# Patient Record
Sex: Female | Born: 1965 | Race: Black or African American | Hispanic: No | Marital: Single | Smoking: Former smoker
Health system: Southern US, Community
[De-identification: ages and names within clinical notes are randomized; demographics above are authoritative.]

## PROBLEM LIST (undated history)

## (undated) DIAGNOSIS — D649 Anemia, unspecified: Secondary | ICD-10-CM

## (undated) DIAGNOSIS — J449 Chronic obstructive pulmonary disease, unspecified: Secondary | ICD-10-CM

## (undated) DIAGNOSIS — F419 Anxiety disorder, unspecified: Secondary | ICD-10-CM

## (undated) DIAGNOSIS — F32A Depression, unspecified: Secondary | ICD-10-CM

## (undated) DIAGNOSIS — E785 Hyperlipidemia, unspecified: Secondary | ICD-10-CM

## (undated) DIAGNOSIS — R51 Headache: Secondary | ICD-10-CM

## (undated) DIAGNOSIS — R519 Headache, unspecified: Secondary | ICD-10-CM

## (undated) DIAGNOSIS — G4730 Sleep apnea, unspecified: Secondary | ICD-10-CM

## (undated) DIAGNOSIS — I10 Essential (primary) hypertension: Secondary | ICD-10-CM

## (undated) HISTORY — PX: BACK SURGERY: SHX140

## (undated) HISTORY — DX: Anxiety disorder, unspecified: F41.9

## (undated) HISTORY — DX: Depression, unspecified: F32.A

## (undated) HISTORY — DX: Hyperlipidemia, unspecified: E78.5

## (undated) HISTORY — PX: DILATION AND CURETTAGE OF UTERUS: SHX78

---

## 2015-06-08 ENCOUNTER — Encounter: Payer: Self-pay | Admitting: *Deleted

## 2015-06-08 ENCOUNTER — Other Ambulatory Visit: Payer: Self-pay

## 2015-06-08 DIAGNOSIS — I10 Essential (primary) hypertension: Secondary | ICD-10-CM

## 2015-06-08 NOTE — Patient Instructions (Addendum)
  Your procedure is scheduled on: 06-19-15 (TUESDAY) Report to [facility] SAME DAY SURGERY 2ND FLOOR To find out your arrival time please call [phone] between 1PM - 3PM on 06-18-15 [facility](MONDAY)  Remember: Instructions that are not followed completely may result in serious medical risk, up to and including death, or upon the discretion of your surgeon and anesthesiologist your surgery may need to be rescheduled.    _X___ 1. Do not eat food or drink liquids after midnight. No gum chewing or hard candies.     _X___ 2. No Alcohol for 24 hours before or after surgery.   ____ 3. Bring all medications with you on the day of surgery if instructed.    _X___ 4. Notify your doctor if there is any change in your medical condition     (cold, fever, infections).     Do not wear jewelry, make-up, hairpins, clips or nail polish.  Do not wear lotions, powders, or perfumes. You may wear deodorant.  Do not shave 48 hours prior to surgery. Men may shave face and neck.  Do not bring valuables to the hospital.    [facility] is not responsible for any belongings or valuables.               Contacts, dentures or bridgework may not be worn into surgery.  Leave your suitcase in the car. After surgery it may be brought to your room.  For patients admitted to the hospital, discharge time is determined by your treatment team.   Patients discharged the day of surgery will not be allowed to drive home.   Please read over the following fact sheets that you were given:      __X__ Take these medicines the morning of surgery with A SIP OF WATER:    1. AMLODIPINE  2. ATORVASTATIN  3. ZOLOFT  4. MAY TAKE OXYCODONE IF NEEDED  5.  6.  ____ Fleet Enema (as directed)   _X___ Use CHG Soap as directed  ____ Use inhalers on the day of surgery  ____ Stop metformin 2 days prior to surgery    ____ Take 1/2 of usual insulin dose the night before surgery and none on the morning of surgery.   ____ Stop  Coumadin/Plavix/aspirin-N/A  ____ Stop Anti-inflammatories-STOP MOTRIN NOW-NO NSAIDS OR ASA PRODUCTS-OXYCODONE OK TO CONTINUE   ____ Stop supplements until after surgery.    _X___ Bring C-Pap to the hospital.

## 2015-06-12 ENCOUNTER — Encounter
Admission: RE | Admit: 2015-06-12 | Discharge: 2015-06-12 | Disposition: A | Discharge status: Home / Self Care | Payer: Medicaid Other | Source: Ambulatory Visit | Attending: Anesthesiology | Admitting: Anesthesiology

## 2015-06-12 DIAGNOSIS — G5601 Carpal tunnel syndrome, right upper limb: Secondary | ICD-10-CM | POA: Insufficient documentation

## 2015-06-12 DIAGNOSIS — Z01818 Encounter for other preprocedural examination: Secondary | ICD-10-CM | POA: Diagnosis present

## 2015-06-12 LAB — POTASSIUM: Potassium: 3.2 mmol/L — ABNORMAL LOW (ref 3.5–5.1)

## 2015-06-15 ENCOUNTER — Other Ambulatory Visit: Payer: Medicaid Other

## 2015-06-15 DIAGNOSIS — Z888 Allergy status to other drugs, medicaments and biological substances status: Secondary | ICD-10-CM | POA: Diagnosis not present

## 2015-06-15 DIAGNOSIS — Z808 Family history of malignant neoplasm of other organs or systems: Secondary | ICD-10-CM | POA: Diagnosis not present

## 2015-06-15 DIAGNOSIS — Z818 Family history of other mental and behavioral disorders: Secondary | ICD-10-CM | POA: Diagnosis not present

## 2015-06-15 DIAGNOSIS — G4730 Sleep apnea, unspecified: Secondary | ICD-10-CM | POA: Diagnosis not present

## 2015-06-15 DIAGNOSIS — M545 Low back pain: Secondary | ICD-10-CM | POA: Diagnosis not present

## 2015-06-15 DIAGNOSIS — Z79899 Other long term (current) drug therapy: Secondary | ICD-10-CM | POA: Diagnosis not present

## 2015-06-15 DIAGNOSIS — Z8249 Family history of ischemic heart disease and other diseases of the circulatory system: Secondary | ICD-10-CM | POA: Diagnosis not present

## 2015-06-15 DIAGNOSIS — Z841 Family history of disorders of kidney and ureter: Secondary | ICD-10-CM | POA: Diagnosis not present

## 2015-06-15 DIAGNOSIS — Z833 Family history of diabetes mellitus: Secondary | ICD-10-CM | POA: Diagnosis not present

## 2015-06-15 DIAGNOSIS — I10 Essential (primary) hypertension: Secondary | ICD-10-CM | POA: Diagnosis not present

## 2015-06-15 DIAGNOSIS — G8929 Other chronic pain: Secondary | ICD-10-CM | POA: Diagnosis not present

## 2015-06-15 DIAGNOSIS — F419 Anxiety disorder, unspecified: Secondary | ICD-10-CM | POA: Diagnosis not present

## 2015-06-15 DIAGNOSIS — Z90710 Acquired absence of both cervix and uterus: Secondary | ICD-10-CM | POA: Diagnosis not present

## 2015-06-15 DIAGNOSIS — D649 Anemia, unspecified: Secondary | ICD-10-CM | POA: Diagnosis not present

## 2015-06-15 DIAGNOSIS — E785 Hyperlipidemia, unspecified: Secondary | ICD-10-CM | POA: Diagnosis not present

## 2015-06-15 DIAGNOSIS — Z981 Arthrodesis status: Secondary | ICD-10-CM | POA: Diagnosis not present

## 2015-06-15 DIAGNOSIS — E669 Obesity, unspecified: Secondary | ICD-10-CM | POA: Diagnosis not present

## 2015-06-15 DIAGNOSIS — Z820 Family history of epilepsy and other diseases of the nervous system: Secondary | ICD-10-CM | POA: Diagnosis not present

## 2015-06-15 DIAGNOSIS — Z6838 Body mass index (BMI) 38.0-38.9, adult: Secondary | ICD-10-CM | POA: Diagnosis not present

## 2015-06-15 DIAGNOSIS — Z800 Family history of malignant neoplasm of digestive organs: Secondary | ICD-10-CM | POA: Diagnosis not present

## 2015-06-15 DIAGNOSIS — Z823 Family history of stroke: Secondary | ICD-10-CM | POA: Diagnosis not present

## 2015-06-15 DIAGNOSIS — E781 Pure hyperglyceridemia: Secondary | ICD-10-CM | POA: Diagnosis not present

## 2015-06-15 DIAGNOSIS — G5601 Carpal tunnel syndrome, right upper limb: Secondary | ICD-10-CM | POA: Diagnosis present

## 2015-06-15 DIAGNOSIS — G43909 Migraine, unspecified, not intractable, without status migrainosus: Secondary | ICD-10-CM | POA: Diagnosis not present

## 2015-06-15 DIAGNOSIS — F329 Major depressive disorder, single episode, unspecified: Secondary | ICD-10-CM | POA: Diagnosis not present

## 2015-06-15 DIAGNOSIS — M1990 Unspecified osteoarthritis, unspecified site: Secondary | ICD-10-CM | POA: Diagnosis not present

## 2015-06-15 NOTE — Pre-Procedure Instructions (Signed)
Faxed over to dr menz's office on 06-13-15  the 3.2 potassium along with calling the office and leaving Hope a message on her voicemail.  The pt said on 06-14-15 that no one has called her regarding her low potassium so I called the office back and spoke with Tiffany who took the message that pts potassium was low and that a supplement needed to be started.  She states she will pass this message on.

## 2015-06-18 ENCOUNTER — Other Ambulatory Visit: Payer: Medicaid Other

## 2015-06-18 NOTE — Pre-Procedure Instructions (Signed)
CALLED DR Maisie FusHOMAS ABOUT ABNORMAL EKG-HE WANTS CARDIAC CLEARANCE-[facility] AND NOTIFIED HER OF THIS-FAXED CLEARANCE NOTE ALONG WITH EKG OVER TO CINDY AND SHE IS GOING TO TRY AND GET [facility]

## 2015-06-19 ENCOUNTER — Encounter: Payer: Self-pay | Admitting: *Deleted

## 2015-06-19 ENCOUNTER — Ambulatory Visit: Payer: Medicaid Other | Admitting: Anesthesiology

## 2015-06-19 ENCOUNTER — Encounter
Admission: RE | Disposition: A | Discharge status: Home / Self Care | Payer: Self-pay | Source: Ambulatory Visit | Attending: Orthopedic Surgery

## 2015-06-19 ENCOUNTER — Ambulatory Visit
Admission: RE | Admit: 2015-06-19 | Discharge: 2015-06-19 | Disposition: A | Discharge status: Home / Self Care | Payer: Medicaid Other | Source: Ambulatory Visit | Attending: Orthopedic Surgery | Admitting: Orthopedic Surgery

## 2015-06-19 DIAGNOSIS — Z79899 Other long term (current) drug therapy: Secondary | ICD-10-CM | POA: Insufficient documentation

## 2015-06-19 DIAGNOSIS — Z888 Allergy status to other drugs, medicaments and biological substances status: Secondary | ICD-10-CM | POA: Insufficient documentation

## 2015-06-19 DIAGNOSIS — M1990 Unspecified osteoarthritis, unspecified site: Secondary | ICD-10-CM | POA: Insufficient documentation

## 2015-06-19 DIAGNOSIS — Z90710 Acquired absence of both cervix and uterus: Secondary | ICD-10-CM | POA: Insufficient documentation

## 2015-06-19 DIAGNOSIS — E669 Obesity, unspecified: Secondary | ICD-10-CM | POA: Insufficient documentation

## 2015-06-19 DIAGNOSIS — G8929 Other chronic pain: Secondary | ICD-10-CM | POA: Insufficient documentation

## 2015-06-19 DIAGNOSIS — I10 Essential (primary) hypertension: Secondary | ICD-10-CM | POA: Insufficient documentation

## 2015-06-19 DIAGNOSIS — E781 Pure hyperglyceridemia: Secondary | ICD-10-CM | POA: Insufficient documentation

## 2015-06-19 DIAGNOSIS — G43909 Migraine, unspecified, not intractable, without status migrainosus: Secondary | ICD-10-CM | POA: Insufficient documentation

## 2015-06-19 DIAGNOSIS — F329 Major depressive disorder, single episode, unspecified: Secondary | ICD-10-CM | POA: Insufficient documentation

## 2015-06-19 DIAGNOSIS — M545 Low back pain: Secondary | ICD-10-CM | POA: Insufficient documentation

## 2015-06-19 DIAGNOSIS — Z8249 Family history of ischemic heart disease and other diseases of the circulatory system: Secondary | ICD-10-CM | POA: Insufficient documentation

## 2015-06-19 DIAGNOSIS — Z833 Family history of diabetes mellitus: Secondary | ICD-10-CM | POA: Insufficient documentation

## 2015-06-19 DIAGNOSIS — E785 Hyperlipidemia, unspecified: Secondary | ICD-10-CM | POA: Insufficient documentation

## 2015-06-19 DIAGNOSIS — F419 Anxiety disorder, unspecified: Secondary | ICD-10-CM | POA: Insufficient documentation

## 2015-06-19 DIAGNOSIS — Z808 Family history of malignant neoplasm of other organs or systems: Secondary | ICD-10-CM | POA: Insufficient documentation

## 2015-06-19 DIAGNOSIS — Z981 Arthrodesis status: Secondary | ICD-10-CM | POA: Insufficient documentation

## 2015-06-19 DIAGNOSIS — Z841 Family history of disorders of kidney and ureter: Secondary | ICD-10-CM | POA: Insufficient documentation

## 2015-06-19 DIAGNOSIS — D649 Anemia, unspecified: Secondary | ICD-10-CM | POA: Insufficient documentation

## 2015-06-19 DIAGNOSIS — Z800 Family history of malignant neoplasm of digestive organs: Secondary | ICD-10-CM | POA: Insufficient documentation

## 2015-06-19 DIAGNOSIS — Z820 Family history of epilepsy and other diseases of the nervous system: Secondary | ICD-10-CM | POA: Insufficient documentation

## 2015-06-19 DIAGNOSIS — G5601 Carpal tunnel syndrome, right upper limb: Secondary | ICD-10-CM | POA: Diagnosis not present

## 2015-06-19 DIAGNOSIS — Z818 Family history of other mental and behavioral disorders: Secondary | ICD-10-CM | POA: Insufficient documentation

## 2015-06-19 DIAGNOSIS — Z823 Family history of stroke: Secondary | ICD-10-CM | POA: Insufficient documentation

## 2015-06-19 DIAGNOSIS — Z6838 Body mass index (BMI) 38.0-38.9, adult: Secondary | ICD-10-CM | POA: Insufficient documentation

## 2015-06-19 DIAGNOSIS — G4730 Sleep apnea, unspecified: Secondary | ICD-10-CM | POA: Insufficient documentation

## 2015-06-19 HISTORY — DX: Essential (primary) hypertension: I10

## 2015-06-19 HISTORY — DX: Anemia, unspecified: D64.9

## 2015-06-19 HISTORY — DX: Headache: R51

## 2015-06-19 HISTORY — DX: Sleep apnea, unspecified: G47.30

## 2015-06-19 HISTORY — DX: Headache, unspecified: R51.9

## 2015-06-19 HISTORY — PX: CARPAL TUNNEL RELEASE: SHX101

## 2015-06-19 LAB — POCT I-STAT 4, (NA,K, GLUC, HGB,HCT)
Glucose, Bld: 88 mg/dL (ref 65–99)
HEMATOCRIT: 39 % (ref 36.0–46.0)
HEMOGLOBIN: 13.3 g/dL (ref 12.0–15.0)
POTASSIUM: 3.5 mmol/L (ref 3.5–5.1)
SODIUM: 140 mmol/L (ref 135–145)

## 2015-06-19 LAB — POCT PREGNANCY, URINE: Preg Test, Ur: NEGATIVE

## 2015-06-19 SURGERY — CARPAL TUNNEL RELEASE
Anesthesia: General | Laterality: Right

## 2015-06-19 MED ORDER — ONDANSETRON HCL 4 MG/2ML IJ SOLN
INTRAMUSCULAR | Status: DC | PRN
  Administered 2015-06-19: 4 mg via INTRAVENOUS

## 2015-06-19 MED ORDER — BUPIVACAINE HCL (PF) 0.5 % IJ SOLN
INTRAMUSCULAR | Status: AC
  Filled 2015-06-19: qty 30

## 2015-06-19 MED ORDER — HYDROCODONE-ACETAMINOPHEN 5-325 MG PO TABS: 1.0000 | ORAL_TABLET | Freq: Four times a day (QID) | ORAL | Status: DC | PRN

## 2015-06-19 MED ORDER — ONDANSETRON HCL 4 MG/2ML IJ SOLN: 4.0000 mg | Freq: Once | INTRAMUSCULAR | Status: DC | PRN

## 2015-06-19 MED ORDER — FENTANYL CITRATE (PF) 100 MCG/2ML IJ SOLN: 25.0000 ug | INTRAMUSCULAR | Status: DC | PRN

## 2015-06-19 MED ORDER — FAMOTIDINE 20 MG PO TABS
ORAL_TABLET | ORAL | Status: AC
  Filled 2015-06-19: qty 1

## 2015-06-19 MED ORDER — LACTATED RINGERS IV SOLN
INTRAVENOUS | Status: DC
Start: 2015-06-19 — End: 2015-06-19
  Administered 2015-06-19 (×2): via INTRAVENOUS

## 2015-06-19 MED ORDER — FENTANYL CITRATE (PF) 100 MCG/2ML IJ SOLN
INTRAMUSCULAR | Status: DC | PRN
  Administered 2015-06-19: 100 ug via INTRAVENOUS

## 2015-06-19 MED ORDER — FAMOTIDINE 20 MG PO TABS
20.0000 mg | ORAL_TABLET | Freq: Once | ORAL | Status: AC
  Administered 2015-06-19: 20 mg via ORAL

## 2015-06-19 MED ORDER — BUPIVACAINE HCL 0.5 % IJ SOLN
INTRAMUSCULAR | Status: DC | PRN
  Administered 2015-06-19: 10 mL

## 2015-06-19 MED ORDER — PROPOFOL 10 MG/ML IV BOLUS
INTRAVENOUS | Status: DC | PRN
  Administered 2015-06-19: 200 mg via INTRAVENOUS

## 2015-06-19 MED ORDER — DEXAMETHASONE SODIUM PHOSPHATE 4 MG/ML IJ SOLN
INTRAMUSCULAR | Status: DC | PRN
  Administered 2015-06-19: 10 mg via INTRAVENOUS

## 2015-06-19 MED ORDER — MIDAZOLAM HCL 2 MG/2ML IJ SOLN
INTRAMUSCULAR | Status: DC | PRN
  Administered 2015-06-19: 2 mg via INTRAVENOUS

## 2015-06-19 SURGICAL SUPPLY — 25 items
BANDAGE ACE 3X5.8 VEL STRL LF (GAUZE/BANDAGES/DRESSINGS) ×3 IMPLANT
BNDG ESMARK 4X12 TAN STRL LF (GAUZE/BANDAGES/DRESSINGS) ×3 IMPLANT
CANISTER SUCT 1200ML W/VALVE (MISCELLANEOUS) ×3 IMPLANT
CHLORAPREP W/TINT 26ML (MISCELLANEOUS) ×3 IMPLANT
ELECT CAUTERY NEEDLE 2.0 MIC (NEEDLE) ×3 IMPLANT
ELECT CAUTERY NEEDLE TIP 1.0 (MISCELLANEOUS) ×3
ELECTRODE CAUTERY NEDL TIP 1.0 (MISCELLANEOUS) ×1 IMPLANT
GAUZE PETRO XEROFOAM 1X8 (MISCELLANEOUS) ×3 IMPLANT
GAUZE SPONGE 4X4 12PLY STRL (GAUZE/BANDAGES/DRESSINGS) ×3 IMPLANT
GLOVE BIOGEL PI IND STRL 9 (GLOVE) ×1 IMPLANT
GLOVE BIOGEL PI INDICATOR 9 (GLOVE) ×2
GLOVE SURG ORTHO 9.0 STRL STRW (GLOVE) ×3 IMPLANT
GOWN SPECIALTY ULTRA XL (MISCELLANEOUS) ×3 IMPLANT
GOWN STRL REUS W/ TWL LRG LVL3 (GOWN DISPOSABLE) ×1 IMPLANT
GOWN STRL REUS W/TWL 2XL LVL3 (GOWN DISPOSABLE) ×3 IMPLANT
GOWN STRL REUS W/TWL LRG LVL3 (GOWN DISPOSABLE) ×2
KIT RM TURNOVER STRD PROC AR (KITS) ×3 IMPLANT
NS IRRIG 500ML POUR BTL (IV SOLUTION) ×3 IMPLANT
PACK EXTREMITY ARMC (MISCELLANEOUS) ×3 IMPLANT
PAD CAST CTTN 4X4 STRL (SOFTGOODS) ×1 IMPLANT
PADDING CAST COTTON 4X4 STRL (SOFTGOODS) ×2
STOCKINETTE STRL 4IN 9604848 (GAUZE/BANDAGES/DRESSINGS) ×3 IMPLANT
SUT ETHILON 4-0 (SUTURE) ×2
SUT ETHILON 4-0 FS2 18XMFL BLK (SUTURE) ×1
SUTURE ETHLN 4-0 FS2 18XMF BLK (SUTURE) ×1 IMPLANT

## 2015-06-19 NOTE — Anesthesia Preprocedure Evaluation (Signed)
Anesthesia Evaluation  Patient identified by MRN, date of birth, ID band Patient awake    Reviewed: Allergy & Precautions, NPO status , Patient's Chart, lab work & pertinent test results  History of Anesthesia Complications Negative for: history of anesthetic complications  Airway Mallampati: III       Dental  (+) Upper Dentures, Lower Dentures   Pulmonary neg pulmonary ROS, sleep apnea and Continuous Positive Airway Pressure Ventilation , Current Smoker,           Cardiovascular hypertension, Pt. on medications      Neuro/Psych  Headaches,    GI/Hepatic negative GI ROS, Neg liver ROS,   Endo/Other  negative endocrine ROS  Renal/GU negative Renal ROS     Musculoskeletal   Abdominal   Peds  Hematology  (+) anemia ,   Anesthesia Other Findings   Reproductive/Obstetrics                             Anesthesia Physical Anesthesia Plan  ASA: III  Anesthesia Plan: General   Post-op Pain Management:    Induction: Intravenous  Airway Management Planned: LMA  Additional Equipment:   Intra-op Plan:   Post-operative Plan:   Informed Consent: I have reviewed the patients History and Physical, chart, labs and discussed the procedure including the risks, benefits and alternatives for the proposed anesthesia with the patient or authorized representative who has indicated his/her understanding and acceptance.     Plan Discussed with:   Anesthesia Plan Comments:         Anesthesia Quick Evaluation

## 2015-06-19 NOTE — Discharge Instructions (Signed)
AMBULATORY SURGERY  DISCHARGE INSTRUCTIONS   1) The drugs that you were given will stay in your system until tomorrow so for the next 24 hours you should not:  A) Drive an automobile B) Make any legal decisions C) Drink any alcoholic beverage   2) You may resume regular meals tomorrow.  Today it is better to start with liquids and gradually work up to solid foods.  You may eat anything you prefer, but it is better to start with liquids, then soup and crackers, and gradually work up to solid foods.   3) Please notify your doctor immediately if you have any unusual bleeding, trouble breathing, redness and pain at the surgery site, drainage, fever, or pain not relieved by medication.    4) Additional Instructions:   Loosen Ace wrap prior to discharge and if fingers swell. Keep remaining bandage in place. Work fingers is much as possible     Please contact your physician with any problems or Same Day Surgery at [phone], Monday through Friday 6 am to 4 pm, or [place] at [facility] Main number at [phone].

## 2015-06-19 NOTE — Anesthesia Postprocedure Evaluation (Signed)
  Anesthesia Post-op Note  Patient: Courtney Carrillo  Procedure(s) Performed: Procedure(s): CARPAL TUNNEL RELEASE (Right)  Anesthesia type:General  Patient location: PACU  Post pain: Pain level controlled  Post assessment: Post-op Vital signs reviewed, Patient's Cardiovascular Status Stable, Respiratory Function Stable, Patent Airway and No signs of Nausea or vomiting  Post vital signs: Reviewed and stable  Last Vitals:  Filed Vitals:   06/19/15 1230  BP: 158/98  Pulse: 51  Temp:   Resp: 16    Level of consciousness: awake, alert  and patient cooperative  Complications: No apparent anesthesia complications

## 2015-06-19 NOTE — Transfer of Care (Signed)
Immediate Anesthesia Transfer of Care Note  Patient: Courtney Carrillo  Procedure(s) Performed: Procedure(s): CARPAL TUNNEL RELEASE (Right)  Patient Location: PACU  Anesthesia Type:General  Level of Consciousness: sedated  Airway & Oxygen Therapy: Patient Spontanous Breathing and Patient connected to face mask oxygen  Post-op Assessment: Report given to RN and Post -op Vital signs reviewed and stable  Post vital signs: Reviewed and stable  Last Vitals:  Filed Vitals:   06/19/15 0905  BP: 153/91  Pulse: 68  Temp: 36.8 C  Resp: 16    Complications: No apparent anesthesia complications

## 2015-06-19 NOTE — Anesthesia Procedure Notes (Signed)
Procedure Name: LMA Insertion Date/Time: 06/19/2015 10:25 AM Performed by: Junious SilkNOLES, Courtney Marcantonio Pre-anesthesia Checklist: Patient identified, Patient being monitored, Timeout performed, Emergency Drugs available and Suction available Patient Re-evaluated:Patient Re-evaluated prior to inductionOxygen Delivery Method: Circle system utilized Preoxygenation: Pre-oxygenation with 100% oxygen Intubation Type: IV induction Ventilation: Mask ventilation without difficulty LMA: LMA inserted Tube type: Oral Number of attempts: 1 Placement Confirmation: positive ETCO2 and breath sounds checked- equal and bilateral Tube secured with: Tape Dental Injury: Teeth and Oropharynx as per pre-operative assessment

## 2015-06-19 NOTE — H&P (Signed)
Reviewed paper H+P, will be scanned into chart. No changes noted.  

## 2015-06-19 NOTE — Op Note (Signed)
06/19/2015  11:03 AM  PATIENT:  Courtney Carrillo  49 y.o. female  PRE-OPERATIVE DIAGNOSIS:  CARPAL TUNNEL SYNDROME right  POST-OPERATIVE DIAGNOSIS:  Same  PROCEDURE:  Procedure(s): CARPAL TUNNEL RELEASE (Right)  SURGEON: Leitha SchullerMichael J Abby Stines, MD  ASSISTANTS: None  ANESTHESIA:   general  EBL:  Total I/O In: 600 [I.V.:600] Out: 5 [Blood:5]  BLOOD ADMINISTERED:none  DRAINS: none   LOCAL MEDICATIONS USED:  MARCAINE     SPECIMEN:  No Specimen  DISPOSITION OF SPECIMEN:  N/A  COUNTS:  YES  TOURNIQUET:   12 minutes at 250 mmHg  IMPLANTS: None  DICTATION: .Dragon Dictation patient brought the operating room and after adequate general anesthesia was obtained, the right arm was prepped and draped in the sterile fashion. After patient identification and timeout procedures were completed tourniquet was raised to her 50 murmurs mercury. Approximately 2 half and a medial incision made in line with ring metacarpal was made with incision down to the transverse carpal ligament. There was an aberrant muscle covering this which was elevated. The transcarpal ligament was and opened and a vascular hemostat placed deep to protect and I structures releases carried distally to there is fat noted around the nerve proximally releases carried out proximally a centimeter proximal to the wrist flexion crease at which point there is good vascular blush compression appeared to be at the level of the wrist flexion crease. There was some ROS constriction of the nerve root good vascularity after release. There no masses within the carpal tunnel is mild flexor tenosynovitis. Wound was irrigated and then infiltrated with 10 cc half percent Sensorcaine for postop analgesia, wound closed with simple interrupted 5-0 nylon skin sutures. Xeroform 4 x 4's web roll and Ace wrap applied  PLAN OF CARE: Discharge to home after PACU  PATIENT DISPOSITION:  PACU - hemodynamically stable.

## 2020-05-29 ENCOUNTER — Other Ambulatory Visit: Payer: Self-pay

## 2020-05-29 ENCOUNTER — Ambulatory Visit: Payer: Self-pay

## 2020-05-29 ENCOUNTER — Ambulatory Visit (LOCAL_COMMUNITY_HEALTH_CENTER): Payer: Medicaid Other

## 2020-05-29 DIAGNOSIS — Z23 Encounter for immunization: Secondary | ICD-10-CM

## 2020-05-29 NOTE — Progress Notes (Signed)
Pt here with son for flu vaccine. Tolerated flu vaccine well today. Updated [facility] copy given. Jerel Shepherd, RN

## 2020-05-29 NOTE — Progress Notes (Addendum)
Pt to be seen by Francis Dowse, RN.

## 2020-08-16 ENCOUNTER — Other Ambulatory Visit: Payer: Self-pay

## 2020-08-16 ENCOUNTER — Ambulatory Visit (INDEPENDENT_AMBULATORY_CARE_PROVIDER_SITE_OTHER): Payer: Medicaid Other | Admitting: Family Medicine

## 2020-08-16 ENCOUNTER — Encounter: Payer: Self-pay | Admitting: Family Medicine

## 2020-08-16 VITALS — BP 158/79 | HR 71 | Temp 97.1°F | Ht 64.0 in | Wt 211.2 lb

## 2020-08-16 DIAGNOSIS — F419 Anxiety disorder, unspecified: Secondary | ICD-10-CM | POA: Diagnosis not present

## 2020-08-16 DIAGNOSIS — I10 Essential (primary) hypertension: Secondary | ICD-10-CM | POA: Diagnosis not present

## 2020-08-16 DIAGNOSIS — R739 Hyperglycemia, unspecified: Secondary | ICD-10-CM

## 2020-08-16 DIAGNOSIS — E785 Hyperlipidemia, unspecified: Secondary | ICD-10-CM | POA: Diagnosis not present

## 2020-08-16 DIAGNOSIS — F32A Depression, unspecified: Secondary | ICD-10-CM | POA: Insufficient documentation

## 2020-08-16 DIAGNOSIS — G8929 Other chronic pain: Secondary | ICD-10-CM | POA: Insufficient documentation

## 2020-08-16 DIAGNOSIS — Z7689 Persons encountering health services in other specified circumstances: Secondary | ICD-10-CM | POA: Insufficient documentation

## 2020-08-16 DIAGNOSIS — M549 Dorsalgia, unspecified: Secondary | ICD-10-CM

## 2020-08-16 DIAGNOSIS — F411 Generalized anxiety disorder: Secondary | ICD-10-CM | POA: Insufficient documentation

## 2020-08-16 MED ORDER — HYDROCHLOROTHIAZIDE 25 MG PO TABS: 25.0000 mg | ORAL_TABLET | Freq: Every day | ORAL | 1 refills | Status: DC

## 2020-08-16 MED ORDER — IBUPROFEN 800 MG PO TABS: 800.0000 mg | ORAL_TABLET | Freq: Three times a day (TID) | ORAL | 0 refills | Status: DC | PRN

## 2020-08-16 MED ORDER — AMLODIPINE BESYLATE 5 MG PO TABS: 5.0000 mg | ORAL_TABLET | Freq: Every day | ORAL | 1 refills | Status: DC

## 2020-08-16 MED ORDER — ATORVASTATIN CALCIUM 40 MG PO TABS: 40.0000 mg | ORAL_TABLET | ORAL | 3 refills | Status: DC

## 2020-08-16 NOTE — Assessment & Plan Note (Signed)
Uncontrolled hypertension.  BP is not at goal < 130/80.  Pt is not working on lifestyle modifications.  Taking medications tolerating well without side effects.  Complications:  HLD, Obesity  Plan: 1. RESTART on amlodipine 5mg  and hydrochlorothiazide 25mg  daily 2. Obtain labs in the next 1-2 [place]  3. Encouraged heart healthy diet and increasing exercise to 30 minutes most days of the week, going no more than 2 days in a row without exercise. 4. Check BP 1-2 x per week at home, keep log, and bring to clinic at next appointment. 5. Follow up 3 months.

## 2020-08-16 NOTE — Assessment & Plan Note (Signed)
Reports previously taking atorvastatin 40mg  daily for cholesterol management.  Will have labs drawn and restart on this prescription.  Sent to pharmacy on file.

## 2020-08-16 NOTE — Assessment & Plan Note (Signed)
New patient establishment at [facility] for primary care.  To have baseline labs drawn and RTC in 3 months for CPE

## 2020-08-16 NOTE — Patient Instructions (Signed)
I have sent in refills to your pharmacy on file.  Try to get exercise a minimum of 30 minutes per day at least 5 days per week as well as  adequate water intake all while measuring blood pressure a few times per week.  Keep a blood pressure log and bring back to clinic at your next visit.  If your readings are consistently over 130/80 to contact our office/send me a MyChart message and we will see you sooner.  Can try DASH and Mediterranean diet options, avoiding processed foods, lowering sodium intake, avoiding pork products, and eating a plant based diet for optimal health.  As we discussed, have your labs drawn in the next 1-2 [place] and we will contact you with the results.  We will plan to see you back in 3 months for your physical  You will receive a survey after today's visit either digitally by e-mail or paper by USPS mail. Your experiences and feedback matter to [place].  Please respond so we know how we are doing as we provide care for you.  Call us with any questions/concerns/needs.  It is my goal to be available to you for your health concerns.  Thanks for choosing me to be a partner in your healthcare needs!  Charlaine Dalton, FNP-C Family Nurse Practitioner [facility] Phone: [phone]

## 2020-08-16 NOTE — Progress Notes (Signed)
Subjective:    Patient ID: Courtney Carrillo, female    DOB: May 14, 1966, 55 y.o.   MRN: [id number]  Courtney Carrillo is a 55 y.o. female presenting on 08/16/2020 for Establish Care (Pt been off bp medication x 4 mths. Pt requesting refills for bpmeds, cholesterol medication and Ibuprofen 800MG  for chronic back pain.  Currently a patient at [facility] they manage her Anxiety & Depression.)   HPI  Courtney Carrillo presents to clinic as a new patient to establish for primary care.  She reports that her previous PCP was at [facility] in [place], [place].  Records will be requested.  Past medical, family, and surgical history reviewed w/ pt.  She has acute concerns today to restart on her medications for hypertension, high cholesterol and ibuprofen.  Reports she has been off of her medications for a few months.    No flowsheet data found.  Social History   Tobacco Use  . Smoking status: Current Every Day Smoker    Packs/day: 0.50    Years: 35.00    Pack years: 17.50    Types: Cigarettes  . Smokeless tobacco: Never Used  Substance Use Topics  . Alcohol use: No  . Drug use: Yes    Types: Marijuana    Review of Systems  Constitutional: Negative.   HENT: Negative.   Eyes: Negative.   Respiratory: Negative.   Cardiovascular: Negative.   Gastrointestinal: Negative.   Endocrine: Negative.   Genitourinary: Negative.   Musculoskeletal: Positive for back pain. Negative for arthralgias, gait problem, joint swelling, myalgias, neck pain and neck stiffness.  Skin: Negative.   Allergic/Immunologic: Negative.   Neurological: Negative.   Hematological: Negative.   Psychiatric/Behavioral: Negative.    Per HPI unless specifically indicated above     Objective:    BP (!) 158/79 (BP Location: Right Arm, Patient Position: Sitting, Cuff Size: Large)   Pulse 71   Temp (!) 97.1 F (36.2 C) (Temporal)   Ht 5\' 4"  (1.626 m)   Wt 211 lb 3.2 oz (95.8 kg)   BMI 36.25 kg/m   Wt  Readings from Last 3 Encounters:  08/16/20 211 lb 3.2 oz (95.8 kg)  06/19/15 230 lb (104.3 kg)    Physical Exam Vitals and nursing note reviewed.  Constitutional:      General: She is not in acute distress.    Appearance: Normal appearance. She is well-developed and well-groomed. She is obese. She is not ill-appearing or toxic-appearing.  HENT:     Head: Normocephalic and atraumatic.     Nose:     Comments: Lesia Sago is in place, covering mouth and nose. Eyes:     General: Lids are normal. Vision grossly intact.        Right eye: No discharge.        Left eye: No discharge.     Extraocular Movements: Extraocular movements intact.     Conjunctiva/sclera: Conjunctivae normal.     Pupils: Pupils are equal, round, and reactive to light.  Cardiovascular:     Rate and Rhythm: Normal rate and regular rhythm.     Pulses: Normal pulses.     Heart sounds: Normal heart sounds. No murmur heard. No friction rub. No gallop.   Pulmonary:     Effort: Pulmonary effort is normal. No respiratory distress.     Breath sounds: Normal breath sounds.  Musculoskeletal:     Lumbar back: No spasms. Negative right straight leg raise test and negative left straight leg  raise test.     Comments: Bilateral paraspinal lumbar tenderness.  Skin:    General: Skin is warm and dry.     Capillary Refill: Capillary refill takes less than 2 seconds.  Neurological:     General: No focal deficit present.     Mental Status: She is alert and oriented to person, place, and time.  Psychiatric:        Attention and Perception: Attention and perception normal.        Mood and Affect: Mood and affect normal.        Speech: Speech normal.        Behavior: Behavior normal. Behavior is cooperative.        Thought Content: Thought content normal.        Cognition and Memory: Cognition and memory normal.        Judgment: Judgment normal.    Results for orders placed or performed during the hospital encounter of 06/19/15   Pregnancy, urine POC  Result Value Ref Range   Preg Test, Ur NEGATIVE NEGATIVE  I-STAT 4, (NA,K, GLUC, HGB,HCT)  Result Value Ref Range   Sodium 140 135 - 145 mmol/L   Potassium 3.5 3.5 - 5.1 mmol/L   Glucose, Bld 88 65 - 99 mg/dL   HCT 62.9 52.8 - 41.3 %   Hemoglobin 13.3 12.0 - 15.0 g/dL      Assessment & Plan:   Problem List Items Addressed This Visit      Cardiovascular and Mediastinum   Essential hypertension    Uncontrolled hypertension.  BP is not at goal < 130/80.  Pt is not working on lifestyle modifications.  Taking medications tolerating well without side effects.  Complications:  HLD, Obesity  Plan: 1. RESTART on amlodipine 5mg  and hydrochlorothiazide 25mg  daily 2. Obtain labs in the next 1-2 [place]  3. Encouraged heart healthy diet and increasing exercise to 30 minutes most days of the week, going no more than 2 days in a row without exercise. 4. Check BP 1-2 x per week at home, keep log, and bring to clinic at next appointment. 5. Follow up 3 months.       Relevant Medications   atorvastatin (LIPITOR) 40 MG tablet   amLODipine (NORVASC) 5 MG tablet   hydrochlorothiazide (HYDRODIURIL) 25 MG tablet   Other Relevant Orders   CBC with Differential   COMPLETE METABOLIC PANEL WITH GFR     Other   Encounter to establish care with new doctor - Primary    New patient establishment at [facility] for primary care.  To have baseline labs drawn and RTC in 3 months for CPE      Chronic bilateral back pain    Chronic bilateral lower back pain that has been treated in the past with ibuprofen 800mg .  Will send rx to pharmacy on file.  To have labs drawn to evaluate kidney function.      Relevant Medications   doxepin (SINEQUAN) 25 MG capsule   ibuprofen (ADVIL) 800 MG tablet   Hyperlipidemia    Reports previously taking atorvastatin 40mg  daily for cholesterol management.  Will have labs drawn and restart on this prescription.  Sent to pharmacy on file.      Relevant  Medications   atorvastatin (LIPITOR) 40 MG tablet   amLODipine (NORVASC) 5 MG tablet   hydrochlorothiazide (HYDRODIURIL) 25 MG tablet   Other Relevant Orders   Lipid Profile   Anxiety    Currently being followed with [facility]  for both anxiety and depression.      Relevant Medications   doxepin (SINEQUAN) 25 MG capsule   busPIRone (BUSPAR) 10 MG tablet   Other Relevant Orders   TSH + free T4    Other Visit Diagnoses    Hyperglycemia       Relevant Orders   HgB A1c      Meds ordered this encounter  Medications  . atorvastatin (LIPITOR) 40 MG tablet    Sig: Take 1 tablet (40 mg total) by mouth every morning.    Dispense:  90 tablet    Refill:  3  . amLODipine (NORVASC) 5 MG tablet    Sig: Take 1 tablet (5 mg total) by mouth daily.    Dispense:  90 tablet    Refill:  1  . ibuprofen (ADVIL) 800 MG tablet    Sig: Take 1 tablet (800 mg total) by mouth every 8 (eight) hours as needed.    Dispense:  30 tablet    Refill:  0  . hydrochlorothiazide (HYDRODIURIL) 25 MG tablet    Sig: Take 1 tablet (25 mg total) by mouth daily.    Dispense:  90 tablet    Refill:  1   Follow up plan: Return in about 3 months (around 11/14/2020) for CPE.   Charlaine Dalton, FNP Family Nurse Practitioner [facility] 08/16/2020, 2:47 PM

## 2020-08-16 NOTE — Assessment & Plan Note (Signed)
Chronic bilateral lower back pain that has been treated in the past with ibuprofen 800mg .  Will send rx to pharmacy on file.  To have labs drawn to evaluate kidney function.

## 2020-08-16 NOTE — Assessment & Plan Note (Signed)
Currently being followed with [facility] for both anxiety and depression.

## 2020-08-18 LAB — COMPLETE METABOLIC PANEL WITH GFR
AG Ratio: 1.6 (calc) (ref 1.0–2.5)
ALT: 11 U/L (ref 6–29)
AST: 16 U/L (ref 10–35)
Albumin: 4.3 g/dL (ref 3.6–5.1)
Alkaline phosphatase (APISO): 64 U/L (ref 37–153)
BUN: 9 mg/dL (ref 7–25)
CO2: 27 mmol/L (ref 20–32)
Calcium: 9.8 mg/dL (ref 8.6–10.4)
Chloride: 106 mmol/L (ref 98–110)
Creat: 0.88 mg/dL (ref 0.50–1.05)
GFR, Est African American: 86 mL/min/{1.73_m2} (ref >=60)
GFR, Est Non African American: 74 mL/min/{1.73_m2} (ref >=60)
Globulin: 2.7 g/dL (calc) (ref 1.9–3.7)
Glucose, Bld: 89 mg/dL (ref 65–99)
Potassium: 4 mmol/L (ref 3.5–5.3)
Sodium: 142 mmol/L (ref 135–146)
Total Bilirubin: 0.4 mg/dL (ref 0.2–1.2)
Total Protein: 7 g/dL (ref 6.1–8.1)

## 2020-08-18 LAB — CBC WITH DIFFERENTIAL/PLATELET
Absolute Monocytes: 474 cells/uL (ref 200–950)
Basophils Absolute: 47 cells/uL (ref 0–200)
Basophils Relative: 0.5 %
Eosinophils Absolute: 251 cells/uL (ref 15–500)
Eosinophils Relative: 2.7 %
HCT: 38.5 % (ref 35.0–45.0)
Hemoglobin: 12.2 g/dL (ref 11.7–15.5)
Lymphs Abs: 3106 cells/uL (ref 850–3900)
MCH: 23.4 pg — ABNORMAL LOW (ref 27.0–33.0)
MCHC: 31.7 g/dL — ABNORMAL LOW (ref 32.0–36.0)
MCV: 73.9 fL — ABNORMAL LOW (ref 80.0–100.0)
MPV: 11.3 fL (ref 7.5–12.5)
Monocytes Relative: 5.1 %
Neutro Abs: 5422 cells/uL (ref 1500–7800)
Neutrophils Relative %: 58.3 %
Platelets: 310 10*3/uL (ref 140–400)
RBC: 5.21 10*6/uL — ABNORMAL HIGH (ref 3.80–5.10)
RDW: 15.5 % — ABNORMAL HIGH (ref 11.0–15.0)
Total Lymphocyte: 33.4 %
WBC: 9.3 10*3/uL (ref 3.8–10.8)

## 2020-08-18 LAB — HEMOGLOBIN A1C
Hgb A1c MFr Bld: 5.5 % of total Hgb (ref <5.7)
Mean Plasma Glucose: 111 mg/dL
eAG (mmol/L): 6.2 mmol/L

## 2020-08-18 LAB — LIPID PANEL
Cholesterol: 208 mg/dL — ABNORMAL HIGH (ref <200)
HDL: 50 mg/dL (ref >=50)
LDL Cholesterol (Calc): 133 mg/dL (calc) — ABNORMAL HIGH
Non-HDL Cholesterol (Calc): 158 mg/dL (calc) — ABNORMAL HIGH (ref <130)
Total CHOL/HDL Ratio: 4.2 (calc) (ref <5.0)
Triglycerides: 133 mg/dL (ref <150)

## 2020-08-18 LAB — TSH+FREE T4: TSH W/REFLEX TO FT4: 1.92 mIU/L

## 2020-12-13 ENCOUNTER — Ambulatory Visit (INDEPENDENT_AMBULATORY_CARE_PROVIDER_SITE_OTHER): Payer: Medicare Other | Admitting: Internal Medicine

## 2020-12-13 ENCOUNTER — Encounter: Payer: Self-pay | Admitting: Internal Medicine

## 2020-12-13 ENCOUNTER — Other Ambulatory Visit (HOSPITAL_COMMUNITY)
Admission: RE | Admit: 2020-12-13 | Discharge: 2020-12-13 | Disposition: A | Discharge status: Home / Self Care | Payer: Medicare Other | Source: Ambulatory Visit | Attending: Internal Medicine | Admitting: Internal Medicine

## 2020-12-13 ENCOUNTER — Other Ambulatory Visit: Payer: Self-pay

## 2020-12-13 VITALS — BP 140/85 | HR 68 | Temp 97.7°F | Resp 18 | Ht 64.0 in | Wt 228.8 lb

## 2020-12-13 DIAGNOSIS — Z114 Encounter for screening for human immunodeficiency virus [HIV]: Secondary | ICD-10-CM

## 2020-12-13 DIAGNOSIS — Z124 Encounter for screening for malignant neoplasm of cervix: Secondary | ICD-10-CM | POA: Insufficient documentation

## 2020-12-13 DIAGNOSIS — F17200 Nicotine dependence, unspecified, uncomplicated: Secondary | ICD-10-CM | POA: Insufficient documentation

## 2020-12-13 DIAGNOSIS — M549 Dorsalgia, unspecified: Secondary | ICD-10-CM

## 2020-12-13 DIAGNOSIS — G8929 Other chronic pain: Secondary | ICD-10-CM

## 2020-12-13 DIAGNOSIS — Z1211 Encounter for screening for malignant neoplasm of colon: Secondary | ICD-10-CM

## 2020-12-13 DIAGNOSIS — Z1231 Encounter for screening mammogram for malignant neoplasm of breast: Secondary | ICD-10-CM

## 2020-12-13 DIAGNOSIS — Z1159 Encounter for screening for other viral diseases: Secondary | ICD-10-CM | POA: Diagnosis not present

## 2020-12-13 DIAGNOSIS — Z0000 Encounter for general adult medical examination without abnormal findings: Secondary | ICD-10-CM

## 2020-12-13 DIAGNOSIS — I10 Essential (primary) hypertension: Secondary | ICD-10-CM

## 2020-12-13 DIAGNOSIS — F419 Anxiety disorder, unspecified: Secondary | ICD-10-CM

## 2020-12-13 DIAGNOSIS — F32A Depression, unspecified: Secondary | ICD-10-CM

## 2020-12-13 DIAGNOSIS — E785 Hyperlipidemia, unspecified: Secondary | ICD-10-CM

## 2020-12-13 DIAGNOSIS — G4733 Obstructive sleep apnea (adult) (pediatric): Secondary | ICD-10-CM

## 2020-12-13 DIAGNOSIS — Z6839 Body mass index (BMI) 39.0-39.9, adult: Secondary | ICD-10-CM

## 2020-12-13 LAB — RESULTS CONSOLE HPV: CHL HPV: NEGATIVE

## 2020-12-13 MED ORDER — IBUPROFEN 800 MG PO TABS
800.0000 mg | ORAL_TABLET | Freq: Three times a day (TID) | ORAL | 0 refills | Status: DC | PRN
Start: 2020-12-13 — End: 2021-01-28

## 2020-12-13 NOTE — Assessment & Plan Note (Signed)
CMET and lipid profile today Encouraged her to consume a low fat diet Continue Atorvastatin 

## 2020-12-13 NOTE — Assessment & Plan Note (Signed)
She will continue to wear her CPAP as instructed Discussed how weight loss can help improve sleep apnea symptoms

## 2020-12-13 NOTE — Assessment & Plan Note (Signed)
Motivated to quit Advised her to discuss with her psychiatrist about the use of Chantix, if they are okay with this, I will prescribe

## 2020-12-13 NOTE — Progress Notes (Signed)
Subjective:    Patient ID: Courtney Carrillo, female    DOB: 02-Mar-1966, 55 y.o.   MRN: [id number]  HPI  Patient presents the clinic today for her Medicare Wellness Exam.  She is establishing care with me today, transferring care from Griffin Basil, NP.  HTN: Her BP today is 140/85, but she reports she did not take her blood pressure medication this morning.  She is taking Amlodipine and HCTZ as prescribed.  ECG from 06/2015 reviewed.  HLD: Her last LDL was 133, triglycerides, 133, 08/2020.  She denies myalgias on Atorvastatin.  She tries to consume a low-fat diet.  Anxiety and Depression: Managed on Vraylar, Cogentin, Doxepin and BuSpar.  She is not currently seeing a therapist.  She follows with psychiatry.  She denies SI/HI.  Chronic Back Pain: Mainly in her low back, s/p fusion in 2002.  There is no imaging of her lumbar spine on file.  She takes Ibuprofen as needed with good relief of symptoms.  OSA: She sleeps well with the use of her CPAP. There is no sleep study on file.  She is interested in smoking cessation. She has been smoking 1/2 ppd for 40 years. She has tried quitting in the past with patches but this was not effective.    Past Medical History:  Diagnosis Date  . Anemia    2015  . Anxiety   . Depression   . Headache    H/O  . Hyperlipidemia   . Hypertension   . Sleep apnea    CPAP    Current Outpatient Medications  Medication Sig Dispense Refill  . amLODipine (NORVASC) 5 MG tablet Take 1 tablet (5 mg total) by mouth daily. 90 tablet 1  . atorvastatin (LIPITOR) 40 MG tablet Take 1 tablet (40 mg total) by mouth every morning. 90 tablet 3  . busPIRone (BUSPAR) 10 MG tablet Take 10 mg by mouth 3 (three) times daily.    Marland Kitchen doxepin (SINEQUAN) 50 MG capsule Take 50 mg by mouth at bedtime.    . hydrochlorothiazide (HYDRODIURIL) 25 MG tablet Take 1 tablet (25 mg total) by mouth daily. 90 tablet 1  . VRAYLAR 6 MG CAPS Take 1 capsule by mouth daily.    . benztropine  (COGENTIN) 1 MG tablet Take 1 mg by mouth every morning.    Marland Kitchen ibuprofen (ADVIL) 800 MG tablet Take 1 tablet (800 mg total) by mouth every 8 (eight) hours as needed. 30 tablet 0  . nicotine (NICODERM CQ - DOSED IN MG/24 HOURS) 21 mg/24hr patch Place onto the skin. (Patient not taking: Reported on 12/13/2020)     No current facility-administered medications for this visit.    Allergies  Allergen Reactions  . Lisinopril Cough  . Losartan Cough  . Other Other (See Comments)    Perfumes : Congestion    Family History  Problem Relation Age of Onset  . Diabetes Mother     Social History   Socioeconomic History  . Marital status: Single    Spouse name: Not on file  . Number of children: Not on file  . Years of education: Not on file  . Highest education level: Not on file  Occupational History  . Not on file  Tobacco Use  . Smoking status: Current Every Day Smoker    Packs/day: 0.50    Years: 35.00    Pack years: 17.50    Types: Cigarettes  . Smokeless tobacco: Never Used  Vaping Use  . Vaping Use: Never  used  Substance and Sexual Activity  . Alcohol use: No  . Drug use: Yes    Types: Marijuana  . Sexual activity: Not on file  Other Topics Concern  . Not on file  Social History Narrative  . Not on file   Social Determinants of Health   Financial Resource Strain: Not on file  Food Insecurity: Not on file  Transportation Needs: Not on file  Physical Activity: Not on file  Stress: Not on file  Social Connections: Not on file  Intimate Partner Violence: Not on file    Hospitiliaztions: None  Health Maintenance:    Flu: 05/2020  Tetanus: 05/2012  COVID: Pfizer x3  Pap smear: more than 5 years ago  Mammogram: 2020, [employer] screening: 06/2017, 3 years, [facility]  Vision screening: annually  Dentist: as needed, dentures   Providers:   PCP: Nicki Reaper, NP  Psychiatry: [facility]   I have personally  reviewed and have noted:  1. The patient's medical and social history 2. Their use of alcohol, tobacco or illicit drugs 3. Their current medications and supplements 4. The patient's functional ability including ADL's, fall risks, home safety risks and hearing or visual impairment. 5. Diet and physical activities 6. Evidence for depression or mood disorder  Subjective:   Review of Systems:   Constitutional: Denies fever, malaise, fatigue, headache or abrupt weight changes.  HEENT: Denies eye pain, eye redness, ear pain, ringing in the ears, wax buildup, runny nose, nasal congestion, bloody nose, or sore throat. Respiratory: Denies difficulty breathing, shortness of breath, cough or sputum production.   Cardiovascular: Denies chest pain, chest tightness, palpitations or swelling in the hands or feet.  Gastrointestinal: Denies abdominal pain, bloating, constipation, diarrhea or blood in the stool.  GU: Denies urgency, frequency, pain with urination, burning sensation, blood in urine, odor or discharge. Musculoskeletal: Pt reports chronic low back pain. Denies decrease in range of motion, difficulty with gait, muscle pain or joint swelling.  Skin: Denies redness, rashes, lesions or ulcercations.  Neurological: Denies dizziness, difficulty with memory, difficulty with speech or problems with balance and coordination.  Psych: Pt has a history of anxiety and depression. Denies anxiety, SI/HI.  No other specific complaints in a complete review of systems (except as listed in HPI above).  Objective:  PE:   BP 140/85 (BP Location: Left Arm, Patient Position: Sitting, Cuff Size: Normal)   Pulse 68   Temp 97.7 F (36.5 C) (Temporal)   Resp 18   Ht 5\' 4"  (1.626 m)   Wt 228 lb 12.8 oz (103.8 kg)   SpO2 100%   BMI 39.27 kg/m  Wt Readings from Last 3 Encounters:  12/13/20 228 lb 12.8 oz (103.8 kg)  08/16/20 211 lb 3.2 oz (95.8 kg)  06/19/15 230 lb (104.3 kg)    General: Appears her  stated age, obese, in NAD. Skin: Warm, dry and intact. No rashes, noted. HEENT: Head: normal shape and size; Eyes: sclera white and EOMs intact;  Neck: Neck supple, trachea midline. No masses, lumps or thyromegaly present.  Cardiovascular: Normal rate and rhythm. S1,S2 noted.  No murmur, rubs or gallops noted. No JVD or BLE edema. No carotid bruits noted. Pulmonary/Chest: Normal effort and positive vesicular breath sounds. No respiratory distress. No wheezes, rales or ronchi noted.  Abdomen: Soft and nontender. Normal bowel sounds. No distention or masses noted. Liver, spleen and kidneys non palpable. Pelvic: Normal female anatomy. Cervix not clearly visualized. Scant amount of  white discharge noted, no odor. Adnexa palpable on the left but not the right. No CMT. Musculoskeletal: Strength 5/5 BUE/BLE. No signs of joint swelling.  Neurological: Alert and oriented. Cranial nerves II-XII grossly intact. Coordination normal.  Psychiatric: Mood and affect normal. Behavior is normal. Judgment and thought content normal.     BMET    Component Value Date/Time   NA 142 08/17/2020 0814   K 4.0 08/17/2020 0814   CL 106 08/17/2020 0814   CO2 27 08/17/2020 0814   GLUCOSE 89 08/17/2020 0814   BUN 9 08/17/2020 0814   CREATININE 0.88 08/17/2020 0814   CALCIUM 9.8 08/17/2020 0814   GFRNONAA 74 08/17/2020 0814   GFRAA 86 08/17/2020 0814    Lipid Panel     Component Value Date/Time   CHOL 208 (H) 08/17/2020 0814   TRIG 133 08/17/2020 0814   HDL 50 08/17/2020 0814   CHOLHDL 4.2 08/17/2020 0814   LDLCALC 133 (H) 08/17/2020 0814    CBC    Component Value Date/Time   WBC 9.3 08/17/2020 0814   RBC 5.21 (H) 08/17/2020 0814   HGB 12.2 08/17/2020 0814   HCT 38.5 08/17/2020 0814   PLT 310 08/17/2020 0814   MCV 73.9 (L) 08/17/2020 0814   MCH 23.4 (L) 08/17/2020 0814   MCHC 31.7 (L) 08/17/2020 0814   RDW 15.5 (H) 08/17/2020 0814   LYMPHSABS 3,106 08/17/2020 0814   EOSABS 251 08/17/2020 0814    BASOSABS 47 08/17/2020 0814    Hgb A1C Lab Results  Component Value Date   HGBA1C 5.5 08/17/2020      Assessment and Plan:   Medicare Annual Wellness Visit:  Diet: She does eat meat. She consumes fruits and veggies. She eats some fried foods. She drinks mostly water, sweet tea. Physical activity: Walking Depression/mood screen: Chronic, PHQ 9 score of 15 Hearing: Intact to whispered voice Visual acuity: Grossly normal, performs annual eye exam  ADLs: Capable Fall risk: None Home safety: Good Cognitive evaluation: Intact to orientation, naming, recall and repetition EOL planning: No adv directives, full code/ I agree  Preventative Medicine: Encouraged her to get a flu shot in the fall. Tetanus due 2023. Covid vaccine UTD. Pap smear today, she declines STD screening. Mammogram ordered- she will call [facility] to schedule. Referral to GI for screening colonoscopy. Encouraged her to consume a balanced diet and exercise regimen. Advised her to see an eye doctor annually, dentist as needed. Will check CBC, CMET, Lipid, HIV and Hep C today. Due dates for screening exam given to patient as part of her AVS.   Next appointment: 1 year, Medicare Wellness Exam    Nicki Reaper, NP This visit occurred during the SARS-CoV-2 public health emergency.  Safety protocols were in place, including screening questions prior to the visit, additional usage of staff PPE, and extensive cleaning of exam room while observing appropriate contact time as indicated for disinfecting solutions.

## 2020-12-13 NOTE — Assessment & Plan Note (Signed)
Discussed chronic use of Ibuprofen had how it can affect the heart, stomach and kidneys Ibuprofen refilled today, use sparingly Encouraged daily stretching, exercise for weight loss and core strengthening CMET today

## 2020-12-13 NOTE — Patient Instructions (Signed)

## 2020-12-13 NOTE — Assessment & Plan Note (Signed)
Elevated today but did not take her medication this am Discussed the importance of her taking her medication as prescribed prior to appts Reinforced DASH diet and exercise for weight loss CMET today Continue Amlodipine and HCTZ Will monitor

## 2020-12-13 NOTE — Assessment & Plan Note (Signed)
Stable on Vraylar, Congentin, Doxepin and Buspar She will continue to follow with psychiatry, will follow Support offered

## 2020-12-14 LAB — COMPREHENSIVE METABOLIC PANEL
AG Ratio: 1.8 (calc) (ref 1.0–2.5)
ALT: 12 U/L (ref 6–29)
AST: 13 U/L (ref 10–35)
Albumin: 4.6 g/dL (ref 3.6–5.1)
Alkaline phosphatase (APISO): 76 U/L (ref 37–153)
BUN: 10 mg/dL (ref 7–25)
CO2: 30 mmol/L (ref 20–32)
Calcium: 9.7 mg/dL (ref 8.6–10.4)
Chloride: 105 mmol/L (ref 98–110)
Creat: 0.76 mg/dL (ref 0.50–1.05)
Globulin: 2.5 g/dL (calc) (ref 1.9–3.7)
Glucose, Bld: 83 mg/dL (ref 65–99)
Potassium: 3.8 mmol/L (ref 3.5–5.3)
Sodium: 143 mmol/L (ref 135–146)
Total Bilirubin: 0.4 mg/dL (ref 0.2–1.2)
Total Protein: 7.1 g/dL (ref 6.1–8.1)

## 2020-12-14 LAB — CBC
HCT: 39.8 % (ref 35.0–45.0)
Hemoglobin: 12.3 g/dL (ref 11.7–15.5)
MCH: 23.3 pg — ABNORMAL LOW (ref 27.0–33.0)
MCHC: 30.9 g/dL — ABNORMAL LOW (ref 32.0–36.0)
MCV: 75.2 fL — ABNORMAL LOW (ref 80.0–100.0)
MPV: 11.1 fL (ref 7.5–12.5)
Platelets: 329 10*3/uL (ref 140–400)
RBC: 5.29 10*6/uL — ABNORMAL HIGH (ref 3.80–5.10)
RDW: 16.2 % — ABNORMAL HIGH (ref 11.0–15.0)
WBC: 8.1 10*3/uL (ref 3.8–10.8)

## 2020-12-14 LAB — LIPID PANEL
Cholesterol: 165 mg/dL (ref <200)
HDL: 52 mg/dL (ref >=50)
LDL Cholesterol (Calc): 93 mg/dL (calc)
Non-HDL Cholesterol (Calc): 113 mg/dL (calc) (ref <130)
Total CHOL/HDL Ratio: 3.2 (calc) (ref <5.0)
Triglycerides: 105 mg/dL (ref <150)

## 2020-12-14 LAB — HIV ANTIBODY (ROUTINE TESTING W REFLEX): HIV 1&2 Ab, 4th Generation: NONREACTIVE

## 2020-12-14 LAB — HEPATITIS C ANTIBODY
Hepatitis C Ab: NONREACTIVE
SIGNAL TO CUT-OFF: 0.01 (ref <1.00)

## 2020-12-14 LAB — CYTOLOGY - PAP
Adequacy: ABSENT
Diagnosis: NEGATIVE

## 2021-01-15 ENCOUNTER — Telehealth (INDEPENDENT_AMBULATORY_CARE_PROVIDER_SITE_OTHER): Payer: Self-pay | Admitting: Gastroenterology

## 2021-01-15 DIAGNOSIS — Z1211 Encounter for screening for malignant neoplasm of colon: Secondary | ICD-10-CM

## 2021-01-15 MED ORDER — NA SULFATE-K SULFATE-MG SULF 17.5-3.13-1.6 GM/177ML PO SOLN: 1.0000 | Freq: Once | ORAL | 0 refills | Status: AC

## 2021-01-15 NOTE — Progress Notes (Signed)
Gastroenterology Pre-Procedure Review  Request Date: 01/28/21 Requesting Physician: Dr. Allegra Lai  PATIENT REVIEW QUESTIONS: The patient responded to the following health history questions as indicated:    1. Are you having any GI issues? no 2. Do you have a personal history of Polyps? 04/14/2017 no polyps removed 3. Do you have a family history of Colon Cancer or Polyps? no 4. Diabetes Mellitus? no 5. Joint replacements in the past 12 months?no 6. Major health problems in the past 3 months?no 7. Any artificial heart valves, MVP, or defibrillator?no    MEDICATIONS & ALLERGIES:    Patient reports the following regarding taking any anticoagulation/antiplatelet therapy:   Plavix, Coumadin, Eliquis, Xarelto, Lovenox, Pradaxa, Brilinta, or Effient? no Aspirin? no  Patient confirms/reports the following medications:  Current Outpatient Medications  Medication Sig Dispense Refill  . amLODipine (NORVASC) 5 MG tablet Take 1 tablet (5 mg total) by mouth daily. 90 tablet 1  . atorvastatin (LIPITOR) 40 MG tablet Take 1 tablet (40 mg total) by mouth every morning. 90 tablet 3  . benztropine (COGENTIN) 1 MG tablet Take 1 mg by mouth every morning.    . busPIRone (BUSPAR) 10 MG tablet Take 10 mg by mouth 3 (three) times daily.    Marland Kitchen doxepin (SINEQUAN) 50 MG capsule Take 50 mg by mouth at bedtime.    . hydrochlorothiazide (HYDRODIURIL) 25 MG tablet Take 1 tablet (25 mg total) by mouth daily. 90 tablet 1  . ibuprofen (ADVIL) 800 MG tablet Take 1 tablet (800 mg total) by mouth every 8 (eight) hours as needed. 30 tablet 0  . VRAYLAR 6 MG CAPS Take 1 capsule by mouth daily.     No current facility-administered medications for this visit.    Patient confirms/reports the following allergies:  Allergies  Allergen Reactions  . Lisinopril Cough  . Losartan Cough  . Other Other (See Comments)    Perfumes : Congestion    No orders of the defined types were placed in this encounter.   AUTHORIZATION  INFORMATION Primary Insurance: 1D#: Group #:  Secondary Insurance: 1D#: Group #:  SCHEDULE INFORMATION: Date: 01/28/21 Time: Location: [facility]

## 2021-01-25 ENCOUNTER — Encounter: Payer: Self-pay | Admitting: Gastroenterology

## 2021-01-28 ENCOUNTER — Ambulatory Visit: Payer: Medicare Other | Admitting: Anesthesiology

## 2021-01-28 ENCOUNTER — Encounter: Payer: Self-pay | Admitting: Gastroenterology

## 2021-01-28 ENCOUNTER — Ambulatory Visit
Admission: RE | Admit: 2021-01-28 | Discharge: 2021-01-28 | Disposition: A | Discharge status: Home / Self Care | Payer: Medicare Other | Attending: Gastroenterology | Admitting: Gastroenterology

## 2021-01-28 ENCOUNTER — Encounter
Admission: RE | Disposition: A | Discharge status: Home / Self Care | Payer: Self-pay | Source: Home / Self Care | Attending: Gastroenterology

## 2021-01-28 DIAGNOSIS — Z888 Allergy status to other drugs, medicaments and biological substances status: Secondary | ICD-10-CM | POA: Diagnosis not present

## 2021-01-28 DIAGNOSIS — Z1211 Encounter for screening for malignant neoplasm of colon: Secondary | ICD-10-CM

## 2021-01-28 DIAGNOSIS — K621 Rectal polyp: Secondary | ICD-10-CM | POA: Diagnosis not present

## 2021-01-28 DIAGNOSIS — Z79899 Other long term (current) drug therapy: Secondary | ICD-10-CM | POA: Insufficient documentation

## 2021-01-28 DIAGNOSIS — K644 Residual hemorrhoidal skin tags: Secondary | ICD-10-CM | POA: Insufficient documentation

## 2021-01-28 DIAGNOSIS — F17210 Nicotine dependence, cigarettes, uncomplicated: Secondary | ICD-10-CM | POA: Diagnosis not present

## 2021-01-28 DIAGNOSIS — Z791 Long term (current) use of non-steroidal anti-inflammatories (NSAID): Secondary | ICD-10-CM | POA: Diagnosis not present

## 2021-01-28 DIAGNOSIS — Z833 Family history of diabetes mellitus: Secondary | ICD-10-CM | POA: Diagnosis not present

## 2021-01-28 HISTORY — PX: COLONOSCOPY WITH PROPOFOL: SHX5780

## 2021-01-28 SURGERY — COLONOSCOPY WITH PROPOFOL
Anesthesia: General

## 2021-01-28 MED ORDER — SODIUM CHLORIDE 0.9 % IV SOLN
INTRAVENOUS | Status: DC
Start: 2021-01-28 — End: 2021-01-28
  Administered 2021-01-28: 1000 mL via INTRAVENOUS

## 2021-01-28 MED ORDER — DEXMEDETOMIDINE (PRECEDEX) IN NS 20 MCG/5ML (4 MCG/ML) IV SYRINGE
PREFILLED_SYRINGE | INTRAVENOUS | Status: AC
  Filled 2021-01-28: qty 5

## 2021-01-28 MED ORDER — MIDAZOLAM HCL 2 MG/2ML IJ SOLN
INTRAMUSCULAR | Status: AC
  Filled 2021-01-28: qty 2

## 2021-01-28 MED ORDER — PROPOFOL 10 MG/ML IV BOLUS
INTRAVENOUS | Status: DC | PRN
  Administered 2021-01-28: 20 mg via INTRAVENOUS

## 2021-01-28 MED ORDER — DEXMEDETOMIDINE (PRECEDEX) IN NS 20 MCG/5ML (4 MCG/ML) IV SYRINGE
PREFILLED_SYRINGE | INTRAVENOUS | Status: DC | PRN
  Administered 2021-01-28: 8 ug via INTRAVENOUS
  Administered 2021-01-28: 12 ug via INTRAVENOUS

## 2021-01-28 MED ORDER — FENTANYL CITRATE (PF) 100 MCG/2ML IJ SOLN
INTRAMUSCULAR | Status: DC | PRN
  Administered 2021-01-28 (×2): 50 ug via INTRAVENOUS

## 2021-01-28 MED ORDER — MIDAZOLAM HCL 2 MG/2ML IJ SOLN
INTRAMUSCULAR | Status: DC | PRN
  Administered 2021-01-28: 2 mg via INTRAVENOUS

## 2021-01-28 MED ORDER — LIDOCAINE HCL (PF) 2 % IJ SOLN
INTRAMUSCULAR | Status: AC
  Filled 2021-01-28: qty 2

## 2021-01-28 MED ORDER — FENTANYL CITRATE (PF) 100 MCG/2ML IJ SOLN
INTRAMUSCULAR | Status: AC
  Filled 2021-01-28: qty 2

## 2021-01-28 MED ORDER — LIDOCAINE HCL (CARDIAC) PF 100 MG/5ML IV SOSY
PREFILLED_SYRINGE | INTRAVENOUS | Status: DC | PRN
  Administered 2021-01-28: 40 mg via INTRAVENOUS

## 2021-01-28 MED ORDER — GLYCOPYRROLATE 0.2 MG/ML IJ SOLN
INTRAMUSCULAR | Status: AC
  Filled 2021-01-28: qty 1

## 2021-01-28 MED ORDER — PROPOFOL 500 MG/50ML IV EMUL
INTRAVENOUS | Status: DC | PRN
  Administered 2021-01-28: 50 ug/kg/min via INTRAVENOUS

## 2021-01-28 MED ORDER — GLYCOPYRROLATE 0.2 MG/ML IJ SOLN
INTRAMUSCULAR | Status: DC | PRN
  Administered 2021-01-28: .2 mg via INTRAVENOUS

## 2021-01-28 NOTE — Anesthesia Postprocedure Evaluation (Signed)
Anesthesia Post Note  Patient: Courtney Carrillo  Procedure(s) Performed: COLONOSCOPY WITH PROPOFOL  Patient location during evaluation: Phase II Anesthesia Type: General Level of consciousness: awake and alert, awake and oriented Pain management: pain level controlled Vital Signs Assessment: post-procedure vital signs reviewed and stable Respiratory status: spontaneous breathing, nonlabored ventilation and respiratory function stable Cardiovascular status: blood pressure returned to baseline and stable Postop Assessment: no apparent nausea or vomiting Anesthetic complications: no   No notable events documented.   Last Vitals:  Vitals:   01/28/21 1105 01/28/21 1125  BP: 119/85 (!) 155/80  Pulse:    Resp:    Temp:    SpO2:      Last Pain:  Vitals:   01/28/21 1125  TempSrc:   PainSc: 0-No pain                 Manfred Arch

## 2021-01-28 NOTE — Anesthesia Preprocedure Evaluation (Signed)
Anesthesia Evaluation  Patient identified by MRN, date of birth, ID band Patient awake    Reviewed: Allergy & Precautions, NPO status , Patient's Chart, lab work & pertinent test results  Airway Mallampati: II  TM Distance: >3 FB Neck ROM: Full    Dental  (+) Edentulous Upper, Edentulous Lower   Pulmonary sleep apnea , Current Smoker and Patient abstained from smoking.,    Pulmonary exam normal        Cardiovascular hypertension, Pt. on medications negative cardio ROS Normal cardiovascular exam     Neuro/Psych  Headaches, PSYCHIATRIC DISORDERS Anxiety Depression    GI/Hepatic negative GI ROS, Neg liver ROS,   Endo/Other  negative endocrine ROS  Renal/GU negative Renal ROS  negative genitourinary   Musculoskeletal negative musculoskeletal ROS (+)   Abdominal   Peds negative pediatric ROS (+)  Hematology negative hematology ROS (+) anemia ,   Anesthesia Other Findings Anemia  2015  Anxiety    Depression    Headache  H/O  Hyperlipidemia    Hypertension    Sleep apnea  CPAP     Reproductive/Obstetrics negative OB ROS                             Anesthesia Physical Anesthesia Plan  ASA: 3  Anesthesia Plan: General   Post-op Pain Management:    Induction: Intravenous  PONV Risk Score and Plan: 2 and Propofol infusion and TIVA  Airway Management Planned: Natural Airway and Nasal Cannula  Additional Equipment:   Intra-op Plan:   Post-operative Plan:   Informed Consent: I have reviewed the patients History and Physical, chart, labs and discussed the procedure including the risks, benefits and alternatives for the proposed anesthesia with the patient or authorized representative who has indicated his/her understanding and acceptance.       Plan Discussed with: CRNA, Anesthesiologist and Surgeon  Anesthesia Plan Comments:         Anesthesia Quick Evaluation

## 2021-01-28 NOTE — H&P (Signed)
Arlyss Repress, MD [address]  [address]  [place], [place] [address]  Main: [phone]  Fax: [phone] Pager: [phone]  Primary Care Physician:  Lorre Munroe, NP Primary Gastroenterologist:  Dr. Arlyss Repress  Pre-Procedure History & Physical: HPI:  Courtney Carrillo is a 55 y.o. female is here for an colonoscopy.   Past Medical History:  Diagnosis Date   Anemia    2015   Anxiety    Depression    Headache    H/O   Hyperlipidemia    Hypertension    Sleep apnea    CPAP    Past Surgical History:  Procedure Laterality Date   BACK SURGERY     LUMBAR   CARPAL TUNNEL RELEASE Right 06/19/2015   Procedure: CARPAL TUNNEL RELEASE;  Surgeon: Kennedy Bucker, MD;  Location: [facility] ORS;  Service: Orthopedics;  Laterality: Right;   DILATION AND CURETTAGE OF UTERUS      Prior to Admission medications   Medication Sig Start Date End Date Taking? Authorizing Provider  amLODipine (NORVASC) 5 MG tablet Take 1 tablet (5 mg total) by mouth daily. 08/16/20  Yes Malfi, Jodelle Gross, FNP  atorvastatin (LIPITOR) 40 MG tablet Take 1 tablet (40 mg total) by mouth every morning. 08/16/20  Yes Malfi, Jodelle Gross, FNP  benztropine (COGENTIN) 1 MG tablet Take 1 mg by mouth every morning. 12/04/20  Yes [provider]  busPIRone (BUSPAR) 10 MG tablet Take 10 mg by mouth 3 (three) times daily. 07/23/20  Yes [provider]  doxepin (SINEQUAN) 50 MG capsule Take 50 mg by mouth at bedtime. 12/04/20  Yes [provider]  hydrochlorothiazide (HYDRODIURIL) 25 MG tablet Take 1 tablet (25 mg total) by mouth daily. 08/16/20  Yes Malfi, Jodelle Gross, FNP  VRAYLAR 6 MG CAPS Take 1 capsule by mouth daily. 07/23/20  Yes [provider]  ibuprofen (ADVIL) 800 MG tablet Take 1 tablet (800 mg total) by mouth every 8 (eight) hours as needed. 12/13/20   Lorre Munroe, NP    Allergies as of 01/15/2021 - Review Complete 01/15/2021  Allergen Reaction Noted   Lisinopril Cough 06/08/2015    Losartan Cough 06/08/2015   Other Other (See Comments) 06/28/2011    Family History  Problem Relation Age of Onset   Diabetes Mother     Social History   Socioeconomic History   Marital status: Single    Spouse name: Not on file   Number of children: Not on file   Years of education: Not on file   Highest education level: Not on file  Occupational History   Not on file  Tobacco Use   Smoking status: Every Day    Packs/day: 0.50    Years: 35.00    Pack years: 17.50    Types: Cigarettes   Smokeless tobacco: Never  Vaping Use   Vaping Use: Never used  Substance and Sexual Activity   Alcohol use: No   Drug use: Yes    Frequency: 7.0 times per week    Types: Marijuana   Sexual activity: Not on file  Other Topics Concern   Not on file  Social History Narrative   Not on file   Social Determinants of Health   Financial Resource Strain: Not on file  Food Insecurity: Not on file  Transportation Needs: Not on file  Physical Activity: Not on file  Stress: Not on file  Social Connections: Not on file  Intimate Partner Violence: Not on file  Review of Systems: See HPI, otherwise negative ROS  Physical Exam: BP (!) 148/82   Pulse 70   Temp (!) 97 F (36.1 C)   Resp 18   Ht 5' 4.5" (1.638 m)   Wt 104.5 kg   SpO2 98%   BMI 38.95 kg/m  General:   Alert,  pleasant and cooperative in NAD Head:  Normocephalic and atraumatic. Neck:  Supple; no masses or thyromegaly. Lungs:  Clear throughout to auscultation.    Heart:  Regular rate and rhythm. Abdomen:  Soft, nontender and nondistended. Normal bowel sounds, without guarding, and without rebound.   Neurologic:  Alert and  oriented x4;  grossly normal neurologically.  Impression/Plan: Courtney Carrillo is here for an colonoscopy to be performed for colon cancer screening  Risks, benefits, limitations, and alternatives regarding  colonoscopy have been reviewed with the patient.  Questions have been answered.  All  parties agreeable.   Lannette Donath, MD  01/28/2021, 9:37 AM

## 2021-01-28 NOTE — Transfer of Care (Signed)
Immediate Anesthesia Transfer of Care Note  Patient: Courtney Carrillo  Procedure(s) Performed: COLONOSCOPY WITH PROPOFOL  Patient Location: PACU  Anesthesia Type:General  Level of Consciousness: sedated  Airway & Oxygen Therapy: Patient Spontanous Breathing and Patient connected to nasal cannula oxygen  Post-op Assessment: Report given to RN and Post -op Vital signs reviewed and stable  Post vital signs: Reviewed and stable  Last Vitals:  Vitals Value Taken Time  BP    Temp    Pulse 89 01/28/21 1055  Resp 12 01/28/21 1055  SpO2 98 % 01/28/21 1055  Vitals shown include unvalidated device data.  Last Pain:  Vitals:   01/28/21 0932  PainSc: 0-No pain         Complications: No notable events documented.

## 2021-01-28 NOTE — Op Note (Signed)
[facility] Patient Name: Courtney Carrillo Procedure Date: 01/28/2021 9:53 AM MRN: [id number] Account #: [id number] Date of Birth: 1966/05/05 Admit Type: Outpatient Age: 55 Room: [facility] ENDO ROOM 1 Gender: Female Note Status: Finalized Procedure:             Colonoscopy Indications:           Screening for colorectal malignant neoplasm Providers:             Toney Reil MD, MD Referring MD:          Lorre Munroe (Referring MD) Medicines:             General Anesthesia Complications:         No immediate complications. Estimated blood loss: None. Procedure:             Pre-Anesthesia Assessment:                        - Prior to the procedure, a History and Physical was                         performed, and patient medications and allergies were                         reviewed. The patient is competent. The risks and                         benefits of the procedure and the sedation options and                         risks were discussed with the patient. All questions                         were answered and informed consent was obtained.                         Patient identification and proposed procedure were                         verified by the physician, the nurse, the                         anesthesiologist, the anesthetist and the technician                         in the pre-procedure area in the procedure room in the                         endoscopy suite. Mental Status Examination: alert and                         oriented. Airway Examination: normal oropharyngeal                         airway and neck mobility. Respiratory Examination:                         clear to auscultation. CV Examination: normal.  Prophylactic Antibiotics: The patient does not require                         prophylactic antibiotics. Prior Anticoagulants: The                         patient has taken no previous  anticoagulant or                         antiplatelet agents. ASA Grade Assessment: III - A                         patient with severe systemic disease. After reviewing                         the risks and benefits, the patient was deemed in                         satisfactory condition to undergo the procedure. The                         anesthesia plan was to use general anesthesia.                         Immediately prior to administration of medications,                         the patient was re-assessed for adequacy to receive                         sedatives. The heart rate, respiratory rate, oxygen                         saturations, blood pressure, adequacy of pulmonary                         ventilation, and response to care were monitored                         throughout the procedure. The physical status of the                         patient was re-assessed after the procedure.                        After obtaining informed consent, the colonoscope was                         passed under direct vision. Throughout the procedure,                         the patient's blood pressure, pulse, and oxygen                         saturations were monitored continuously. The                         Colonoscope was introduced through the anus and  advanced to the the cecum, identified by appendiceal                         orifice and ileocecal valve. The colonoscopy was                         performed with moderate difficulty due to significant                         looping and the patient's body habitus. Successful                         completion of the procedure was aided by applying                         abdominal pressure. The patient tolerated the                         procedure well. The quality of the bowel preparation                         was evaluated using the BBPS [facility] Bowel Preparation                         Scale) with  scores of: Right Colon = 3, Transverse                         Colon = 3 and Left Colon = 3 (entire mucosa seen well                         with no residual staining, small fragments of stool or                         opaque liquid). The total BBPS score equals 9. Findings:      The perianal and digital rectal examinations were normal. Pertinent       negatives include normal sphincter tone and no palpable rectal lesions.      A 20 mm polyp was found in the distal rectum. The polyp was carpet-like,       flat and sessile. Preparations were made for mucosal resection. NBI       using NBI chromoendoscopy technique was done to mark the borders of the       lesion. Eleview was injected with partial lift of the lesion from the       muscularis propria. Snare mucosal resection with suction (via the       working channel) retrieval was performed. A 20 mm area was resected.       Resection and retrieval were complete. There was no bleeding at the end       of the procedure.      Non-bleeding external hemorrhoids were found during retroflexion. The       hemorrhoids were medium-sized. Impression:            - One 20 mm polyp in the distal rectum, removed with                         mucosal resection. Resected and retrieved.                        -  Non-bleeding external hemorrhoids.                        - Mucosal resection was performed. Resection and                         retrieval were complete. Recommendation:        - Discharge patient to home (with escort).                        - Resume previous diet today.                        - Continue present medications.                        - Await pathology results.                        - Perform a flexible sigmoidoscopy to review                         polypectomy site in 3 months.                        - Repeat colonoscopy in 3 years for surveillance based                         on pathology results. Procedure Code(s):     ---  Professional ---                        [phone], Colonoscopy, flexible; with endoscopic mucosal                         resection Diagnosis Code(s):     --- Professional ---                        Z12.11, Encounter for screening for malignant neoplasm                         of colon                        K62.1, Rectal polyp                        K64.4, Residual hemorrhoidal skin tags CPT copyright 2019 American Medical Association. All rights reserved. The codes documented in this report are preliminary and upon coder review may  be revised to meet current compliance requirements. Dr. Libby Maw Toney Reil MD, MD 01/28/2021 10:52:56 AM This report has been signed electronically. Number of Addenda: 0 Note Initiated On: 01/28/2021 9:53 AM Scope Withdrawal Time: 0 hours 34 minutes 34 seconds  Total Procedure Duration: 0 hours 46 minutes 17 seconds  Estimated Blood Loss:  Estimated blood loss: none.      [facility]

## 2021-01-29 ENCOUNTER — Encounter: Payer: Self-pay | Admitting: Gastroenterology

## 2021-01-29 LAB — SURGICAL PATHOLOGY

## 2021-01-30 ENCOUNTER — Other Ambulatory Visit: Payer: Self-pay

## 2021-01-30 ENCOUNTER — Telehealth: Payer: Self-pay

## 2021-01-30 ENCOUNTER — Encounter: Payer: Self-pay | Admitting: Gastroenterology

## 2021-01-30 DIAGNOSIS — K621 Rectal polyp: Secondary | ICD-10-CM

## 2021-01-30 NOTE — Telephone Encounter (Signed)
Called and schedule patient for 04/10/21. Went over instructions and mailed them

## 2021-01-30 NOTE — Telephone Encounter (Signed)
-----   Message from Toney Reil, MD sent at 01/30/2021  9:07 AM EDT ----- Morrie Sheldon  Please schedule flex sig in 3 months Dx: Rectal polyp, piecemeal polypectomy  Rohini Vanga

## 2021-02-15 ENCOUNTER — Other Ambulatory Visit: Payer: Self-pay | Admitting: Internal Medicine

## 2021-02-15 DIAGNOSIS — I10 Essential (primary) hypertension: Secondary | ICD-10-CM

## 2021-02-15 DIAGNOSIS — E785 Hyperlipidemia, unspecified: Secondary | ICD-10-CM

## 2021-02-15 DIAGNOSIS — G8929 Other chronic pain: Secondary | ICD-10-CM

## 2021-02-15 MED ORDER — AMLODIPINE BESYLATE 5 MG PO TABS
5.0000 mg | ORAL_TABLET | Freq: Every day | ORAL | 1 refills | Status: DC
Start: 2021-02-15 — End: 2021-08-10

## 2021-02-15 MED ORDER — ATORVASTATIN CALCIUM 40 MG PO TABS: 40.0000 mg | ORAL_TABLET | ORAL | 1 refills | Status: DC

## 2021-02-15 NOTE — Telephone Encounter (Signed)
Medication: amLODipine (NORVASC) 5 MG tablet [[id number]] , atorvastatin (LIPITOR) 40 MG tablet [[id number]] , ibuprofen (ADVIL) 800 MG tablet [[id number]]  DISCONTINUED  Has the patient contacted their pharmacy? YES  (Agent: If no, request that the patient contact the pharmacy for the refill.) (Agent: If yes, when and what did the pharmacy advise?)  Preferred Pharmacy (with phone number or street name): [id number] [place], [place] - [address] AT [facility] [address] [place] [address] Phone: [phone] Fax: [phone] Hours: Not open 24 hours    Agent: Please be advised that RX refills may take up to 3 business days. We ask that you follow-up with your pharmacy.

## 2021-04-04 ENCOUNTER — Telehealth: Payer: Self-pay | Admitting: Gastroenterology

## 2021-04-04 NOTE — Telephone Encounter (Signed)
Informed patient we had schedule this procedure back in June due to having a rectal polyp and Dr. Allegra Lai wanted to repeat with a Flexsigmoid in 3 months. She states she does have instructions at home and verbalized understanding

## 2021-04-04 NOTE — Telephone Encounter (Signed)
Preservice, Star spoke with patient and Star called our office as this patient has no idea what is going on with her procedure. Please call.

## 2021-04-09 ENCOUNTER — Telehealth: Payer: Self-pay | Admitting: Gastroenterology

## 2021-04-09 ENCOUNTER — Encounter: Payer: Self-pay | Admitting: Gastroenterology

## 2021-04-09 NOTE — Telephone Encounter (Signed)
Gave the patient the instructions once again for the flexsigmoid. She verbalized understanding . Asked what time her procedure is and informed her to call ENDO between 1 and 3 to find out the time of her procedure today

## 2021-04-09 NOTE — Telephone Encounter (Signed)
Pt. Calling back says she forgot what was told to her on yesterday. Requesting a call back

## 2021-04-10 ENCOUNTER — Ambulatory Visit: Payer: Medicare Other | Admitting: Anesthesiology

## 2021-04-10 ENCOUNTER — Ambulatory Visit
Admission: RE | Admit: 2021-04-10 | Discharge: 2021-04-10 | Disposition: A | Discharge status: Home / Self Care | Payer: Medicare Other | Attending: Gastroenterology | Admitting: Gastroenterology

## 2021-04-10 ENCOUNTER — Encounter
Admission: RE | Disposition: A | Discharge status: Home / Self Care | Payer: Self-pay | Source: Home / Self Care | Attending: Gastroenterology

## 2021-04-10 DIAGNOSIS — Z888 Allergy status to other drugs, medicaments and biological substances status: Secondary | ICD-10-CM | POA: Diagnosis not present

## 2021-04-10 DIAGNOSIS — Z09 Encounter for follow-up examination after completed treatment for conditions other than malignant neoplasm: Secondary | ICD-10-CM | POA: Insufficient documentation

## 2021-04-10 DIAGNOSIS — I10 Essential (primary) hypertension: Secondary | ICD-10-CM | POA: Diagnosis not present

## 2021-04-10 DIAGNOSIS — F1290 Cannabis use, unspecified, uncomplicated: Secondary | ICD-10-CM | POA: Insufficient documentation

## 2021-04-10 DIAGNOSIS — Z86010 Personal history of colonic polyps: Secondary | ICD-10-CM | POA: Insufficient documentation

## 2021-04-10 DIAGNOSIS — G4730 Sleep apnea, unspecified: Secondary | ICD-10-CM | POA: Insufficient documentation

## 2021-04-10 DIAGNOSIS — E785 Hyperlipidemia, unspecified: Secondary | ICD-10-CM | POA: Insufficient documentation

## 2021-04-10 DIAGNOSIS — Z9889 Other specified postprocedural states: Secondary | ICD-10-CM | POA: Insufficient documentation

## 2021-04-10 DIAGNOSIS — Z79899 Other long term (current) drug therapy: Secondary | ICD-10-CM | POA: Insufficient documentation

## 2021-04-10 DIAGNOSIS — K621 Rectal polyp: Secondary | ICD-10-CM | POA: Diagnosis not present

## 2021-04-10 DIAGNOSIS — F17210 Nicotine dependence, cigarettes, uncomplicated: Secondary | ICD-10-CM | POA: Insufficient documentation

## 2021-04-10 HISTORY — PX: FLEXIBLE SIGMOIDOSCOPY: SHX5431

## 2021-04-10 SURGERY — SIGMOIDOSCOPY, FLEXIBLE
Anesthesia: General

## 2021-04-10 MED ORDER — PROPOFOL 500 MG/50ML IV EMUL
INTRAVENOUS | Status: AC
  Filled 2021-04-10: qty 50

## 2021-04-10 MED ORDER — PROPOFOL 10 MG/ML IV BOLUS
INTRAVENOUS | Status: DC | PRN
  Administered 2021-04-10: 70 mg via INTRAVENOUS

## 2021-04-10 MED ORDER — PROPOFOL 500 MG/50ML IV EMUL
INTRAVENOUS | Status: DC | PRN
  Administered 2021-04-10: 175 ug/kg/min via INTRAVENOUS

## 2021-04-10 MED ORDER — SODIUM CHLORIDE 0.9 % IV SOLN
INTRAVENOUS | Status: DC
  Administered 2021-04-10: 20 mL/h via INTRAVENOUS

## 2021-04-10 MED ORDER — LIDOCAINE HCL (CARDIAC) PF 100 MG/5ML IV SOSY
PREFILLED_SYRINGE | INTRAVENOUS | Status: DC | PRN
  Administered 2021-04-10: 50 mg via INTRAVENOUS

## 2021-04-10 NOTE — Anesthesia Postprocedure Evaluation (Signed)
Anesthesia Post Note  Patient: Courtney Carrillo  Procedure(s) Performed: FLEXIBLE SIGMOIDOSCOPY  Patient location during evaluation: Endoscopy Anesthesia Type: General Level of consciousness: awake and alert Pain management: pain level controlled Vital Signs Assessment: post-procedure vital signs reviewed and stable Respiratory status: spontaneous breathing, nonlabored ventilation, respiratory function stable and patient connected to nasal cannula oxygen Cardiovascular status: blood pressure returned to baseline and stable Postop Assessment: no apparent nausea or vomiting Anesthetic complications: no   No notable events documented.   Last Vitals:  Vitals:   04/10/21 1013 04/10/21 1216  BP: (!) 135/98 (!) 90/56  Pulse: 68 61  Resp: 20 15  Temp: (!) 36.3 C (!) 35.3 C  SpO2: 98% 99%    Last Pain:  Vitals:   04/10/21 1226  TempSrc:   PainSc: 0-No pain                 Cleda Mccreedy Ernesta Trabert

## 2021-04-10 NOTE — Transfer of Care (Signed)
Immediate Anesthesia Transfer of Care Note  Patient: Courtney Carrillo  Procedure(s) Performed: FLEXIBLE SIGMOIDOSCOPY  Patient Location: PACU  Anesthesia Type:General  Level of Consciousness: awake, alert  and oriented  Airway & Oxygen Therapy: Patient Spontanous Breathing and Patient connected to nasal cannula oxygen  Post-op Assessment: Report given to RN and Post -op Vital signs reviewed and stable  Post vital signs: Reviewed and stable  Last Vitals:  Vitals Value Taken Time  BP 90/56 04/10/21 1216  Temp 35.3 C 04/10/21 1216  Pulse 61 04/10/21 1216  Resp 15 04/10/21 1216  SpO2 99 % 04/10/21 1216    Last Pain:  Vitals:   04/10/21 1216  TempSrc: Temporal  PainSc: Asleep         Complications: No notable events documented.

## 2021-04-10 NOTE — Anesthesia Procedure Notes (Signed)
Date/Time: 04/10/2021 12:03 PM Performed by: Ginger Carne, CRNA Pre-anesthesia Checklist: Patient identified, Emergency Drugs available, Suction available, Patient being monitored and Timeout performed Patient Re-evaluated:Patient Re-evaluated prior to induction Oxygen Delivery Method: Nasal cannula Preoxygenation: Pre-oxygenation with 100% oxygen Induction Type: IV induction

## 2021-04-10 NOTE — Op Note (Signed)
[facility]astroenterology Patient Name: Courtney Carrillo Procedure Date: 04/10/2021 12:03 PM MRN: [id number] Account #: [id number] Date of Birth: 1966-05-02 Admit Type: Outpatient Age: 55 Room: [facility] ENDO ROOM 4 Gender: Female Note Status: Finalized Procedure:             Flexible Sigmoidoscopy Indications:           Surveillance: Personal history of piecemeal removal of                         adenoma on last colonoscopy (less than 6 months ago) Providers:             Toney Reil MD, MD Referring MD:          Lorre Munroe (Referring MD) Medicines:             General Anesthesia Complications:         No immediate complications. Estimated blood loss: None. Procedure:             Pre-Anesthesia Assessment:                        - Prior to the procedure, a History and Physical was                         performed, and patient medications and allergies were                         reviewed. The patient is competent. The risks and                         benefits of the procedure and the sedation options and                         risks were discussed with the patient. All questions                         were answered and informed consent was obtained.                         Patient identification and proposed procedure were                         verified by the physician, the nurse, the                         anesthesiologist, the anesthetist and the technician                         in the pre-procedure area in the procedure room in the                         endoscopy suite. Mental Status Examination: alert and                         oriented. Airway Examination: normal oropharyngeal                         airway and neck mobility. Respiratory Examination:  clear to auscultation. CV Examination: normal.                         Prophylactic Antibiotics: The patient does not require                         prophylactic  antibiotics. Prior Anticoagulants: The                         patient has taken no previous anticoagulant or                         antiplatelet agents. ASA Grade Assessment: III - A                         patient with severe systemic disease. After reviewing                         the risks and benefits, the patient was deemed in                         satisfactory condition to undergo the procedure. The                         anesthesia plan was to use general anesthesia.                         Immediately prior to administration of medications,                         the patient was re-assessed for adequacy to receive                         sedatives. The heart rate, respiratory rate, oxygen                         saturations, blood pressure, adequacy of pulmonary                         ventilation, and response to care were monitored                         throughout the procedure. The physical status of the                         patient was re-assessed after the procedure.                        After obtaining informed consent, the scope was passed                         under direct vision. The Endoscope was introduced                         through the anus and advanced to the the rectum. The                         flexible sigmoidoscopy was accomplished without  difficulty. The patient tolerated the procedure well.                         The quality of the bowel preparation was excellent. Findings:      The perianal and digital rectal examinations were normal. Pertinent       negatives include normal sphincter tone and no palpable rectal lesions.      A 10 mm post polypectomy scar was found in the rectum. The scar tissue       was healthy in appearance. There was no evidence of the previous polyp.      A localized area of mildly nodular mucosa was found in the rectum.       Biopsies were taken with a cold forceps for histology.      Normal  retroflexion Impression:            - Post-polypectomy scar in the rectum.                        - Nodular mucosa in the rectum. Biopsied. Recommendation:        - Discharge patient to home (with escort).                        - Resume previous diet today.                        - Await pathology results.                        - Perform a colonoscopy in 3 years. Procedure Code(s):     --- Professional ---                        [phone], 52, Sigmoidoscopy, flexible; with biopsy,                         single or multiple Diagnosis Code(s):     --- Professional ---                        [phone], Other specified postprocedural states                        K62.89, Other specified diseases of anus and rectum                        Z09, Encounter for follow-up examination after                         completed treatment for conditions other than                         malignant neoplasm                        Z86.010, Personal history of colonic polyps CPT copyright 2019 American Medical Association. All rights reserved. The codes documented in this report are preliminary and upon coder review may  be revised to meet current compliance requirements. Dr. Libby Maw Toney Reil MD, MD 04/10/2021 12:17:21 PM This report has been signed electronically. Number of Addenda: 0 Note Initiated On: 04/10/2021 12:03 PM Total  Procedure Duration: 0 hours 5 minutes 8 seconds  Estimated Blood Loss:  Estimated blood loss: none.      [facility]

## 2021-04-10 NOTE — Anesthesia Preprocedure Evaluation (Signed)
Anesthesia Evaluation  Patient identified by MRN, date of birth, ID band Patient awake    Reviewed: Allergy & Precautions, NPO status , Patient's Chart, lab work & pertinent test results  Airway Mallampati: II  TM Distance: >3 FB Neck ROM: Full    Dental  (+) Upper Dentures, Lower Dentures   Pulmonary sleep apnea , Current Smoker and Patient abstained from smoking.,    Pulmonary exam normal        Cardiovascular hypertension, Pt. on medications negative cardio ROS Normal cardiovascular exam     Neuro/Psych  Headaches, PSYCHIATRIC DISORDERS Anxiety Depression    GI/Hepatic negative GI ROS, Neg liver ROS,   Endo/Other  negative endocrine ROS  Renal/GU negative Renal ROS  negative genitourinary   Musculoskeletal negative musculoskeletal ROS (+)   Abdominal   Peds negative pediatric ROS (+)  Hematology  (+) Blood dyscrasia, anemia ,   Anesthesia Other Findings Anemia  2015  Anxiety    Depression    Headache  H/O  Hyperlipidemia    Hypertension    Sleep apnea  CPAP     Reproductive/Obstetrics negative OB ROS                             Anesthesia Physical  Anesthesia Plan  ASA: 3  Anesthesia Plan: General   Post-op Pain Management:    Induction: Intravenous  PONV Risk Score and Plan: 2 and Propofol infusion and TIVA  Airway Management Planned: Natural Airway and Nasal Cannula  Additional Equipment:   Intra-op Plan:   Post-operative Plan:   Informed Consent: I have reviewed the patients History and Physical, chart, labs and discussed the procedure including the risks, benefits and alternatives for the proposed anesthesia with the patient or authorized representative who has indicated his/her understanding and acceptance.     Dental Advisory Given  Plan Discussed with: CRNA, Anesthesiologist and Surgeon  Anesthesia Plan Comments: (Patient consented for risks of  anesthesia including but not limited to:  - adverse reactions to medications - risk of airway placement if required - damage to eyes, teeth, lips or other oral mucosa - nerve damage due to positioning  - sore throat or hoarseness - Damage to heart, brain, nerves, lungs, other parts of body or loss of life  Patient voiced understanding.)        Anesthesia Quick Evaluation

## 2021-04-10 NOTE — H&P (Signed)
Arlyss Repress, MD [address]  [address]  [place], [place] [address]  Main: [phone]  Fax: [phone] Pager: [phone]  Primary Care Physician:  Lorre Munroe, NP Primary Gastroenterologist:  Dr. Arlyss Repress  Pre-Procedure History & Physical: HPI:  Courtney Carrillo is a 55 y.o. female is here for an flexible sigmoidoscopy.   Past Medical History:  Diagnosis Date   Anemia    2015   Anxiety    Depression    Headache    H/O   Hyperlipidemia    Hypertension    Sleep apnea    CPAP    Past Surgical History:  Procedure Laterality Date   BACK SURGERY     LUMBAR   CARPAL TUNNEL RELEASE Right 06/19/2015   Procedure: CARPAL TUNNEL RELEASE;  Surgeon: Kennedy Bucker, MD;  Location: [facility] ORS;  Service: Orthopedics;  Laterality: Right;   COLONOSCOPY WITH PROPOFOL N/A 01/28/2021   Procedure: COLONOSCOPY WITH PROPOFOL;  Surgeon: Toney Reil, MD;  Location: [facility] ENDOSCOPY;  Service: Gastroenterology;  Laterality: N/A;   DILATION AND CURETTAGE OF UTERUS      Prior to Admission medications   Medication Sig Start Date End Date Taking? Authorizing Provider  amLODipine (NORVASC) 5 MG tablet Take 1 tablet (5 mg total) by mouth daily. 02/15/21  Yes Lorre Munroe, NP  atorvastatin (LIPITOR) 40 MG tablet Take 1 tablet (40 mg total) by mouth every morning. 02/15/21  Yes Baity, Salvadore Oxford, NP  benztropine (COGENTIN) 1 MG tablet Take 1 mg by mouth every morning. 12/04/20  Yes [provider]  busPIRone (BUSPAR) 10 MG tablet Take 10 mg by mouth 3 (three) times daily. 07/23/20  Yes [provider]  doxepin (SINEQUAN) 50 MG capsule Take 50 mg by mouth at bedtime. 12/04/20  Yes [provider]  hydrochlorothiazide (HYDRODIURIL) 25 MG tablet Take 1 tablet (25 mg total) by mouth daily. 08/16/20  Yes Malfi, Jodelle Gross, FNP  VRAYLAR 6 MG CAPS Take 1 capsule by mouth daily. 07/23/20  Yes [provider]    Allergies as of 01/30/2021 - Review Complete  01/28/2021  Allergen Reaction Noted   Lisinopril Cough 06/08/2015   Losartan Cough 06/08/2015   Other Other (See Comments) 06/28/2011    Family History  Problem Relation Age of Onset   Diabetes Mother     Social History   Socioeconomic History   Marital status: Single    Spouse name: Not on file   Number of children: Not on file   Years of education: Not on file   Highest education level: Not on file  Occupational History   Not on file  Tobacco Use   Smoking status: Every Day    Packs/day: 0.50    Years: 35.00    Pack years: 17.50    Types: Cigarettes   Smokeless tobacco: Never  Vaping Use   Vaping Use: Never used  Substance and Sexual Activity   Alcohol use: No   Drug use: Yes    Frequency: 7.0 times per week    Types: Marijuana   Sexual activity: Not on file  Other Topics Concern   Not on file  Social History Narrative   Not on file   Social Determinants of Health   Financial Resource Strain: Not on file  Food Insecurity: Not on file  Transportation Needs: Not on file  Physical Activity: Not on file  Stress: Not on file  Social Connections: Not on file  Intimate Partner Violence: Not  on file    Review of Systems: See HPI, otherwise negative ROS  Physical Exam: BP (!) 135/98   Pulse 68   Temp (!) 97.3 F (36.3 C) (Temporal)   Resp 20   Ht 5\' 4"  (1.626 m)   Wt 104.3 kg   SpO2 98%   BMI 39.48 kg/m  General:   Alert,  pleasant and cooperative in NAD Head:  Normocephalic and atraumatic. Neck:  Supple; no masses or thyromegaly. Lungs:  Clear throughout to auscultation.    Heart:  Regular rate and rhythm. Abdomen:  Soft, nontender and nondistended. Normal bowel sounds, without guarding, and without rebound.   Neurologic:  Alert and  oriented x4;  grossly normal neurologically.  Impression/Plan: Courtney Carrillo is here for an flexible sigmoidoscopy to be performed for h/o rectal adenoma, piecemeal polypectomy  Risks, benefits, limitations, and  alternatives regarding  flexible sigmoidoscopy have been reviewed with the patient.  Questions have been answered.  All parties agreeable.   [name], MD  04/10/2021, 11:21 AM

## 2021-04-11 ENCOUNTER — Encounter: Payer: Self-pay | Admitting: Gastroenterology

## 2021-04-11 LAB — SURGICAL PATHOLOGY

## 2021-04-16 ENCOUNTER — Telehealth: Payer: Self-pay

## 2021-04-16 NOTE — Telephone Encounter (Signed)
Called and left a message for call back  

## 2021-04-16 NOTE — Telephone Encounter (Signed)
-----   Message from Toney Reil, MD sent at 04/13/2021 11:19 AM EDT ----- Please inform patient that the pathology results from rectal biopsies came back normal.  RV

## 2021-04-16 NOTE — Telephone Encounter (Signed)
Patient verbalized understanding of results  

## 2021-05-17 ENCOUNTER — Telehealth: Payer: Self-pay | Admitting: Internal Medicine

## 2021-05-17 NOTE — Telephone Encounter (Signed)
Medication Refill - Medication: 800 mg Ibuprofen  Has the patient contacted their pharmacy? No.  Pt did ot wan to call her pharmacy (Agent: If no, request that the patient contact the n for the refill.) (Agent: If yes, when and what did the pharmacy advise?)  Preferred Pharmacy (with phone number or street name): [facility]  [address]   Has the patient been seen for an appointment in the last year OR does the patient have an upcoming appointment? Yes.    Agent: Please be advised that RX refills may take up to 3 business days. We ask that you follow-up with your pharmacy.

## 2021-05-23 ENCOUNTER — Telehealth: Payer: Self-pay | Admitting: Internal Medicine

## 2021-05-23 NOTE — Telephone Encounter (Signed)
Copied from CRM [id number]. Topic: Quick Communication - Rx Refill/Question >> May 23, 2021  1:07 PM Gaetana Michaelis A wrote: Medication:  800 mg Ibuprofen  Has the patient contacted their pharmacy? No. (Agent: If no, request that the patient contact the pharmacy for the refill.) (Agent: If yes, when and what did the pharmacy advise?)  Preferred Pharmacy (with phone number or street name): [id number] [place], [place] - [address] AT [facility] [address]  Phone:  [phone] Fax:  [phone]  Has the patient been seen for an appointment in the last year OR does the patient have an upcoming appointment? Yes.    Agent: Please be advised that RX refills may take up to 3 business days. We ask that you follow-up with your pharmacy.

## 2021-05-23 NOTE — Telephone Encounter (Signed)
The pt called requesting Ibuprofen 800MG  for chronic back pain.

## 2021-05-26 MED ORDER — IBUPROFEN 800 MG PO TABS: 800.0000 mg | ORAL_TABLET | Freq: Three times a day (TID) | ORAL | 2 refills | Status: DC | PRN

## 2021-05-26 NOTE — Telephone Encounter (Cosign Needed)
RX sent to pharmacy  

## 2021-06-03 ENCOUNTER — Ambulatory Visit (INDEPENDENT_AMBULATORY_CARE_PROVIDER_SITE_OTHER): Payer: Medicare Other

## 2021-06-03 ENCOUNTER — Other Ambulatory Visit: Payer: Self-pay

## 2021-06-03 DIAGNOSIS — Z23 Encounter for immunization: Secondary | ICD-10-CM | POA: Diagnosis not present

## 2021-08-10 ENCOUNTER — Other Ambulatory Visit: Payer: Self-pay | Admitting: Internal Medicine

## 2021-08-10 DIAGNOSIS — I10 Essential (primary) hypertension: Secondary | ICD-10-CM

## 2021-08-10 DIAGNOSIS — E785 Hyperlipidemia, unspecified: Secondary | ICD-10-CM

## 2021-08-10 NOTE — Telephone Encounter (Signed)
Called pt make appt for F/U BP- pt stated she will back   Requested Prescriptions  Pending Prescriptions Disp Refills   amLODipine (NORVASC) 5 MG tablet [Pharmacy Med Name: AMLODIPINE BESYLATE 5MG  TABLETS] 30 tablet 0    Sig: TAKE 1 TABLET(5 MG) BY MOUTH DAILY     Cardiovascular:  Calcium Channel Blockers Failed - 08/10/2021 10:21 AM      Failed - Valid encounter within last 6 months    Recent Outpatient Visits          8 months ago Medicare annual wellness visit, subsequent   [facility], Mullins, NP   11 months ago Encounter to establish care with new doctor   [facility], [facility], Jodelle Gross             Passed - Last BP in normal range    BP Readings from Last 1 Encounters:  04/10/21 (P) 134/75          atorvastatin (LIPITOR) 40 MG tablet [Pharmacy Med Name: ATORVASTATIN 40MG  TABLETS] 90 tablet 1    Sig: TAKE 1 TABLET(40 MG) BY MOUTH EVERY MORNING     Cardiovascular:  Antilipid - Statins Passed - 08/10/2021 10:21 AM      Passed - Total Cholesterol in normal range and within 360 days    Cholesterol  Date Value Ref Range Status  12/13/2020 165 <200 mg/dL Final         Passed - LDL in normal range and within 360 days    LDL Cholesterol (Calc)  Date Value Ref Range Status  12/13/2020 93 mg/dL (calc) Final    Comment:    Reference range: <100 . Desirable range <100 mg/dL for primary prevention;   <70 mg/dL for patients with CHD or diabetic patients  with > or = 2 CHD risk factors. 02/12/2021 LDL-C is now calculated using the Martin-Hopkins  calculation, which is a validated novel method providing  better accuracy than the Friedewald equation in the  estimation of LDL-C.  02/12/2021 et al. Marland Kitchen. Horald Pollen): 2061-2068  (http://education.[employer].com/faq/FAQ164)          Passed - HDL in normal range and within 360 days    HDL  Date Value Ref Range Status  12/13/2020 52 > OR = 50 mg/dL Final         Passed -  Triglycerides in normal range and within 360 days    Triglycerides  Date Value Ref Range Status  12/13/2020 105 <150 mg/dL Final         Passed - Patient is not pregnant      Passed - Valid encounter within last 12 months    Recent Outpatient Visits          8 months ago Medicare annual wellness visit, subsequent   [facility], [facility], NP   11 months ago Encounter to establish care with new doctor   [facility], Salvadore [place], [facility]

## 2021-08-10 NOTE — Telephone Encounter (Signed)
ordered today 08/10/21 by Nicki Reaper NP  Requested Prescriptions  Refused Prescriptions Disp Refills   amLODipine (NORVASC) 5 MG tablet [Pharmacy Med Name: AMLODIPINE BESYLATE 5MG  TABLETS] 90 tablet     Sig: TAKE 1 TABLET(5 MG) BY MOUTH DAILY     Cardiovascular:  Calcium Channel Blockers Failed - 08/10/2021  2:37 PM      Failed - Valid encounter within last 6 months    Recent Outpatient Visits          8 months ago Medicare annual wellness visit, subsequent   [facility], Mullins, NP   11 months ago Encounter to establish care with new doctor   [facility], [facility], Jodelle Gross             Passed - Last BP in normal range    BP Readings from Last 1 Encounters:  04/10/21 (P) 134/75

## 2021-09-21 ENCOUNTER — Other Ambulatory Visit: Payer: Self-pay | Admitting: Internal Medicine

## 2021-09-21 DIAGNOSIS — I10 Essential (primary) hypertension: Secondary | ICD-10-CM

## 2021-09-23 NOTE — Telephone Encounter (Signed)
Requested medications are due for refill today.  yes  Requested medications are on the active medications list.  yes  Last refill. 08/10/2021 #30 0 refills  - courtesy refill  Future visit scheduled.   no  Notes to clinic.  Pt last seen 12/13/2020. Courtesy refill already given. More than 3 months overdue for OV.    Requested Prescriptions  Pending Prescriptions Disp Refills   amLODipine (NORVASC) 5 MG tablet [Pharmacy Med Name: AMLODIPINE BESYLATE 5MG  TABLETS] 90 tablet     Sig: TAKE 1 TABLET(5 MG) BY MOUTH DAILY     Cardiovascular: Calcium Channel Blockers 2 Failed - 09/21/2021 10:29 AM      Failed - Valid encounter within last 6 months    Recent Outpatient Visits           9 months ago Medicare annual wellness visit, subsequent   [facility] [place], Mullins, NP   1 year ago Encounter to establish care with new doctor   [facility], [facility], Jodelle Gross              Passed - Last BP in normal range    BP Readings from Last 1 Encounters:  04/10/21 (P) 134/75          Passed - Last Heart Rate in normal range    Pulse Readings from Last 1 Encounters:  04/10/21 61

## 2021-11-21 ENCOUNTER — Other Ambulatory Visit: Payer: Self-pay | Admitting: Internal Medicine

## 2021-11-21 DIAGNOSIS — I10 Essential (primary) hypertension: Secondary | ICD-10-CM

## 2021-11-21 NOTE — Telephone Encounter (Signed)
Requested Prescriptions  ?Pending Prescriptions Disp Refills  ?? amLODipine (NORVASC) 5 MG tablet [Pharmacy Med Name: AMLODIPINE BESYLATE 5MG  TABLETS] 90 tablet 0  ?  Sig: TAKE 1 TABLET(5 MG) BY MOUTH DAILY  ?  ? Cardiovascular: Calcium Channel Blockers 2 Failed - 11/21/2021  3:24 PM  ?  ?  Failed - Valid encounter within last 6 months  ?  Recent Outpatient Visits   ?      ? 11 months ago Medicare annual wellness visit, subsequent  ? [facility] [place], Mullins, NP  ? 1 year ago Encounter to establish care with new doctor  ? [facility], [facility], FNP  ?  ?  ?Future Appointments   ?        ? In 3 Hal[place] Baity, Jodelle Gross, NP [facility]  ?  ? ?  ?  ?  Passed - Last BP in normal range  ?  BP Readings from Last 1 Encounters:  ?04/10/21 (P) 134/75  ?   ?  ?  Passed - Last Heart Rate in normal range  ?  Pulse Readings from Last 1 Encounters:  ?04/10/21 61  ?   ?  ?  ? ? ?

## 2021-12-16 ENCOUNTER — Encounter: Payer: Self-pay | Admitting: Internal Medicine

## 2021-12-16 ENCOUNTER — Ambulatory Visit (INDEPENDENT_AMBULATORY_CARE_PROVIDER_SITE_OTHER): Payer: Medicare Other

## 2021-12-16 ENCOUNTER — Ambulatory Visit (INDEPENDENT_AMBULATORY_CARE_PROVIDER_SITE_OTHER): Payer: Medicare Other | Admitting: Internal Medicine

## 2021-12-16 VITALS — BP 128/80 | HR 78 | Temp 97.3°F | Ht 63.5 in | Wt 222.0 lb

## 2021-12-16 VITALS — BP 128/80 | HR 78 | Temp 97.3°F | Resp 17 | Ht 63.5 in | Wt 222.2 lb

## 2021-12-16 DIAGNOSIS — Z0000 Encounter for general adult medical examination without abnormal findings: Secondary | ICD-10-CM

## 2021-12-16 DIAGNOSIS — E66812 Obesity, class 2: Secondary | ICD-10-CM | POA: Insufficient documentation

## 2021-12-16 DIAGNOSIS — M545 Low back pain, unspecified: Secondary | ICD-10-CM

## 2021-12-16 DIAGNOSIS — Z6837 Body mass index (BMI) 37.0-37.9, adult: Secondary | ICD-10-CM | POA: Insufficient documentation

## 2021-12-16 DIAGNOSIS — E66813 Body mass index (BMI) 40.0-44.9, adult: Secondary | ICD-10-CM | POA: Insufficient documentation

## 2021-12-16 DIAGNOSIS — G4733 Obstructive sleep apnea (adult) (pediatric): Secondary | ICD-10-CM | POA: Diagnosis not present

## 2021-12-16 DIAGNOSIS — R7309 Other abnormal glucose: Secondary | ICD-10-CM

## 2021-12-16 DIAGNOSIS — I10 Essential (primary) hypertension: Secondary | ICD-10-CM | POA: Diagnosis not present

## 2021-12-16 DIAGNOSIS — Z6841 Body Mass Index (BMI) 40.0 and over, adult: Secondary | ICD-10-CM | POA: Insufficient documentation

## 2021-12-16 DIAGNOSIS — E782 Mixed hyperlipidemia: Secondary | ICD-10-CM

## 2021-12-16 DIAGNOSIS — F32A Depression, unspecified: Secondary | ICD-10-CM

## 2021-12-16 DIAGNOSIS — Z6838 Body mass index (BMI) 38.0-38.9, adult: Secondary | ICD-10-CM

## 2021-12-16 DIAGNOSIS — Z1231 Encounter for screening mammogram for malignant neoplasm of breast: Secondary | ICD-10-CM | POA: Diagnosis not present

## 2021-12-16 DIAGNOSIS — F419 Anxiety disorder, unspecified: Secondary | ICD-10-CM

## 2021-12-16 DIAGNOSIS — E6609 Other obesity due to excess calories: Secondary | ICD-10-CM | POA: Insufficient documentation

## 2021-12-16 DIAGNOSIS — G8929 Other chronic pain: Secondary | ICD-10-CM

## 2021-12-16 MED ORDER — IBUPROFEN 800 MG PO TABS: 800.0000 mg | ORAL_TABLET | Freq: Three times a day (TID) | ORAL | 2 refills | Status: DC | PRN

## 2021-12-16 NOTE — Assessment & Plan Note (Signed)
Encouraged regular stretching ?Encouraged weight loss as this can help reduce back pain ?

## 2021-12-16 NOTE — Patient Instructions (Signed)
Gallbladder Eating Plan High blood cholesterol, obesity, a sedentary lifestyle, an unhealthy diet, and diabetes are risk factors for developing gallstones. If you have a gallbladder condition, you may have trouble digesting fats and tolerating high fat intake. Eating a low-fat diet can help reduce your symptoms and may be helpful before and after having surgery to remove your gallbladder (cholecystectomy). Your health care provider may recommend that you work with a dietitian to help you reduce the amount of fat in your diet. What are tips for following this plan? General guidelines Limit your fat intake to less than 30% of your total daily calories. If you eat around 1,800 calories each day, this means eating less than 60 grams (g) of fat per day. Fat is an important part of a healthy diet. Eating a low-fat diet can make it hard to maintain a healthy body weight. Ask your dietitian how much fat, calories, and other nutrients you need each day. Eat small, frequent meals throughout the day instead of three large meals. Drink at least 8-10 cups (1.9-2.4 L) of fluid a day. Drink enough fluid to keep your urine pale yellow. If you drink alcohol: Limit how much you have to: 0-1 drink a day for women who are not pregnant. 0-2 drinks a day for men. Know how much alcohol is in a drink. In the [place] one drink equals one 12 oz bottle of beer (355 mL), one 5 oz glass of wine (148 mL), or one 1 oz glass of hard liquor (44 mL). Reading food labels  Check nutrition facts on food labels for the amount of fat per serving. Choose foods with less than 3 grams of fat per serving. Shopping Choose nonfat and low-fat healthy foods. Look for the words "nonfat," "low-fat," or "fat-free." Avoid buying processed or prepackaged foods. Cooking Cook using low-fat methods, such as baking, broiling, grilling, or boiling. Cook with small amounts of healthy fats, such as olive oil, grapeseed oil, canola oil, avocado oil, or  sunflower oil. What foods are recommended?  All fresh, frozen, or canned fruits and vegetables. Whole grains. Low-fat or nonfat (skim) milk and yogurt. Lean meat, skinless poultry, fish, eggs, and beans. Low-fat protein supplement powders or drinks. Spices and herbs. The items listed above may not be a complete list of foods and beverages you can eat and drink. Contact a dietitian for more information. What foods are not recommended? High-fat foods. These include baked goods, fast food, fatty cuts of meat, ice cream, french toast, sweet rolls, pizza, cheese bread, foods covered with butter, creamy sauces, or cheese. Fried foods. These include french fries, tempura, battered fish, breaded chicken, fried breads, and sweets. Foods that cause bloating and gas. The items listed above may not be a complete list of foods that you should avoid. Contact a dietitian for more information. Summary A low-fat diet can be helpful if you have a gallbladder condition, or before and after gallbladder surgery. Limit your fat intake to less than 30% of your total daily calories. This is about 60 g of fat if you eat 1,800 calories each day. Eat small, frequent meals throughout the day instead of three large meals. This information is not intended to replace advice given to you by your health care provider. Make sure you discuss any questions you have with your health care provider. Document Revised: 07/12/2021 Document Reviewed: 07/12/2021 Elsevier Patient Education  2023 Elsevier Inc.  

## 2021-12-16 NOTE — Patient Instructions (Signed)
Health Maintenance, Female ?Adopting a healthy lifestyle and getting preventive care are important in promoting health and wellness. Ask your health care provider about: ?The right schedule for you to have regular tests and exams. ?Things you can do on your own to prevent diseases and keep yourself healthy. ?What should I know about diet, weight, and exercise? ?Eat a healthy diet ? ?Eat a diet that includes plenty of vegetables, fruits, low-fat dairy products, and lean protein. ?Do not eat a lot of foods that are high in solid fats, added sugars, or sodium. ?Maintain a healthy weight ?Body mass index (BMI) is used to identify weight problems. It estimates body fat based on height and weight. Your health care provider can help determine your BMI and help you achieve or maintain a healthy weight. ?Get regular exercise ?Get regular exercise. This is one of the most important things you can do for your health. Most adults should: ?Exercise for at least 150 minutes each week. The exercise should increase your heart rate and make you sweat (moderate-intensity exercise). ?Do strengthening exercises at least twice a week. This is in addition to the moderate-intensity exercise. ?Spend less time sitting. Even light physical activity can be beneficial. ?Watch cholesterol and blood lipids ?Have your blood tested for lipids and cholesterol at 56 years of age, then have this test every 5 years. ?Have your cholesterol levels checked more often if: ?Your lipid or cholesterol levels are high. ?You are older than 56 years of age. ?You are at high risk for heart disease. ?What should I know about cancer screening? ?Depending on your health history and family history, you may need to have cancer screening at various ages. This may include screening for: ?Breast cancer. ?Cervical cancer. ?Colorectal cancer. ?Skin cancer. ?Lung cancer. ?What should I know about heart disease, diabetes, and high blood pressure? ?Blood pressure and heart  disease ?High blood pressure causes heart disease and increases the risk of stroke. This is more likely to develop in people who have high blood pressure readings or are overweight. ?Have your blood pressure checked: ?Every 3-5 years if you are 19-36 years of age. ?Every year if you are 48 years old or older. ?Diabetes ?Have regular diabetes screenings. This checks your fasting blood sugar level. Have the screening done: ?Once every three years after age 18 if you are at a normal weight and have a low risk for diabetes. ?More often and at a younger age if you are overweight or have a high risk for diabetes. ?What should I know about preventing infection? ?Hepatitis B ?If you have a higher risk for hepatitis B, you should be screened for this virus. Talk with your health care provider to find out if you are at risk for hepatitis B infection. ?Hepatitis C ?Testing is recommended for: ?Everyone born from [birth year] through 1965. ?Anyone with known risk factors for hepatitis C. ?Sexually transmitted infections (STIs) ?Get screened for STIs, including gonorrhea and chlamydia, if: ?You are sexually active and are younger than 56 years of age. ?You are older than 56 years of age and your health care provider tells you that you are at risk for this type of infection. ?Your sexual activity has changed since you were last screened, and you are at increased risk for chlamydia or gonorrhea. Ask your health care provider if you are at risk. ?Ask your health care provider about whether you are at high risk for HIV. Your health care provider may recommend a prescription medicine to help prevent HIV  infection. If you choose to take medicine to prevent HIV, you should first get tested for HIV. You should then be tested every 3 months for as long as you are taking the medicine. ?Pregnancy ?If you are about to stop having your period (premenopausal) and you may become pregnant, seek counseling before you get pregnant. ?Take 400 to 800  micrograms (mcg) of folic acid every day if you become pregnant. ?Ask for birth control (contraception) if you want to prevent pregnancy. ?Osteoporosis and menopause ?Osteoporosis is a disease in which the bones lose minerals and strength with aging. This can result in bone fractures. If you are 90 years old or older, or if you are at risk for osteoporosis and fractures, ask your health care provider if you should: ?Be screened for bone loss. ?Take a calcium or vitamin D supplement to lower your risk of fractures. ?Be given hormone replacement therapy (HRT) to treat symptoms of menopause. ?Follow these instructions at home: ?Alcohol use ?Do not drink alcohol if: ?Your health care provider tells you not to drink. ?You are pregnant, may be pregnant, or are planning to become pregnant. ?If you drink alcohol: ?Limit how much you have to: ?0-1 drink a day. ?Know how much alcohol is in your drink. In the [place] one drink equals one 12 oz bottle of beer (355 mL), one 5 oz glass of wine (148 mL), or one 1? oz glass of hard liquor (44 mL). ?Lifestyle ?Do not use any products that contain nicotine or tobacco. These products include cigarettes, chewing tobacco, and vaping devices, such as e-cigarettes. If you need help quitting, ask your health care provider. ?Do not use street drugs. ?Do not share needles. ?Ask your health care provider for help if you need support or information about quitting drugs. ?General instructions ?Schedule regular health, dental, and eye exams. ?Stay current with your vaccines. ?Tell your health care provider if: ?You often feel depressed. ?You have ever been abused or do not feel safe at home. ?Summary ?Adopting a healthy lifestyle and getting preventive care are important in promoting health and wellness. ?Follow your health care provider's instructions about healthy diet, exercising, and getting tested or screened for diseases. ?Follow your health care provider's instructions on monitoring your  cholesterol and blood pressure. ?This information is not intended to replace advice given to you by your health care provider. Make sure you discuss any questions you have with your health care provider. ?Document Revised: 12/17/2020 Document Reviewed: 12/17/2020 ?Elsevier Patient Education ? 2023 Elsevier Inc. ? ?Mammogram ?A mammogram is an X-ray of the breasts. This procedure can screen for and detect any changes that may indicate breast cancer. Mammograms are regularly done beginning at age 43 for women with average risk. A man may have a mammogram if he has a lump or swelling in his breast tissue. ?A mammogram can also identify other changes and variations in the breast, such as: ?Inflammation of the breast tissue (mastitis). ?An infected area that contains a collection of pus (abscess). ?A fluid-filled sac (cyst). ?Tumors that are not cancerous (benign). ?Fibrocystic changes. This is when breast tissue becomes denser and can make the tissue feel rope-like or uneven under the skin. ?Women at higher risk for breast cancer need earlier and more comprehensive screening for abnormal changes. Breast tomosynthesis, or three-dimensional (3D) mammography, and digital breast tomosynthesis are advanced forms of imaging that create 3D pictures of the breasts. ?Tell a health care provider: ?About any allergies you have. ?If you have breast implants. ?If  you have had previous breast disease, biopsy, or surgery. ?If you have a family history of breast cancer. ?If you are breastfeeding. ?Whether you are pregnant or may be pregnant. ?What are the risks? ?Generally, this is a safe procedure. However, problems may occur, including: ?Exposure to radiation. Radiation levels are very low with this test. ?The need for more tests. ?The mammogram fails to detect certain cancers or the results are misinterpreted. ?Difficulty with detecting breast cancer in women with dense breasts. ?What happens before the procedure? ?Schedule your  test about 1-2 Hym[place] after your menstrual period if you are menstruating. This is usually when your breasts are the least tender. ?If you have had a mammogram done at a different facility in the past, get the ma

## 2021-12-16 NOTE — Progress Notes (Signed)
? ?Subjective:  ? Courtney Carrillo is a 56 y.o. female who presents for Medicare Annual (Subsequent) preventive examination. ? ?Review of Systems    ?Per HPI unless specifically indicated below  ?  ? ?   ?Objective:  ?  ?Today's Vitals  ? 12/16/21 1334  ?BP: 128/80  ?Pulse: 78  ?Resp: 17  ?Temp: (!) 97.3 ?F (36.3 ?C)  ?TempSrc: Temporal  ?SpO2: 99%  ?Weight: 222 lb 3.2 oz (100.8 kg)  ?Height: 5' 3.5" (1.613 m)  ? ?Body mass index is 38.74 kg/m?. ? ? ?  04/10/2021  ? 10:12 AM 01/28/2021  ?  9:46 AM  ?Advanced Directives  ?Does Patient Have a Medical Advance Directive? No No  ? ? ?Current Medications (verified) ?Outpatient Encounter Medications as of 12/16/2021  ?Medication Sig  ? amLODipine (NORVASC) 5 MG tablet TAKE 1 TABLET(5 MG) BY MOUTH DAILY  ? atorvastatin (LIPITOR) 40 MG tablet TAKE 1 TABLET(40 MG) BY MOUTH EVERY MORNING  ? benztropine (COGENTIN) 1 MG tablet Take 1 mg by mouth every morning.  ? busPIRone (BUSPAR) 10 MG tablet Take 20 mg by mouth 2 (two) times daily.  ? doxepin (SINEQUAN) 50 MG capsule Take 50 mg by mouth at bedtime.  ? ibuprofen (ADVIL) 800 MG tablet Take 1 tablet (800 mg total) by mouth every 8 (eight) hours as needed.  ? VRAYLAR 6 MG CAPS Take 1 capsule by mouth daily.  ? hydrochlorothiazide (HYDRODIURIL) 25 MG tablet Take 1 tablet (25 mg total) by mouth daily. (Patient not taking: Reported on 12/16/2021)  ? [DISCONTINUED] ibuprofen (ADVIL) 800 MG tablet Take 1 tablet (800 mg total) by mouth every 8 (eight) hours as needed.  ? ?No facility-administered encounter medications on file as of 12/16/2021.  ? ? ?Allergies (verified) ?Lisinopril, Losartan, and Other  ? ?History: ?Past Medical History:  ?Diagnosis Date  ? Anemia   ? 2015  ? Anxiety   ? Depression   ? Headache   ? H/O  ? Hyperlipidemia   ? Hypertension   ? Sleep apnea   ? CPAP  ? ?Past Surgical History:  ?Procedure Laterality Date  ? BACK SURGERY    ? LUMBAR  ? CARPAL TUNNEL RELEASE Right 06/19/2015  ? Procedure: CARPAL TUNNEL RELEASE;   Surgeon: Hessie Knows, MD;  Location: [facility] ORS;  Service: Orthopedics;  Laterality: Right;  ? COLONOSCOPY WITH PROPOFOL N/A 01/28/2021  ? Procedure: COLONOSCOPY WITH PROPOFOL;  Surgeon: Lin Landsman, MD;  Location: [facility] ENDOSCOPY;  Service: Gastroenterology;  Laterality: N/A;  ? DILATION AND CURETTAGE OF UTERUS    ? FLEXIBLE SIGMOIDOSCOPY N/A 04/10/2021  ? Procedure: FLEXIBLE SIGMOIDOSCOPY;  Surgeon: Lin Landsman, MD;  Location[facility] ENDOSCOPY;  Service: Gastroenterology;  Laterality: N/A;  ? ?Family History  ?Problem Relation Age of Onset  ? Diabetes Mother   ? ?Social History  ? ?Socioeconomic History  ? Marital status: Single  ?  Spouse name: Not on file  ? Number of children: Not on file  ? Years of education: Not on file  ? Highest education level: Not on file  ?Occupational History  ? Not on file  ?Tobacco Use  ? Smoking status: Former  ?  Packs/day: 0.50  ?  Years: 35.00  ?  Pack years: 17.50  ?  Types: Cigarettes  ?  Start date: 09/12/2021  ? Smokeless tobacco: Never  ?Vaping Use  ? Vaping Use: Never used  ?Substance and Sexual Activity  ? Alcohol use: No  ? Drug use: Yes  ?  Frequency: 7.0 times per week  ?  Types: Marijuana  ? Sexual activity: Not on file  ?Other Topics Concern  ? Not on file  ?Social History Narrative  ? Not on file  ? ?Social Determinants of Health  ? ?Financial Resource Strain: Low Risk   ? Difficulty of Paying Living Expenses: Not hard at all  ?Food Insecurity: No Food Insecurity  ? Worried About Charity fundraiser in the Last Year: Never true  ? Ran Out of Food in the Last Year: Never true  ?Transportation Needs: No Transportation Needs  ? Lack of Transportation (Medical): No  ? Lack of Transportation (Non-Medical): No  ?Physical Activity: Inactive  ? Days of Exercise per Week: 0 days  ? Minutes of Exercise per Session: 0 min  ?Stress: Stress Concern Present  ? Feeling of Stress : Very much  ?Social Connections: Moderately Integrated  ? Frequency of Communication with  Friends and Family: More than three times a week  ? Frequency of Social Gatherings with Friends and Family: Once a week  ? Attends Religious Services: More than 4 times per year  ? Active Member of Clubs or Organizations: Yes  ? Attends Archivist Meetings: More than 4 times per year  ? Marital Status: Never married  ? ? ?Tobacco Counseling ?Counseling given: Not Answered ? ? ?Clinical Intake: ? ?Pre-visit preparation completed: No ? ?Pain : No/denies pain ? ?  ? ?Nutritional Status: BMI > 30  Obese ?Nutritional Risks: None ?Diabetes: No ? ?How often do you need to have someone help you when you read instructions, pamphlets, or other written materials from your doctor or pharmacy?: 1 - Never ? ?Diabetic? No   ? ?Interpreter Needed?: No ? ?Information entered by :: Donnie Mesa, CMA ? ?Pt report a typically meal is meat, vegetable and starch w/ snacks in between.  ? ?Activities of Daily Living ? ?  12/16/2021  ?  1:39 PM  ?In your present state of health, do you have any difficulty performing the following activities:  ?Hearing? 0  ?Vision? 0  ?Difficulty concentrating or making decisions? 1  ?Walking or climbing stairs? 0  ?Dressing or bathing? 0  ?Doing errands, shopping? 0  ? ? ?Patient Care Team: ?Jearld Fenton, NP as PCP - General (Internal Medicine) ?                                      Psychiatrist  ? ?Indicate any recent Medical Services you may have received from other than [facility] providers in the past year (date may be approximate). ?No hospitalization in the past 12 months.  ?   ?Assessment:  ? This is a routine wellness examination for Courtney Carrillo. ? ?Hearing/Vision screen ?Vision Screening  ? Right eye Left eye Both eyes  ?Without correction 20/25 20/25 20/20   ?With correction     ? ? ?Dietary issues and exercise activities discussed: ?Current Exercise Habits: The patient does not participate in regular exercise at present, Exercise limited by: None identified ? ? Goals Addressed   ? ?  ?  ?  ?  ?  This Visit's Progress  ?  Weight (lb) < 200 lb (90.7 kg)     ? ?  ? ?Depression Screen ? ?  12/16/2021  ?  1:39 PM 12/13/2020  ?  8:27 AM  ?PHQ 2/9 Scores  ?PHQ - 2 Score 6 6  ?PHQ-  9 Score 18 15  ?  ?Fall Risk ? ?  12/16/2021  ?  1:40 PM 12/13/2020  ?  8:28 AM  ?Fall Risk   ?Falls in the past year? 0 0  ?Number falls in past yr: 0 0  ?Injury with Fall? 0   ?Risk for fall due to : No Fall Risks No Fall Risks  ?Follow up Falls evaluation completed Falls evaluation completed  ? ? ?FALL RISK PREVENTION PERTAINING TO THE HOME: ? ?Any stairs in or around the home? Yes  ?If so, are there any without handrails? Yes  ?Home free of loose throw rugs in walkways, pet beds, electrical cords, etc? Yes  ?Adequate lighting in your home to reduce risk of falls? Yes  ? ?ASSISTIVE DEVICES UTILIZED TO PREVENT FALLS: ? ?Life alert? No  ?Use of a cane, walker or w/c? No  ?Grab bars in the bathroom? No  ?Shower chair or bench in shower? No  ?Elevated toilet seat or a handicapped toilet? No  ? ?TIMED UP AND GO: ? ?Was the test performed? Yes .  ?Length of time to ambulate 10 feet: 10  sec.  ? ?Gait steady and fast without use of assistive device ? ?Cognitive Function: ?  ?  ? ?  12/16/2021  ?  1:54 PM  ?6CIT Screen  ?What Year? 0 points  ?What month? 0 points  ?What time? 0 points  ?Count back from 20 0 points  ?Months in reverse 0 points  ?Repeat phrase 0 points  ?Total Score 0 points  ? ? ?Immunizations ?Immunization History  ?Administered Date(s) Administered  ? Hepatitis B 06/11/2012  ? Hepatitis B, adult 06/11/2012, 07/25/2013  ? Influenza, Seasonal, Injecte, Preservative Fre 06/11/2012  ? Influenza,inj,Quad PF,6+ Mos 07/28/2014, 08/16/2015, 05/26/2016, 06/18/2017, 06/03/2018, 06/06/2019, 06/03/2021  ? Influenza-Unspecified 05/08/2011, 06/11/2012, 05/09/2013, 06/18/2017, 05/29/2020  ? MMR 05/08/2011, 07/04/2011  ? PFIZER(Purple Top)SARS-COV-2 Vaccination 11/04/2019, 11/25/2019, 07/11/2020  ? PPD Test 07/25/2013  ? Tdap 06/11/2012  ? Varicella  05/08/2011  ? ? ?TDAP status: Up to date ? ?Flu Vaccine status: Up to date ? ?Pneumococcal vaccine status: Up to date ? ?Covid-19 vaccine status: Completed vaccines ? ?Qualifies for Shingles Vaccine? Yes   ?[place]

## 2021-12-16 NOTE — Assessment & Plan Note (Signed)
Encouraged diet and exercise for weight loss ?

## 2021-12-16 NOTE — Assessment & Plan Note (Signed)
Encourage weight loss as this can help reduce sleep apnea symptoms Continue CPAP use 

## 2021-12-16 NOTE — Assessment & Plan Note (Signed)
CMET and lipid profile today Encouraged her to consume a low fat diet Continue Atorvastatin 

## 2021-12-16 NOTE — Assessment & Plan Note (Signed)
Stable on her current dose of Vraylar, Cogentin, Doxepin and Buspar ?She will continue to follow with psychiatry ?

## 2021-12-16 NOTE — Progress Notes (Signed)
? ?Subjective:  ? ? Patient ID: Courtney Carrillo, female    DOB: [birth year], 56 y.o.   MRN: [id number] ? ?HPI ? ?Patient presents to clinic today for follow-up of chronic conditions. ? ?HTN: Her BP today is 128/80.  She is taking Amlodipine and HCTZ as prescribed.  ECG from 06/2015 reviewed. ? ?HLD: Her last LDL was 93, triglycerides 254, 12/2020.  She denies myalgias on Atorvastatin.  She tries to consume a low-fat diet. ? ?Anxiety and Depression: Chronic, managed on Vraylar, Cogentin, Doxepin and BuSpar.  She is not currently seeing a therapist but does follow with psychiatry.  She denies SI/HI. ? ?Chronic Back Pain: Status post fusion in 2002.  There is no imaging of her lumbar spine on file.  She takes Ibuprofen as needed with good relief of symptoms. ? ?OSA: She averages 8 hours of sleep with the use of her CPAP.  There is no sleep study on file. ? ? ?Review of Systems ? ?   ?Past Medical History:  ?Diagnosis Date  ? Anemia   ? 2015  ? Anxiety   ? Depression   ? Headache   ? H/O  ? Hyperlipidemia   ? Hypertension   ? Sleep apnea   ? CPAP  ? ? ?Current Outpatient Medications  ?Medication Sig Dispense Refill  ? amLODipine (NORVASC) 5 MG tablet TAKE 1 TABLET(5 MG) BY MOUTH DAILY 90 tablet 0  ? atorvastatin (LIPITOR) 40 MG tablet TAKE 1 TABLET(40 MG) BY MOUTH EVERY MORNING 90 tablet 1  ? benztropine (COGENTIN) 1 MG tablet Take 1 mg by mouth every morning.    ? busPIRone (BUSPAR) 10 MG tablet Take 10 mg by mouth 3 (three) times daily.    ? doxepin (SINEQUAN) 50 MG capsule Take 50 mg by mouth at bedtime.    ? hydrochlorothiazide (HYDRODIURIL) 25 MG tablet Take 1 tablet (25 mg total) by mouth daily. 90 tablet 1  ? ibuprofen (ADVIL) 800 MG tablet Take 1 tablet (800 mg total) by mouth every 8 (eight) hours as needed. 30 tablet 2  ? VRAYLAR 6 MG CAPS Take 1 capsule by mouth daily.    ? ?No current facility-administered medications for this visit.  ? ? ?Allergies  ?Allergen Reactions  ? Lisinopril Cough  ? Losartan Cough  ?  Other Other (See Comments)  ?  Perfumes : Congestion  ? ? ?Family History  ?Problem Relation Age of Onset  ? Diabetes Mother   ? ? ?Social History  ? ?Socioeconomic History  ? Marital status: Single  ?  Spouse name: Not on file  ? Number of children: Not on file  ? Years of education: Not on file  ? Highest education level: Not on file  ?Occupational History  ? Not on file  ?Tobacco Use  ? Smoking status: Every Day  ?  Packs/day: 0.50  ?  Years: 35.00  ?  Pack years: 17.50  ?  Types: Cigarettes  ? Smokeless tobacco: Never  ?Vaping Use  ? Vaping Use: Never used  ?Substance and Sexual Activity  ? Alcohol use: No  ? Drug use: Yes  ?  Frequency: 7.0 times per week  ?  Types: Marijuana  ? Sexual activity: Not on file  ?Other Topics Concern  ? Not on file  ?Social History Narrative  ? Not on file  ? ?Social Determinants of Health  ? ?Financial Resource Strain: Not on file  ?Food Insecurity: Not on file  ?Transportation Needs: Not on file  ?Physical  Activity: Not on file  ?Stress: Not on file  ?Social Connections: Not on file  ?Intimate Partner Violence: Not on file  ? ? ? ?Constitutional: Denies fever, malaise, fatigue, headache or abrupt weight changes.  ?HEENT: Denies eye pain, eye redness, ear pain, ringing in the ears, wax buildup, runny nose, nasal congestion, bloody nose, or sore throat. ?Respiratory: Denies difficulty breathing, shortness of breath, cough or sputum production.   ?Cardiovascular: Denies chest pain, chest tightness, palpitations or swelling in the hands or feet.  ?Gastrointestinal: Denies abdominal pain, bloating, constipation, diarrhea or blood in the stool.  ?GU: Denies urgency, frequency, pain with urination, burning sensation, blood in urine, odor or discharge. ?Musculoskeletal: Patient reports chronic back pain.  Denies decrease in range of motion, difficulty with gait, muscle pain or joint swelling.  ?Skin: Denies redness, rashes, lesions or ulcercations.  ?Neurological: Denies dizziness,  difficulty with memory, difficulty with speech or problems with balance and coordination.  ?Psych: Patient has a history of anxiety and depression.  Denies SI/HI. ? ?No other specific complaints in a complete review of systems (except as listed in HPI above). ? ?Objective:  ? Physical Exam ? ?BP 128/80 (BP Location: Left Arm, Patient Position: Sitting, Cuff Size: Large)   Pulse 78   Temp (!) 97.3 ?F (36.3 ?C) (Temporal)   Ht 5' 3.5" (1.613 m)   Wt 222 lb (100.7 kg)   SpO2 99%   BMI 38.71 kg/m?  ? ?Wt Readings from Last 3 Encounters:  ?04/10/21 230 lb (104.3 kg)  ?01/28/21 230 lb 7.5 oz (104.5 kg)  ?12/13/20 228 lb 12.8 oz (103.8 kg)  ? ? ?General: Appears her  stated age obese in NAD. ?Skin: Warm, dry and intact.  ?HEENT: Head: normal shape and size; Eyes: sclera white, no icterus, conjunctiva pink, PERRLA and EOMs intact;  ?Cardiovascular: Normal rate and rhythm. S1,S2 noted.  No murmur, rubs or gallops noted. No JVD or BLE edema. No carotid bruits noted. ?Pulmonary/Chest: Normal effort and positive vesicular breath sounds. No respiratory distress. No wheezes, rales or ronchi noted.  ?Musculoskeletal:  No difficulty with gait.  ?Neurological: Alert and oriented.  ?Psychiatric: Mood and affect normal. Behavior is normal. Judgment and thought content normal.  ? ? ? ?BMET ?   ?Component Value Date/Time  ? NA 143 12/13/2020 0853  ? K 3.8 12/13/2020 0853  ? CL 105 12/13/2020 0853  ? CO2 30 12/13/2020 0853  ? GLUCOSE 83 12/13/2020 0853  ? BUN 10 12/13/2020 0853  ? CREATININE 0.76 12/13/2020 0853  ? CALCIUM 9.7 12/13/2020 0853  ? GFRNONAA 74 08/17/2020 0814  ? GFRAA 86 08/17/2020 0814  ? ? ?Lipid Panel  ?   ?Component Value Date/Time  ? CHOL 165 12/13/2020 0853  ? TRIG 105 12/13/2020 0853  ? HDL 52 12/13/2020 0853  ? CHOLHDL 3.2 12/13/2020 0853  ? LDLCALC 93 12/13/2020 0853  ? ? ?CBC ?   ?Component Value Date/Time  ? WBC 8.1 12/13/2020 0853  ? RBC 5.29 (H) 12/13/2020 0853  ? HGB 12.3 12/13/2020 0853  ? HCT 39.8  12/13/2020 0853  ? PLT 329 12/13/2020 0853  ? MCV 75.2 (L) 12/13/2020 0853  ? MCH 23.3 (L) 12/13/2020 0853  ? MCHC 30.9 (L) 12/13/2020 0853  ? RDW 16.2 (H) 12/13/2020 0853  ? LYMPHSABS 3,106 08/17/2020 0814  ? EOSABS 251 08/17/2020 0814  ? BASOSABS 47 08/17/2020 0814  ? ? ?Hgb A1C ?Lab Results  ?Component Value Date  ? HGBA1C 5.5 08/17/2020  ? ? ? ? ? ? ? ?   ?  Assessment & Plan:  ? ? ? ?Nicki Reaper, NP ? ?

## 2021-12-16 NOTE — Assessment & Plan Note (Signed)
Controlled on Amlodipine and HCTZ ?C-Met today ?Reinforced DASH diet and exercise for weight loss ?

## 2021-12-17 LAB — COMPLETE METABOLIC PANEL WITH GFR
AG Ratio: 1.4 (calc) (ref 1.0–2.5)
ALT: 8 U/L (ref 6–29)
AST: 10 U/L (ref 10–35)
Albumin: 4.2 g/dL (ref 3.6–5.1)
Alkaline phosphatase (APISO): 78 U/L (ref 37–153)
BUN: 7 mg/dL (ref 7–25)
CO2: 30 mmol/L (ref 20–32)
Calcium: 9.8 mg/dL (ref 8.6–10.4)
Chloride: 106 mmol/L (ref 98–110)
Creat: 0.84 mg/dL (ref 0.50–1.03)
Globulin: 3 g/dL (calc) (ref 1.9–3.7)
Glucose, Bld: 105 mg/dL (ref 65–139)
Potassium: 4.1 mmol/L (ref 3.5–5.3)
Sodium: 144 mmol/L (ref 135–146)
Total Bilirubin: 0.4 mg/dL (ref 0.2–1.2)
Total Protein: 7.2 g/dL (ref 6.1–8.1)
eGFR: 82 mL/min/{1.73_m2} (ref >=60)

## 2021-12-17 LAB — CBC
HCT: 37.9 % (ref 35.0–45.0)
Hemoglobin: 11.8 g/dL (ref 11.7–15.5)
MCH: 23 pg — ABNORMAL LOW (ref 27.0–33.0)
MCHC: 31.1 g/dL — ABNORMAL LOW (ref 32.0–36.0)
MCV: 73.7 fL — ABNORMAL LOW (ref 80.0–100.0)
MPV: 11.3 fL (ref 7.5–12.5)
Platelets: 322 10*3/uL (ref 140–400)
RBC: 5.14 10*6/uL — ABNORMAL HIGH (ref 3.80–5.10)
RDW: 16.1 % — ABNORMAL HIGH (ref 11.0–15.0)
WBC: 8.8 10*3/uL (ref 3.8–10.8)

## 2021-12-17 LAB — LIPID PANEL
Cholesterol: 148 mg/dL (ref <200)
HDL: 44 mg/dL — ABNORMAL LOW (ref >=50)
LDL Cholesterol (Calc): 81 mg/dL (calc)
Non-HDL Cholesterol (Calc): 104 mg/dL (calc) (ref <130)
Total CHOL/HDL Ratio: 3.4 (calc) (ref <5.0)
Triglycerides: 134 mg/dL (ref <150)

## 2021-12-17 LAB — HEMOGLOBIN A1C
Hgb A1c MFr Bld: 5.8 % of total Hgb — ABNORMAL HIGH (ref <5.7)
Mean Plasma Glucose: 120 mg/dL
eAG (mmol/L): 6.6 mmol/L

## 2021-12-19 ENCOUNTER — Other Ambulatory Visit: Payer: Self-pay | Admitting: *Deleted

## 2021-12-19 ENCOUNTER — Ambulatory Visit
Admission: RE | Admit: 2021-12-19 | Discharge: 2021-12-19 | Disposition: A | Discharge status: Home / Self Care | Payer: Medicare Other | Source: Ambulatory Visit | Attending: Internal Medicine | Admitting: Internal Medicine

## 2021-12-19 ENCOUNTER — Ambulatory Visit
Admission: RE | Admit: 2021-12-19 | Discharge: 2021-12-19 | Disposition: A | Discharge status: Home / Self Care | Payer: Self-pay | Source: Ambulatory Visit | Attending: *Deleted | Admitting: *Deleted

## 2021-12-19 DIAGNOSIS — Z1231 Encounter for screening mammogram for malignant neoplasm of breast: Secondary | ICD-10-CM

## 2022-05-04 ENCOUNTER — Other Ambulatory Visit: Payer: Self-pay | Admitting: Internal Medicine

## 2022-05-04 DIAGNOSIS — E785 Hyperlipidemia, unspecified: Secondary | ICD-10-CM

## 2022-05-05 NOTE — Telephone Encounter (Signed)
Requested Prescriptions  Pending Prescriptions Disp Refills  . atorvastatin (LIPITOR) 40 MG tablet [Pharmacy Med Name: ATORVASTATIN 40MG  TABLETS] 90 tablet 2    Sig: TAKE 1 TABLET(40 MG) BY MOUTH EVERY MORNING     Cardiovascular:  Antilipid - Statins Failed - 05/04/2022 10:24 AM      Failed - Lipid Panel in normal range within the last 12 months    Cholesterol  Date Value Ref Range Status  12/16/2021 148 <200 mg/dL Final   LDL Cholesterol (Calc)  Date Value Ref Range Status  12/16/2021 81 mg/dL (calc) Final    Comment:    Reference range: <100 . Desirable range <100 mg/dL for primary prevention;   <70 mg/dL for patients with CHD or diabetic patients  with > or = 2 CHD risk factors. Marland Kitchen LDL-C is now calculated using the Martin-Hopkins  calculation, which is a validated novel method providing  better accuracy than the Friedewald equation in the  estimation of LDL-C.  Cresenciano Genre et al. Annamaria Helling. 1610;960(45): 2061-2068  (http://education.[employer].com/faq/FAQ164)    HDL  Date Value Ref Range Status  12/16/2021 44 (L) > OR = 50 mg/dL Final   Triglycerides  Date Value Ref Range Status  12/16/2021 134 <150 mg/dL Final         Passed - Patient is not pregnant      Passed - Valid encounter within last 12 months    Recent Outpatient Visits          4 months ago Mixed hyperlipidemia   [facility] [place], Coralie Keens, NP   1 year ago Medicare annual wellness visit, subsequent   [facility] [place], Coralie Keens, NP   1 year ago Encounter to establish care with new doctor   [facility], Lupita Raider, [place]

## 2022-06-06 ENCOUNTER — Ambulatory Visit (INDEPENDENT_AMBULATORY_CARE_PROVIDER_SITE_OTHER): Payer: Medicare Other

## 2022-06-06 DIAGNOSIS — Z23 Encounter for immunization: Secondary | ICD-10-CM

## 2022-07-02 ENCOUNTER — Other Ambulatory Visit: Payer: Self-pay | Admitting: Internal Medicine

## 2022-07-02 DIAGNOSIS — I10 Essential (primary) hypertension: Secondary | ICD-10-CM

## 2022-07-02 NOTE — Telephone Encounter (Signed)
Unable to refill per protocol, last refill by  provider 07/02/22 for 30 days. Will refuse duplicate request.   Requested Prescriptions  Pending Prescriptions Disp Refills   amLODipine (NORVASC) 5 MG tablet [Pharmacy Med Name: AMLODIPINE BESYLATE 5MG  TABLETS] 90 tablet     Sig: TAKE 1 TABLET(5 MG) BY MOUTH DAILY     Cardiovascular: Calcium Channel Blockers 2 Failed - 07/02/2022  1:08 PM      Failed - Valid encounter within last 6 months    Recent Outpatient Visits           6 months ago Mixed hyperlipidemia   [facility] [place], [place], NP   1 year ago Medicare annual wellness visit, subsequent   [facility] [place], Mullins, NP   1 year ago Encounter to establish care with new doctor   [facility], [facility], FNP       Future Appointments             In 1 week Jodelle Gross, Sampson Si, NP [facility], [facility]            Passed - Last BP in normal range    BP Readings from Last 1 Encounters:  12/16/21 128/80         Passed - Last Heart Rate in normal range    Pulse Readings from Last 1 Encounters:  12/16/21 78

## 2022-07-02 NOTE — Telephone Encounter (Signed)
Called pt to schedule her 6 month check up with Nicki Reaper, NP.   She is at work and needs to look at her work schedule and call us back.   I let her know that was fine.   "I'll call back after 2:30 when I get off".   I gave her a 30 day courtesy supply of the amlodipine 5 mg tablets.

## 2022-07-11 ENCOUNTER — Encounter: Payer: Self-pay | Admitting: Internal Medicine

## 2022-07-11 ENCOUNTER — Ambulatory Visit (INDEPENDENT_AMBULATORY_CARE_PROVIDER_SITE_OTHER): Payer: Medicare Other | Admitting: Internal Medicine

## 2022-07-11 VITALS — BP 138/80 | HR 78 | Temp 96.4°F | Ht 64.5 in | Wt 219.0 lb

## 2022-07-11 DIAGNOSIS — R7303 Prediabetes: Secondary | ICD-10-CM | POA: Diagnosis not present

## 2022-07-11 DIAGNOSIS — Z0001 Encounter for general adult medical examination with abnormal findings: Secondary | ICD-10-CM | POA: Diagnosis not present

## 2022-07-11 DIAGNOSIS — Z6837 Body mass index (BMI) 37.0-37.9, adult: Secondary | ICD-10-CM | POA: Diagnosis not present

## 2022-07-11 DIAGNOSIS — E785 Hyperlipidemia, unspecified: Secondary | ICD-10-CM | POA: Diagnosis not present

## 2022-07-11 NOTE — Patient Instructions (Signed)

## 2022-07-11 NOTE — Progress Notes (Signed)
Subjective:    Patient ID: Courtney Carrillo, female    DOB: 12-28-1965, 56 y.o.   MRN: [id number]  HPI  Patient presents to clinic today for her annual exam.  Flu: 05/2022 Tetanus: 06/2012 COVID: Pfizer x3 Shingrix: Never Pap smear: 12/2020 Mammogram: 12/2021 Colon screening: Flex sig, 03/2019 Vision screening: annually Dentist: as needed, dentures  Diet: She does eat meat. She consumes fruits and veggies. She does eat some fried foods. She drinks mostly soda, tea and water. Exercise: Walking  Review of Systems     Past Medical History:  Diagnosis Date   Anemia    2015   Anxiety    Depression    Headache    H/O   Hyperlipidemia    Hypertension    Sleep apnea    CPAP    Current Outpatient Medications  Medication Sig Dispense Refill   amLODipine (NORVASC) 5 MG tablet TAKE 1 TABLET(5 MG) BY MOUTH DAILY 30 tablet 0   atorvastatin (LIPITOR) 40 MG tablet TAKE 1 TABLET(40 MG) BY MOUTH EVERY MORNING 90 tablet 2   benztropine (COGENTIN) 1 MG tablet Take 1 mg by mouth every morning.     busPIRone (BUSPAR) 10 MG tablet Take 20 mg by mouth 2 (two) times daily.     doxepin (SINEQUAN) 50 MG capsule Take 50 mg by mouth at bedtime.     hydrochlorothiazide (HYDRODIURIL) 25 MG tablet Take 1 tablet (25 mg total) by mouth daily. (Patient not taking: Reported on 12/16/2021) 90 tablet 1   ibuprofen (ADVIL) 800 MG tablet Take 1 tablet (800 mg total) by mouth every 8 (eight) hours as needed. 30 tablet 2   VRAYLAR 6 MG CAPS Take 1 capsule by mouth daily.     No current facility-administered medications for this visit.    Allergies  Allergen Reactions   Lisinopril Cough   Losartan Cough   Other Other (See Comments)    Perfumes : Congestion    Family History  Problem Relation Age of Onset   Diabetes Mother     Social History   Socioeconomic History   Marital status: Single    Spouse name: Not on file   Number of children: Not on file   Years of education: Not on file   Highest  education level: Not on file  Occupational History   Not on file  Tobacco Use   Smoking status: Former    Packs/day: 0.50    Years: 35.00    Total pack years: 17.50    Types: Cigarettes    Start date: 09/12/2021   Smokeless tobacco: Never  Vaping Use   Vaping Use: Never used  Substance and Sexual Activity   Alcohol use: No   Drug use: Yes    Frequency: 7.0 times per week    Types: Marijuana   Sexual activity: Not on file  Other Topics Concern   Not on file  Social History Narrative   Not on file   Social Determinants of Health   Financial Resource Strain: Low Risk  (12/16/2021)   Overall Financial Resource Strain (CARDIA)    Difficulty of Paying Living Expenses: Not hard at all  Food Insecurity: No Food Insecurity (12/16/2021)   Hunger Vital Sign    Worried About Running Out of Food in the Last Year: Never true    Ran Out of Food in the Last Year: Never true  Transportation Needs: No Transportation Needs (12/16/2021)   PRAPARE - Transportation    Lack of Transportation (  Medical): No    Lack of Transportation (Non-Medical): No  Physical Activity: Inactive (12/16/2021)   Exercise Vital Sign    Days of Exercise per Week: 0 days    Minutes of Exercise per Session: 0 min  Stress: Stress Concern Present (12/16/2021)   [employer] of Occupational Health - Occupational Stress Questionnaire    Feeling of Stress : Very much  Social Connections: Moderately Integrated (12/16/2021)   Social Connection and Isolation Panel [NHANES]    Frequency of Communication with Friends and Family: More than three times a week    Frequency of Social Gatherings with Friends and Family: Once a week    Attends Religious Services: More than 4 times per year    Active Member of [employer] or Organizations: Yes    Attends Engineer, structural: More than 4 times per year    Marital Status: Never married  Intimate Partner Violence: Not At Risk (12/16/2021)   Humiliation, Afraid, Rape, and Kick  questionnaire    Fear of Current or Ex-Partner: No    Emotionally Abused: No    Physically Abused: No    Sexually Abused: No     Constitutional: Denies fever, malaise, fatigue, headache or abrupt weight changes.  HEENT: Denies eye pain, eye redness, ear pain, ringing in the ears, wax buildup, runny nose, nasal congestion, bloody nose, or sore throat. Respiratory: Denies difficulty breathing, shortness of breath, cough or sputum production.   Cardiovascular: Denies chest pain, chest tightness, palpitations or swelling in the hands or feet.  Gastrointestinal: Denies abdominal pain, bloating, constipation, diarrhea or blood in the stool.  GU: Denies urgency, frequency, pain with urination, burning sensation, blood in urine, odor or discharge. Musculoskeletal: Patient reports chronic back pain.  Denies decrease in range of motion, difficulty with gait, or joint swelling.  Skin: Denies redness, rashes, lesions or ulcercations.  Neurological: Denies dizziness, difficulty with memory, difficulty with speech or problems with balance and coordination.  Psych: Patient has a history of anxiety and depression.  Denies SI/HI.  No other specific complaints in a complete review of systems (except as listed in HPI above).  Objective:   Physical Exam BP 138/80 (BP Location: Left Arm, Patient Position: Sitting, Cuff Size: Normal)   Pulse 78   Temp (!) 96.4 F (35.8 C) (Temporal)   Ht 5' 4.5" (1.638 m)   Wt 219 lb (99.3 kg)   SpO2 99%   BMI 37.01 kg/m   Wt Readings from Last 3 Encounters:  12/16/21 222 lb 3.2 oz (100.8 kg)  12/16/21 222 lb (100.7 kg)  04/10/21 230 lb (104.3 kg)    General: Appears her stated age, obese, in NAD. Skin: Warm, dry and intact.  HEENT: Head: normal shape and size; Eyes: sclera white, no icterus, conjunctiva pink, PERRLA and EOMs intact;  Neck:  Neck supple, trachea midline. No masses, lumps or thyromegaly present.  Cardiovascular: Normal rate and rhythm. S1,S2  noted.  No murmur, rubs or gallops noted. No JVD or BLE edema. No carotid bruits noted. Pulmonary/Chest: Normal effort and positive vesicular breath sounds. No respiratory distress. No wheezes, rales or ronchi noted.  Abdomen:  Normal bowel sounds.  Musculoskeletal: Strength 5/5 BUE/BLE.  No difficulty with gait.  Neurological: Alert and oriented. Cranial nerves II-XII grossly intact. Coordination normal.  Psychiatric: Mood and affect normal. Behavior is normal. Judgment and thought content normal.     BMET    Component Value Date/Time   NA 144 12/16/2021 1405   K 4.1 12/16/2021 1405  CL 106 12/16/2021 1405   CO2 30 12/16/2021 1405   GLUCOSE 105 12/16/2021 1405   BUN 7 12/16/2021 1405   CREATININE 0.84 12/16/2021 1405   CALCIUM 9.8 12/16/2021 1405   GFRNONAA 74 08/17/2020 0814   GFRAA 86 08/17/2020 0814    Lipid Panel     Component Value Date/Time   CHOL 148 12/16/2021 1405   TRIG 134 12/16/2021 1405   HDL 44 (L) 12/16/2021 1405   CHOLHDL 3.4 12/16/2021 1405   LDLCALC 81 12/16/2021 1405    CBC    Component Value Date/Time   WBC 8.8 12/16/2021 1405   RBC 5.14 (H) 12/16/2021 1405   HGB 11.8 12/16/2021 1405   HCT 37.9 12/16/2021 1405   PLT 322 12/16/2021 1405   MCV 73.7 (L) 12/16/2021 1405   MCH 23.0 (L) 12/16/2021 1405   MCHC 31.1 (L) 12/16/2021 1405   RDW 16.1 (H) 12/16/2021 1405   LYMPHSABS 3,106 08/17/2020 0814   EOSABS 251 08/17/2020 0814   BASOSABS 47 08/17/2020 0814    Hgb A1C Lab Results  Component Value Date   HGBA1C 5.8 (H) 12/16/2021            Assessment & Plan:   Preventative Health Maintenance:  Flu shot UTD She declines tetanus for financial reasons, advised if she gets bit or cut to go get this done Encouraged her to get her COVID booster Discussed Shingrix vaccine, she will check coverage with her insurance company and schedule a visit at the pharmacy if she would like to have this done Pap smear UTD Mammogram UTD Colon screening  UTD Encouraged her to consume a balanced diet and exercise regimen Advised her to see an eye doctor and dentist annually We will check CBC, c-Met, lipid and A1c today  RTC in 6 months, follow-up chronic conditions Nicki Reaper, NP

## 2022-07-11 NOTE — Assessment & Plan Note (Signed)
Encourage diet and exercise for weight loss 

## 2022-07-12 LAB — HEMOGLOBIN A1C
Hgb A1c MFr Bld: 5.9 % of total Hgb — ABNORMAL HIGH (ref <5.7)
Mean Plasma Glucose: 123 mg/dL
eAG (mmol/L): 6.8 mmol/L

## 2022-07-12 LAB — CBC
HCT: 37.2 % (ref 35.0–45.0)
Hemoglobin: 11.7 g/dL (ref 11.7–15.5)
MCH: 22.6 pg — ABNORMAL LOW (ref 27.0–33.0)
MCHC: 31.5 g/dL — ABNORMAL LOW (ref 32.0–36.0)
MCV: 71.8 fL — ABNORMAL LOW (ref 80.0–100.0)
MPV: 11.6 fL (ref 7.5–12.5)
Platelets: 330 10*3/uL (ref 140–400)
RBC: 5.18 10*6/uL — ABNORMAL HIGH (ref 3.80–5.10)
RDW: 15.4 % — ABNORMAL HIGH (ref 11.0–15.0)
WBC: 8.4 10*3/uL (ref 3.8–10.8)

## 2022-07-12 LAB — COMPLETE METABOLIC PANEL WITH GFR
AG Ratio: 1.5 (calc) (ref 1.0–2.5)
ALT: 10 U/L (ref 6–29)
AST: 13 U/L (ref 10–35)
Albumin: 4.3 g/dL (ref 3.6–5.1)
Alkaline phosphatase (APISO): 72 U/L (ref 37–153)
BUN: 10 mg/dL (ref 7–25)
CO2: 29 mmol/L (ref 20–32)
Calcium: 9.8 mg/dL (ref 8.6–10.4)
Chloride: 106 mmol/L (ref 98–110)
Creat: 0.86 mg/dL (ref 0.50–1.03)
Globulin: 2.9 g/dL (calc) (ref 1.9–3.7)
Glucose, Bld: 107 mg/dL — ABNORMAL HIGH (ref 65–99)
Potassium: 3.9 mmol/L (ref 3.5–5.3)
Sodium: 143 mmol/L (ref 135–146)
Total Bilirubin: 0.3 mg/dL (ref 0.2–1.2)
Total Protein: 7.2 g/dL (ref 6.1–8.1)
eGFR: 79 mL/min/{1.73_m2} (ref >=60)

## 2022-07-12 LAB — LIPID PANEL
Cholesterol: 145 mg/dL (ref <200)
HDL: 49 mg/dL — ABNORMAL LOW (ref >=50)
LDL Cholesterol (Calc): 77 mg/dL (calc)
Non-HDL Cholesterol (Calc): 96 mg/dL (calc) (ref <130)
Total CHOL/HDL Ratio: 3 (calc) (ref <5.0)
Triglycerides: 106 mg/dL (ref <150)

## 2022-11-03 ENCOUNTER — Other Ambulatory Visit: Payer: Self-pay | Admitting: Internal Medicine

## 2022-11-03 DIAGNOSIS — I10 Essential (primary) hypertension: Secondary | ICD-10-CM

## 2022-11-04 NOTE — Telephone Encounter (Signed)
Patient had CPE 12/23.  Requested Prescriptions  Pending Prescriptions Disp Refills   amLODipine (NORVASC) 5 MG tablet [Pharmacy Med Name: AMLODIPINE BESYLATE 5MG  TABLETS] 90 tablet 0    Sig: TAKE 1 TABLET(5 MG) BY MOUTH DAILY     Cardiovascular: Calcium Channel Blockers 2 Passed - 11/03/2022  9:57 AM      Passed - Last BP in normal range    BP Readings from Last 1 Encounters:  07/11/22 138/80         Passed - Last Heart Rate in normal range    Pulse Readings from Last 1 Encounters:  07/11/22 78         Passed - Valid encounter within last 6 months    Recent Outpatient Visits           3 months ago Encounter for general adult medical examination with abnormal findings   [facility] [place], Coralie Keens, NP   10 months ago Mixed hyperlipidemia   [facility] [place], Coralie Keens, NP   1 year ago Medicare annual wellness visit, subsequent   [facility] [place], Coralie Keens, NP   2 years ago Encounter to establish care with new doctor   [facility] [place], Lupita Raider, [place]

## 2022-12-01 ENCOUNTER — Telehealth: Payer: Self-pay | Admitting: Internal Medicine

## 2022-12-01 NOTE — Telephone Encounter (Signed)
Contacted Courtney Carrillo to schedule their annual wellness visit. Appointment made for 12/25/2022.  Verlee Rossetti; Care Guide Ambulatory Clinical Support [place] [facility] Direct Dial: [phone]

## 2022-12-01 NOTE — Telephone Encounter (Signed)
Contacted Courtney Carrillo to schedule their annual wellness visit. Appointment made for 12/19/2022.  Verlee Rossetti; Care Guide Ambulatory Clinical Support [place] [facility] Direct Dial: [phone]

## 2022-12-25 ENCOUNTER — Ambulatory Visit (INDEPENDENT_AMBULATORY_CARE_PROVIDER_SITE_OTHER): Payer: 59

## 2022-12-25 VITALS — Ht 64.5 in | Wt 219.0 lb

## 2022-12-25 DIAGNOSIS — Z0000 Encounter for general adult medical examination without abnormal findings: Secondary | ICD-10-CM

## 2022-12-25 NOTE — Patient Instructions (Signed)
Courtney Carrillo , Thank you for taking time to come for your Medicare Wellness Visit. I appreciate your ongoing commitment to your health goals. Please review the following plan we discussed and let me know if I can assist you in the future.   These are the goals we discussed:  Goals      DIET - EAT MORE FRUITS AND VEGETABLES     Weight (lb) < 200 lb (90.7 kg)        This is a list of the screening recommended for you and due dates:  Health Maintenance  Topic Date Due   Zoster (Shingles) Vaccine (1 of 2) Never done   COVID-19 Vaccine (4 - 2023-24 season) 04/11/2022   DTaP/Tdap/Td vaccine (2 - Td or Tdap) 06/11/2022   Flu Shot  03/12/2023   Pap Smear  12/14/2023   Mammogram  12/20/2023   Medicare Annual Wellness Visit  12/25/2023   Colon Cancer Screening  01/29/2024   Hepatitis C Screening: USPSTF Recommendation to screen - Ages 18-79 yo.  Completed   HIV Screening  Completed   HPV Vaccine  Aged Out    Advanced directives: no  Conditions/risks identified: none  Next appointment: Follow up in one year for your annual wellness visit. 12/31/23 @ 3:30 pm by phone  Preventive Care 40-64 Years, Female Preventive care refers to lifestyle choices and visits with your health care provider that can promote health and wellness. What does preventive care include? A yearly physical exam. This is also called an annual well check. Dental exams once or twice a year. Routine eye exams. Ask your health care provider how often you should have your eyes checked. Personal lifestyle choices, including: Daily care of your teeth and gums. Regular physical activity. Eating a healthy diet. Avoiding tobacco and drug use. Limiting alcohol use. Practicing safe sex. Taking low-dose aspirin daily starting at age 82. Taking vitamin and mineral supplements as recommended by your health care provider. What happens during an annual well check? The services and screenings done by your health care provider  during your annual well check will depend on your age, overall health, lifestyle risk factors, and family history of disease. Counseling  Your health care provider may ask you questions about your: Alcohol use. Tobacco use. Drug use. Emotional well-being. Home and relationship well-being. Sexual activity. Eating habits. Work and work Astronomer. Method of birth control. Menstrual cycle. Pregnancy history. Screening  You may have the following tests or measurements: Height, weight, and BMI. Blood pressure. Lipid and cholesterol levels. These may be checked every 5 years, or more frequently if you are over 12 years old. Skin check. Lung cancer screening. You may have this screening every year starting at age 18 if you have a 30-pack-year history of smoking and currently smoke or have quit within the past 15 years. Fecal occult blood test (FOBT) of the stool. You may have this test every year starting at age 32. Flexible sigmoidoscopy or colonoscopy. You may have a sigmoidoscopy every 5 years or a colonoscopy every 10 years starting at age 18. Hepatitis C blood test. Hepatitis B blood test. Sexually transmitted disease (STD) testing. Diabetes screening. This is done by checking your blood sugar (glucose) after you have not eaten for a while (fasting). You may have this done every 1-3 years. Mammogram. This may be done every 1-2 years. Talk to your health care provider about when you should start having regular mammograms. This may depend on whether you have a family history of  breast cancer. BRCA-related cancer screening. This may be done if you have a family history of breast, ovarian, tubal, or peritoneal cancers. Pelvic exam and Pap test. This may be done every 3 years starting at age 22. Starting at age 42, this may be done every 5 years if you have a Pap test in combination with an HPV test. Bone density scan. This is done to screen for osteoporosis. You may have this scan if you are  at high risk for osteoporosis. Discuss your test results, treatment options, and if necessary, the need for more tests with your health care provider. Vaccines  Your health care provider may recommend certain vaccines, such as: Influenza vaccine. This is recommended every year. Tetanus, diphtheria, and acellular pertussis (Tdap, Td) vaccine. You may need a Td booster every 10 years. Zoster vaccine. You may need this after age 43. Pneumococcal 13-valent conjugate (PCV13) vaccine. You may need this if you have certain conditions and were not previously vaccinated. Pneumococcal polysaccharide (PPSV23) vaccine. You may need one or two doses if you smoke cigarettes or if you have certain conditions. Talk to your health care provider about which screenings and vaccines you need and how often you need them. This information is not intended to replace advice given to you by your health care provider. Make sure you discuss any questions you have with your health care provider. Document Released: 08/24/2015 Document Revised: 04/16/2016 Document Reviewed: 05/29/2015 Elsevier Interactive Patient Education  2017 [employer].    Fall Prevention in the Home Falls can cause injuries. They can happen to people of all ages. There are many things you can do to make your home safe and to help prevent falls. What can I do on the outside of my home? Regularly fix the edges of walkways and driveways and fix any cracks. Remove anything that might make you trip as you walk through a door, such as a raised step or threshold. Trim any bushes or trees on the path to your home. Use bright outdoor lighting. Clear any walking paths of anything that might make someone trip, such as rocks or tools. Regularly check to see if handrails are loose or broken. Make sure that both sides of any steps have handrails. Any raised decks and porches should have guardrails on the edges. Have any leaves, snow, or ice cleared  regularly. Use sand or salt on walking paths during winter. Clean up any spills in your garage right away. This includes oil or grease spills. What can I do in the bathroom? Use night lights. Install grab bars by the toilet and in the tub and shower. Do not use towel bars as grab bars. Use non-skid mats or decals in the tub or shower. If you need to sit down in the shower, use a plastic, non-slip stool. Keep the floor dry. Clean up any water that spills on the floor as soon as it happens. Remove soap buildup in the tub or shower regularly. Attach bath mats securely with double-sided non-slip rug tape. Do not have throw rugs and other things on the floor that can make you trip. What can I do in the bedroom? Use night lights. Make sure that you have a light by your bed that is easy to reach. Do not use any sheets or blankets that are too big for your bed. They should not hang down onto the floor. Have a firm chair that has side arms. You can use this for support while you get dressed. Do  not have throw rugs and other things on the floor that can make you trip. What can I do in the kitchen? Clean up any spills right away. Avoid walking on wet floors. Keep items that you use a lot in easy-to-reach places. If you need to reach something above you, use a strong step stool that has a grab bar. Keep electrical cords out of the way. Do not use floor polish or wax that makes floors slippery. If you must use wax, use non-skid floor wax. Do not have throw rugs and other things on the floor that can make you trip. What can I do with my stairs? Do not leave any items on the stairs. Make sure that there are handrails on both sides of the stairs and use them. Fix handrails that are broken or loose. Make sure that handrails are as long as the stairways. Check any carpeting to make sure that it is firmly attached to the stairs. Fix any carpet that is loose or worn. Avoid having throw rugs at the top or  bottom of the stairs. If you do have throw rugs, attach them to the floor with carpet tape. Make sure that you have a light switch at the top of the stairs and the bottom of the stairs. If you do not have them, ask someone to add them for you. What else can I do to help prevent falls? Wear shoes that: Do not have high heels. Have rubber bottoms. Are comfortable and fit you well. Are closed at the toe. Do not wear sandals. If you use a stepladder: Make sure that it is fully opened. Do not climb a closed stepladder. Make sure that both sides of the stepladder are locked into place. Ask someone to hold it for you, if possible. Clearly mark and make sure that you can see: Any grab bars or handrails. First and last steps. Where the edge of each step is. Use tools that help you move around (mobility aids) if they are needed. These include: Canes. Walkers. Scooters. Crutches. Turn on the lights when you go into a dark area. Replace any light bulbs as soon as they burn out. Set up your furniture so you have a clear path. Avoid moving your furniture around. If any of your floors are uneven, fix them. If there are any pets around you, be aware of where they are. Review your medicines with your doctor. Some medicines can make you feel dizzy. This can increase your chance of falling. Ask your doctor what other things that you can do to help prevent falls. This information is not intended to replace advice given to you by your health care provider. Make sure you discuss any questions you have with your health care provider. Document Released: 05/24/2009 Document Revised: 01/03/2016 Document Reviewed: 09/01/2014 Elsevier Interactive Patient Education  2017 [employer].

## 2022-12-25 NOTE — Progress Notes (Signed)
I connected with  Courtney Carrillo on 12/25/22 by a audio enabled telemedicine application and verified that I am speaking with the correct person using two identifiers.  Patient Location: Home  Provider Location: Office/Clinic  I discussed the limitations of evaluation and management by telemedicine. The patient expressed understanding and agreed to proceed.  Subjective:   Courtney Carrillo is a 57 y.o. female who presents for Medicare Annual (Subsequent) preventive examination.  Review of Systems     Cardiac Risk Factors include: hypertension     Objective:    There were no vitals filed for this visit. There is no height or weight on file to calculate BMI.     12/25/2022    3:33 PM 04/10/2021   10:12 AM 01/28/2021    9:46 AM  Advanced Directives  Does Patient Have a Medical Advance Directive? No No No  Would patient like information on creating a medical advance directive? No - Patient declined      Current Medications (verified) Outpatient Encounter Medications as of 12/25/2022  Medication Sig   amLODipine (NORVASC) 5 MG tablet TAKE 1 TABLET(5 MG) BY MOUTH DAILY   atorvastatin (LIPITOR) 40 MG tablet TAKE 1 TABLET(40 MG) BY MOUTH EVERY MORNING   benztropine (COGENTIN) 1 MG tablet Take 1 mg by mouth every morning.   busPIRone (BUSPAR) 10 MG tablet Take 20 mg by mouth 2 (two) times daily.   doxepin (SINEQUAN) 50 MG capsule Take 50 mg by mouth at bedtime.   hydrochlorothiazide (HYDRODIURIL) 25 MG tablet Take 1 tablet (25 mg total) by mouth daily.   ibuprofen (ADVIL) 800 MG tablet Take 1 tablet (800 mg total) by mouth every 8 (eight) hours as needed.   VRAYLAR 6 MG CAPS Take 1 capsule by mouth daily.   No facility-administered encounter medications on file as of 12/25/2022.    Allergies (verified) Lisinopril, Losartan, and Other   History: Past Medical History:  Diagnosis Date   Anemia    2015   Anxiety    Depression    Headache    H/O   Hyperlipidemia    Hypertension     Sleep apnea    CPAP   Past Surgical History:  Procedure Laterality Date   BACK SURGERY     LUMBAR   CARPAL TUNNEL RELEASE Right 06/19/2015   Procedure: CARPAL TUNNEL RELEASE;  Surgeon: Kennedy Bucker, MD;  Location: [facility] ORS;  Service: Orthopedics;  Laterality: Right;   COLONOSCOPY WITH PROPOFOL N/A 01/28/2021   Procedure: COLONOSCOPY WITH PROPOFOL;  Surgeon: Toney Reil, MD;  Location: [facility] ENDOSCOPY;  Service: Gastroenterology;  Laterality: N/A;   DILATION AND CURETTAGE OF UTERUS     FLEXIBLE SIGMOIDOSCOPY N/A 04/10/2021   Procedure: FLEXIBLE SIGMOIDOSCOPY;  Surgeon: Toney Reil, MD;  Location: [facility] ENDOSCOPY;  Service: Gastroenterology;  Laterality: N/A;   Family History  Problem Relation Age of Onset   Diabetes Mother    Breast cancer Sister    Social History   Socioeconomic History   Marital status: Single    Spouse name: Not on file   Number of children: Not on file   Years of education: Not on file   Highest education level: Not on file  Occupational History   Not on file  Tobacco Use   Smoking status: Former    Packs/day: 0.50    Years: 35.00    Additional pack years: 0.00    Total pack years: 17.50    Types: Cigarettes    Start date: 09/12/2021  Smokeless tobacco: Never  Vaping Use   Vaping Use: Never used  Substance and Sexual Activity   Alcohol use: No   Drug use: Yes    Frequency: 7.0 times per week    Types: Marijuana   Sexual activity: Not on file  Other Topics Concern   Not on file  Social History Narrative   Not on file   Social Determinants of Health   Financial Resource Strain: Low Risk  (12/25/2022)   Overall Financial Resource Strain (CARDIA)    Difficulty of Paying Living Expenses: Not hard at all  Food Insecurity: No Food Insecurity (12/25/2022)   Hunger Vital Sign    Worried About Running Out of Food in the Last Year: Never true    Ran Out of Food in the Last Year: Never true  Transportation Needs: No Transportation Needs  (12/25/2022)   PRAPARE - Administrator, Civil Service (Medical): No    Lack of Transportation (Non-Medical): No  Physical Activity: Inactive (12/25/2022)   Exercise Vital Sign    Days of Exercise per Week: 0 days    Minutes of Exercise per Session: 0 min  Stress: No Stress Concern Present (12/25/2022)   [employer] of Occupational Health - Occupational Stress Questionnaire    Feeling of Stress : Not at all  Social Connections: Unknown (12/25/2022)   Social Connection and Isolation Panel [NHANES]    Frequency of Communication with Friends and Family: Once a week    Frequency of Social Gatherings with Friends and Family: Not on file    Attends Religious Services: More than 4 times per year    Active Member of [employer] or Organizations: No    Attends Engineer, structural: Never    Marital Status: Never married    Tobacco Counseling Counseling given: Not Answered   Clinical Intake:  Pre-visit preparation completed: Yes  Pain : No/denies pain     Nutritional Risks: None Diabetes: No  How often do you need to have someone help you when you read instructions, pamphlets, or other written materials from your doctor or pharmacy?: 1 - Never  Diabetic?no  Interpreter Needed?: No  Information entered by :: Kennedy Bucker, LPN   Activities of Daily Living    12/25/2022    3:34 PM 07/11/2022    1:29 PM  In your present state of health, do you have any difficulty performing the following activities:  Hearing? 0 0  Vision? 0 0  Difficulty concentrating or making decisions? 0 1  Walking or climbing stairs? 0 0  Dressing or bathing? 0 0  Doing errands, shopping? 0 1  Preparing Food and eating ? N   Using the Toilet? N   In the past six months, have you accidently leaked urine? N   Do you have problems with loss of bowel control? N   Managing your Medications? N   Managing your Finances? N   Housekeeping or managing your Housekeeping? N     Patient Care  Team: Lorre Munroe, NP as PCP - General (Internal Medicine)  Indicate any recent Medical Services you may have received from other than [facility] providers in the past year (date may be approximate).     Assessment:   This is a routine wellness examination for Courtney Carrillo.  Hearing/Vision screen Hearing Screening - Comments:: No aids Vision Screening - Comments:: Wears glasses-Dr.Woodard  Dietary issues and exercise activities discussed: Current Exercise Habits: The patient does not participate in regular exercise at present  Goals Addressed             This Visit's Progress    DIET - EAT MORE FRUITS AND VEGETABLES         Depression Screen    12/25/2022    3:32 PM 07/11/2022    1:28 PM 12/16/2021    1:39 PM 12/13/2020    8:27 AM  PHQ 2/9 Scores  PHQ - 2 Score 0 3 6 6   PHQ- 9 Score 0 15 18 15     Fall Risk    12/25/2022    3:34 PM 07/11/2022    1:29 PM 12/16/2021    1:40 PM 12/13/2020    8:28 AM  Fall Risk   Falls in the past year? 0 0 0 0  Number falls in past yr: 0  0 0  Injury with Fall? 0 0 0   Risk for fall due to : No Fall Risks  No Fall Risks No Fall Risks  Follow up Falls prevention discussed;Falls evaluation completed  Falls evaluation completed Falls evaluation completed    FALL RISK PREVENTION PERTAINING TO THE HOME:  Any stairs in or around the home? Yes  If so, are there any without handrails? No  Home free of loose throw rugs in walkways, pet beds, electrical cords, etc? Yes  Adequate lighting in your home to reduce risk of falls? Yes   ASSISTIVE DEVICES UTILIZED TO PREVENT FALLS:  Life alert? No  Use of a cane, walker or w/c? Yes - cane occasionally Grab bars in the bathroom? No  Shower chair or bench in shower? No  Elevated toilet seat or a handicapped toilet? No    Cognitive Function:        12/25/2022    3:38 PM 12/16/2021    1:54 PM  6CIT Screen  What Year? 0 points 0 points  What month? 0 points 0 points  What time? 0 points 0 points   Count back from 20 0 points 0 points  Months in reverse 0 points 0 points  Repeat phrase 0 points 0 points  Total Score 0 points 0 points    Immunizations Immunization History  Administered Date(s) Administered   Hepatitis B 06/11/2012   Hepatitis B, ADULT 06/11/2012, 07/25/2013   Influenza, Seasonal, Injecte, Preservative Fre 06/11/2012   Influenza,inj,Quad PF,6+ Mos 07/28/2014, 08/16/2015, 05/26/2016, 06/18/2017, 06/03/2018, 06/06/2019, 06/03/2021, 06/06/2022   Influenza-Unspecified 05/08/2011, 06/11/2012, 05/09/2013, 06/18/2017, 05/29/2020   MMR 05/08/2011, 07/04/2011   PFIZER(Purple Top)SARS-COV-2 Vaccination 11/04/2019, 11/25/2019, 07/11/2020   PPD Test 07/25/2013   Tdap 06/11/2012   Varicella 05/08/2011    TDAP status: Due, Education has been provided regarding the importance of this vaccine. Advised may receive this vaccine at local pharmacy or Health Dept. Aware to provide a copy of the vaccination record if obtained from local pharmacy or Health Dept. Verbalized acceptance and understanding.  Flu Vaccine status: Up to date  Pneumococcal vaccine status: Declined,  Education has been provided regarding the importance of this vaccine but patient still declined. Advised may receive this vaccine at local pharmacy or Health Dept. Aware to provide a copy of the vaccination record if obtained from local pharmacy or Health Dept. Verbalized acceptance and understanding.   Covid-19 vaccine status: Completed vaccines  Qualifies for Shingles Vaccine? Yes   Zostavax completed No   Shingrix Completed?: No.    Education has been provided regarding the importance of this vaccine. Patient has been advised to call insurance company to determine out of pocket expense if they have  not yet received this vaccine. Advised may also receive vaccine at local pharmacy or Health Dept. Verbalized acceptance and understanding.  Screening Tests Health Maintenance  Topic Date Due   Zoster Vaccines-  Shingrix (1 of 2) Never done   COVID-19 Vaccine (4 - 2023-24 season) 04/11/2022   DTaP/Tdap/Td (2 - Td or Tdap) 06/11/2022   INFLUENZA VACCINE  03/12/2023   PAP SMEAR-Modifier  12/14/2023   MAMMOGRAM  12/20/2023   Medicare Annual Wellness (AWV)  12/25/2023   COLONOSCOPY (Pts 45-10yrs Insurance coverage will need to be confirmed)  01/29/2024   Hepatitis C Screening  Completed   HIV Screening  Completed   HPV VACCINES  Aged Out    Health Maintenance  Health Maintenance Due  Topic Date Due   Zoster Vaccines- Shingrix (1 of 2) Never done   COVID-19 Vaccine (4 - 2023-24 season) 04/11/2022   DTaP/Tdap/Td (2 - Td or Tdap) 06/11/2022    Colorectal cancer screening: Type of screening: Colonoscopy. Completed 01/28/21. Repeat every 3 years  Mammogram status: Completed 12/19/21. Repeat every year   Lung Cancer Screening: (Low Dose CT Chest recommended if Age 53-80 years, 30 pack-year currently smoking OR have quit w/in 15years.) does not qualify.    Additional Screening:  Hepatitis C Screening: does qualify; Completed 12/13/20  Vision Screening: Recommended annual ophthalmology exams for early detection of glaucoma and other disorders of the eye. Is the patient up to date with their annual eye exam?  Yes  Who is the provider or what is the name of the office in which the patient attends annual eye exams? Dr.Woodard If pt is not established with a provider, would they like to be referred to a provider to establish care? No .   Dental Screening: Recommended annual dental exams for proper oral hygiene  Community Resource Referral / Chronic Care Management: CRR required this visit?  No   CCM required this visit?  No      Plan:     I have personally reviewed and noted the following in the patient's chart:   Medical and social history Use of alcohol, tobacco or illicit drugs  Current medications and supplements including opioid prescriptions. Patient is not currently taking opioid  prescriptions. Functional ability and status Nutritional status Physical activity Advanced directives List of other physicians Hospitalizations, surgeries, and ER visits in previous 12 months Vitals Screenings to include cognitive, depression, and falls Referrals and appointments  In addition, I have reviewed and discussed with patient certain preventive protocols, quality metrics, and best practice recommendations. A written personalized care plan for preventive services as well as general preventive health recommendations were provided to patient.     Hal Hope, LPN   [id number]   Nurse Notes: none

## 2023-01-28 ENCOUNTER — Ambulatory Visit (INDEPENDENT_AMBULATORY_CARE_PROVIDER_SITE_OTHER): Payer: 59 | Admitting: Internal Medicine

## 2023-01-28 ENCOUNTER — Encounter: Payer: Self-pay | Admitting: Internal Medicine

## 2023-01-28 VITALS — BP 124/78 | HR 64 | Temp 96.6°F | Wt 237.0 lb

## 2023-01-28 DIAGNOSIS — I10 Essential (primary) hypertension: Secondary | ICD-10-CM

## 2023-01-28 DIAGNOSIS — F32A Depression, unspecified: Secondary | ICD-10-CM

## 2023-01-28 DIAGNOSIS — G4733 Obstructive sleep apnea (adult) (pediatric): Secondary | ICD-10-CM | POA: Diagnosis not present

## 2023-01-28 DIAGNOSIS — F40243 Fear of flying: Secondary | ICD-10-CM

## 2023-01-28 DIAGNOSIS — R7303 Prediabetes: Secondary | ICD-10-CM | POA: Diagnosis not present

## 2023-01-28 DIAGNOSIS — E7800 Pure hypercholesterolemia, unspecified: Secondary | ICD-10-CM

## 2023-01-28 DIAGNOSIS — M545 Low back pain, unspecified: Secondary | ICD-10-CM | POA: Diagnosis not present

## 2023-01-28 DIAGNOSIS — Z6841 Body Mass Index (BMI) 40.0 and over, adult: Secondary | ICD-10-CM

## 2023-01-28 DIAGNOSIS — G8929 Other chronic pain: Secondary | ICD-10-CM

## 2023-01-28 DIAGNOSIS — F419 Anxiety disorder, unspecified: Secondary | ICD-10-CM

## 2023-01-28 MED ORDER — HYDROXYZINE HCL 10 MG PO TABS: 10.0000 mg | ORAL_TABLET | Freq: Three times a day (TID) | ORAL | 0 refills | Status: DC | PRN

## 2023-01-28 NOTE — Assessment & Plan Note (Signed)
Encouraged regular stretching and core strengthening Continue ibuprofen as needed

## 2023-01-28 NOTE — Assessment & Plan Note (Signed)
Controlled on amlodipine and hydrochlorothiazide Reinforced DASH diet and exercise for weight loss C-Met today

## 2023-01-28 NOTE — Assessment & Plan Note (Signed)
A1c today Encourage low-carb diet and exercise for weight loss 

## 2023-01-28 NOTE — Progress Notes (Signed)
Subjective:    Patient ID: Courtney Carrillo, female    DOB: Jul 02, 1966, 57 y.o.   MRN: [id number]  HPI  Patient presents to clinic today for 52-month follow-up of chronic conditions.  HTN: Her BP today is 124/78.  She is taking amlodipine and HCTZ as prescribed.  ECG from 06/2015 reviewed.  HLD: Her last LDL was 77, triglycerides 409, 07/2022.  She denies myalgias on atorvastatin.  She tries to consume a low-fat diet.  Anxiety and Depression: Chronic, managed on vraylar, cogentin, doxepin and buspirone.  She is not currently seeing a therapist but follows with psychiatry.  She denies SI/HI.  Chronic Back Pain: Status post fusion in 2002.  She takes ibuprofen as needed with good relief of symptoms.  She no longer follows with orthopedics.  OSA: She averages 7 hours of sleep per night without the use of her CPAP.  There is no sleep study on file.  Prediabetes: Her last A1c was 5.9%, 07/2022.  She is not taking any oral diabetic medication at this time.  She does not check her sugars.  She also reports she is taking a flight to [place] next week.  She is very nervous about flying and would like something to help with anxiety.  Review of Systems  Past Medical History:  Diagnosis Date   Anemia    2015   Anxiety    Depression    Headache    H/O   Hyperlipidemia    Hypertension    Sleep apnea    CPAP    Current Outpatient Medications  Medication Sig Dispense Refill   amLODipine (NORVASC) 5 MG tablet TAKE 1 TABLET(5 MG) BY MOUTH DAILY 90 tablet 0   atorvastatin (LIPITOR) 40 MG tablet TAKE 1 TABLET(40 MG) BY MOUTH EVERY MORNING 90 tablet 2   benztropine (COGENTIN) 1 MG tablet Take 1 mg by mouth every morning.     busPIRone (BUSPAR) 10 MG tablet Take 20 mg by mouth 2 (two) times daily.     doxepin (SINEQUAN) 50 MG capsule Take 50 mg by mouth at bedtime.     hydrochlorothiazide (HYDRODIURIL) 25 MG tablet Take 1 tablet (25 mg total) by mouth daily. 90 tablet 1   ibuprofen (ADVIL)  800 MG tablet Take 1 tablet (800 mg total) by mouth every 8 (eight) hours as needed. 30 tablet 2   VRAYLAR 6 MG CAPS Take 1 capsule by mouth daily.     No current facility-administered medications for this visit.    Allergies  Allergen Reactions   Lisinopril Cough   Losartan Cough   Other Other (See Comments)    Perfumes : Congestion    Family History  Problem Relation Age of Onset   Diabetes Mother    Breast cancer Sister     Social History   Socioeconomic History   Marital status: Single    Spouse name: Not on file   Number of children: Not on file   Years of education: Not on file   Highest education level: Not on file  Occupational History   Not on file  Tobacco Use   Smoking status: Former    Packs/day: 0.50    Years: 35.00    Additional pack years: 0.00    Total pack years: 17.50    Types: Cigarettes    Start date: 09/12/2021   Smokeless tobacco: Never  Vaping Use   Vaping Use: Never used  Substance and Sexual Activity   Alcohol use: No  Drug use: Yes    Frequency: 7.0 times per week    Types: Marijuana   Sexual activity: Not on file  Other Topics Concern   Not on file  Social History Narrative   Not on file   Social Determinants of Health   Financial Resource Strain: Low Risk  (12/25/2022)   Overall Financial Resource Strain (CARDIA)    Difficulty of Paying Living Expenses: Not hard at all  Food Insecurity: No Food Insecurity (12/25/2022)   Hunger Vital Sign    Worried About Running Out of Food in the Last Year: Never true    Ran Out of Food in the Last Year: Never true  Transportation Needs: No Transportation Needs (12/25/2022)   PRAPARE - Administrator, Civil Service (Medical): No    Lack of Transportation (Non-Medical): No  Physical Activity: Inactive (12/25/2022)   Exercise Vital Sign    Days of Exercise per Week: 0 days    Minutes of Exercise per Session: 0 min  Stress: No Stress Concern Present (12/25/2022)   [employer]  of Occupational Health - Occupational Stress Questionnaire    Feeling of Stress : Not at all  Social Connections: Unknown (12/25/2022)   Social Connection and Isolation Panel [NHANES]    Frequency of Communication with Friends and Family: Once a week    Frequency of Social Gatherings with Friends and Family: Not on file    Attends Religious Services: More than 4 times per year    Active Member of [employer] or Organizations: No    Attends Banker Meetings: Never    Marital Status: Never married  Intimate Partner Violence: Not At Risk (12/25/2022)   Humiliation, Afraid, Rape, and Kick questionnaire    Fear of Current or Ex-Partner: No    Emotionally Abused: No    Physically Abused: No    Sexually Abused: No     Constitutional: Denies fever, malaise, fatigue, headache or abrupt weight changes.  HEENT: Denies eye pain, eye redness, ear pain, ringing in the ears, wax buildup, runny nose, nasal congestion, bloody nose, or sore throat. Respiratory: Denies difficulty breathing, shortness of breath, cough or sputum production.   Cardiovascular: Denies chest pain, chest tightness, palpitations or swelling in the hands or feet.  Gastrointestinal: Denies abdominal pain, bloating, constipation, diarrhea or blood in the stool.  GU: Denies urgency, frequency, pain with urination, burning sensation, blood in urine, odor or discharge. Musculoskeletal: Patient reports chronic back pain.  Denies decrease in range of motion, difficulty with gait, or joint swelling.  Skin: Denies redness, rashes, lesions or ulcercations.  Neurological: Denies dizziness, difficulty with memory, difficulty with speech or problems with balance and coordination.  Psych: Patient has a history of anxiety and depression.  Denies SI/HI.  No other specific complaints in a complete review of systems (except as listed in HPI above).     Objective:   Physical Exam  BP 124/78 (BP Location: Right Arm, Patient Position:  Sitting, Cuff Size: Large)   Pulse 64   Temp (!) 96.6 F (35.9 C) (Temporal)   Wt 237 lb (107.5 kg)   SpO2 95%   BMI 40.05 kg/m   Wt Readings from Last 3 Encounters:  12/25/22 219 lb (99.3 kg)  07/11/22 219 lb (99.3 kg)  12/16/21 222 lb 3.2 oz (100.8 kg)    General: Appears her stated age, obese in NAD. Skin: Warm, dry and intact.  HEENT: Head: normal shape and size; Eyes: sclera white, no icterus, conjunctiva pink,  PERRLA and EOMs intact;  Cardiovascular: Normal rate and rhythm. S1,S2 noted.  No murmur, rubs or gallops noted. No JVD or BLE edema. No carotid bruits noted. Pulmonary/Chest: Normal effort and positive vesicular breath sounds. No respiratory distress. No wheezes, rales or ronchi noted.  Musculoskeletal: Pain with palpation over the lumbar spine.  No difficulty with gait.  Neurological: Alert and oriented.  Coordination normal.  Psychiatric: Mood and affect normal. Behavior is normal. Judgment and thought content normal.     BMET    Component Value Date/Time   NA 143 07/11/2022 1328   K 3.9 07/11/2022 1328   CL 106 07/11/2022 1328   CO2 29 07/11/2022 1328   GLUCOSE 107 (H) 07/11/2022 1328   BUN 10 07/11/2022 1328   CREATININE 0.86 07/11/2022 1328   CALCIUM 9.8 07/11/2022 1328   GFRNONAA 74 08/17/2020 0814   GFRAA 86 08/17/2020 0814    Lipid Panel     Component Value Date/Time   CHOL 145 07/11/2022 1328   TRIG 106 07/11/2022 1328   HDL 49 (L) 07/11/2022 1328   CHOLHDL 3.0 07/11/2022 1328   LDLCALC 77 07/11/2022 1328    CBC    Component Value Date/Time   WBC 8.4 07/11/2022 1328   RBC 5.18 (H) 07/11/2022 1328   HGB 11.7 07/11/2022 1328   HCT 37.2 07/11/2022 1328   PLT 330 07/11/2022 1328   MCV 71.8 (L) 07/11/2022 1328   MCH 22.6 (L) 07/11/2022 1328   MCHC 31.5 (L) 07/11/2022 1328   RDW 15.4 (H) 07/11/2022 1328   LYMPHSABS 3,106 08/17/2020 0814   EOSABS 251 08/17/2020 0814   BASOSABS 47 08/17/2020 0814    Hgb A1C Lab Results  Component  Value Date   HGBA1C 5.9 (H) 07/11/2022           Assessment & Plan:   Flight Anxiety:  Rx for hydroxyzine 10 mg 3 times daily as needed-sedation caution given   RTC in 6 months for your annual exam Nicki Reaper, NP

## 2023-01-28 NOTE — Assessment & Plan Note (Signed)
Noncompliant with CPAP Encouraged weight loss as this can produce sleep apnea symptoms 

## 2023-01-28 NOTE — Assessment & Plan Note (Signed)
C-Met and lipid profile today Encouraged to consume a low-fat diet Continue atorvastatin

## 2023-01-28 NOTE — Patient Instructions (Signed)
Gallbladder Eating Plan High blood cholesterol, obesity, a sedentary lifestyle, an unhealthy diet, and diabetes are risk factors for developing gallstones. If you have a gallbladder condition, you may have trouble digesting fats and tolerating high fat intake. Eating a low-fat diet can help reduce your symptoms and may be helpful before and after having surgery to remove your gallbladder (cholecystectomy). Your health care provider may recommend that you work with a dietitian to help you reduce the amount of fat in your diet. What are tips for following this plan? General guidelines Limit your fat intake to less than 30% of your total daily calories. If you eat around 1,800 calories each day, this means eating less than 60 grams (g) of fat per day. Fat is an important part of a healthy diet. Eating a low-fat diet can make it hard to maintain a healthy body weight. Ask your dietitian how much fat, calories, and other nutrients you need each day. Eat small, frequent meals throughout the day instead of three large meals. Drink at least 8-10 cups (1.9-2.4 L) of fluid a day. Drink enough fluid to keep your urine pale yellow. If you drink alcohol: Limit how much you have to: 0-1 drink a day for women who are not pregnant. 0-2 drinks a day for men. Know how much alcohol is in a drink. In the [place] one drink equals one 12 oz bottle of beer (355 mL), one 5 oz glass of wine (148 mL), or one 1 oz glass of hard liquor (44 mL). Reading food labels  Check nutrition facts on food labels for the amount of fat per serving. Choose foods with less than 3 grams of fat per serving. Shopping Choose nonfat and low-fat healthy foods. Look for the words "nonfat," "low-fat," or "fat-free." Avoid buying processed or prepackaged foods. Cooking Cook using low-fat methods, such as baking, broiling, grilling, or boiling. Cook with small amounts of healthy fats, such as olive oil, grapeseed oil, canola oil, avocado oil, or  sunflower oil. What foods are recommended?  All fresh, frozen, or canned fruits and vegetables. Whole grains. Low-fat or nonfat (skim) milk and yogurt. Lean meat, skinless poultry, fish, eggs, and beans. Low-fat protein supplement powders or drinks. Spices and herbs. The items listed above may not be a complete list of foods and beverages you can eat and drink. Contact a dietitian for more information. What foods are not recommended? High-fat foods. These include baked goods, fast food, fatty cuts of meat, ice cream, french toast, sweet rolls, pizza, cheese bread, foods covered with butter, creamy sauces, or cheese. Fried foods. These include french fries, tempura, battered fish, breaded chicken, fried breads, and sweets. Foods that cause bloating and gas. The items listed above may not be a complete list of foods that you should avoid. Contact a dietitian for more information. Summary A low-fat diet can be helpful if you have a gallbladder condition, or before and after gallbladder surgery. Limit your fat intake to less than 30% of your total daily calories. This is about 60 g of fat if you eat 1,800 calories each day. Eat small, frequent meals throughout the day instead of three large meals. This information is not intended to replace advice given to you by your health care provider. Make sure you discuss any questions you have with your health care provider. Document Revised: 07/12/2021 Document Reviewed: 07/12/2021 Elsevier Patient Education  2024 [employer].

## 2023-01-28 NOTE — Assessment & Plan Note (Signed)
Continue Vraylar, Cogentin, doxepin and buspirone per psychiatry Support offered

## 2023-01-28 NOTE — Assessment & Plan Note (Signed)
Encourage diet and exercise for weight loss 

## 2023-01-29 LAB — CBC
HCT: 35.4 % (ref 35.0–45.0)
Hemoglobin: 10.9 g/dL — ABNORMAL LOW (ref 11.7–15.5)
MCH: 22.7 pg — ABNORMAL LOW (ref 27.0–33.0)
MCHC: 30.8 g/dL — ABNORMAL LOW (ref 32.0–36.0)
MCV: 73.8 fL — ABNORMAL LOW (ref 80.0–100.0)
MPV: 10.6 fL (ref 7.5–12.5)
Platelets: 283 10*3/uL (ref 140–400)
RBC: 4.8 10*6/uL (ref 3.80–5.10)
RDW: 17 % — ABNORMAL HIGH (ref 11.0–15.0)
WBC: 9.4 10*3/uL (ref 3.8–10.8)

## 2023-01-29 LAB — COMPLETE METABOLIC PANEL WITH GFR
AG Ratio: 1.7 (calc) (ref 1.0–2.5)
ALT: 9 U/L (ref 6–29)
AST: 11 U/L (ref 10–35)
Albumin: 4.2 g/dL (ref 3.6–5.1)
Alkaline phosphatase (APISO): 82 U/L (ref 37–153)
BUN/Creatinine Ratio: 8 (calc) (ref 6–22)
BUN: 6 mg/dL — ABNORMAL LOW (ref 7–25)
CO2: 30 mmol/L (ref 20–32)
Calcium: 9.5 mg/dL (ref 8.6–10.4)
Chloride: 104 mmol/L (ref 98–110)
Creat: 0.79 mg/dL (ref 0.50–1.03)
Globulin: 2.5 g/dL (calc) (ref 1.9–3.7)
Glucose, Bld: 69 mg/dL (ref 65–99)
Potassium: 3.4 mmol/L — ABNORMAL LOW (ref 3.5–5.3)
Sodium: 143 mmol/L (ref 135–146)
Total Bilirubin: 0.4 mg/dL (ref 0.2–1.2)
Total Protein: 6.7 g/dL (ref 6.1–8.1)
eGFR: 87 mL/min/{1.73_m2} (ref >=60)

## 2023-01-29 LAB — LIPID PANEL
Cholesterol: 158 mg/dL (ref <200)
HDL: 51 mg/dL (ref >=50)
LDL Cholesterol (Calc): 84 mg/dL (calc)
Non-HDL Cholesterol (Calc): 107 mg/dL (calc) (ref <130)
Total CHOL/HDL Ratio: 3.1 (calc) (ref <5.0)
Triglycerides: 124 mg/dL (ref <150)

## 2023-01-29 LAB — HEMOGLOBIN A1C
Hgb A1c MFr Bld: 6.3 % of total Hgb — ABNORMAL HIGH (ref <5.7)
Mean Plasma Glucose: 134 mg/dL
eAG (mmol/L): 7.4 mmol/L

## 2023-02-11 ENCOUNTER — Other Ambulatory Visit: Payer: Self-pay | Admitting: Internal Medicine

## 2023-02-11 DIAGNOSIS — Z1231 Encounter for screening mammogram for malignant neoplasm of breast: Secondary | ICD-10-CM

## 2023-02-18 ENCOUNTER — Ambulatory Visit
Admission: RE | Admit: 2023-02-18 | Discharge: 2023-02-18 | Disposition: A | Discharge status: Home / Self Care | Payer: 59 | Source: Ambulatory Visit | Attending: Internal Medicine | Admitting: Internal Medicine

## 2023-02-18 DIAGNOSIS — Z1231 Encounter for screening mammogram for malignant neoplasm of breast: Secondary | ICD-10-CM | POA: Diagnosis not present

## 2023-04-20 ENCOUNTER — Other Ambulatory Visit: Payer: Self-pay | Admitting: Internal Medicine

## 2023-04-20 DIAGNOSIS — I10 Essential (primary) hypertension: Secondary | ICD-10-CM

## 2023-04-21 NOTE — Telephone Encounter (Signed)
Requested Prescriptions  Pending Prescriptions Disp Refills   amLODipine (NORVASC) 5 MG tablet [Pharmacy Med Name: AMLODIPINE BESYLATE 5MG  TABLETS] 90 tablet 0    Sig: TAKE 1 TABLET(5 MG) BY MOUTH DAILY     Cardiovascular: Calcium Channel Blockers 2 Passed - 04/20/2023 10:51 AM      Passed - Last BP in normal range    BP Readings from Last 1 Encounters:  01/28/23 124/78         Passed - Last Heart Rate in normal range    Pulse Readings from Last 1 Encounters:  01/28/23 64         Passed - Valid encounter within last 6 months    Recent Outpatient Visits           2 months ago Pure hypercholesterolemia   [place] [facility] [place], Salvadore Oxford, NP   9 months ago Encounter for general adult medical examination with abnormal findings   [place] [facility] [place], Salvadore Oxford, NP   1 year ago Mixed hyperlipidemia   [place] [facility] [place], Salvadore Oxford, NP   2 years ago Medicare annual wellness visit, subsequent   [place] [facility] [place], Salvadore Oxford, NP   2 years ago Encounter to establish care with new doctor   [facility] [place], Jodelle Gross, [place]

## 2023-04-27 ENCOUNTER — Other Ambulatory Visit: Payer: Self-pay | Admitting: Internal Medicine

## 2023-04-27 DIAGNOSIS — I10 Essential (primary) hypertension: Secondary | ICD-10-CM

## 2023-04-27 NOTE — Telephone Encounter (Unsigned)
Copied from CRM [id number]. Topic: General - Other >> Apr 27, 2023 10:32 AM Everette C wrote: Reason for CRM: Medication Refill - Medication: amLODipine (NORVASC) 5 MG tablet [[id number]]  Has the patient contacted their pharmacy? Yes.   (Agent: If no, request that the patient contact the pharmacy for the refill. If patient does not wish to contact the pharmacy document the reason why and proceed with request.) (Agent: If yes, when and what did the pharmacy advise?)  Preferred Pharmacy (with phone number or street name): [id number] [place], [place] - [address] AT [facility] OF [address] [place] [address] Phone: [phone] Fax: [phone] Hours: Not open 24 hours   Has the patient been seen for an appointment in the last year OR does the patient have an upcoming appointment? Yes.    Agent: Please be advised that RX refills may take up to 3 business days. We ask that you follow-up with your pharmacy.

## 2023-04-28 NOTE — Telephone Encounter (Signed)
Refused amlodipine 5 mg because this is a duplicate request.

## 2023-05-01 ENCOUNTER — Encounter: Payer: Self-pay | Admitting: Internal Medicine

## 2023-05-01 ENCOUNTER — Other Ambulatory Visit: Payer: Self-pay | Admitting: Internal Medicine

## 2023-05-01 ENCOUNTER — Ambulatory Visit (INDEPENDENT_AMBULATORY_CARE_PROVIDER_SITE_OTHER): Payer: 59 | Admitting: Internal Medicine

## 2023-05-01 ENCOUNTER — Ambulatory Visit: Payer: 59

## 2023-05-01 ENCOUNTER — Ambulatory Visit
Admission: RE | Admit: 2023-05-01 | Discharge: 2023-05-01 | Disposition: A | Discharge status: Home / Self Care | Payer: 59 | Attending: Internal Medicine | Admitting: Internal Medicine

## 2023-05-01 ENCOUNTER — Ambulatory Visit
Admission: RE | Admit: 2023-05-01 | Discharge: 2023-05-01 | Disposition: A | Discharge status: Home / Self Care | Payer: 59 | Source: Ambulatory Visit | Attending: Internal Medicine | Admitting: Internal Medicine

## 2023-05-01 ENCOUNTER — Ambulatory Visit: Payer: Self-pay

## 2023-05-01 VITALS — BP 128/74 | HR 71 | Wt 238.8 lb

## 2023-05-01 DIAGNOSIS — S2242XA Multiple fractures of ribs, left side, initial encounter for closed fracture: Secondary | ICD-10-CM | POA: Diagnosis not present

## 2023-05-01 DIAGNOSIS — R0781 Pleurodynia: Secondary | ICD-10-CM | POA: Diagnosis not present

## 2023-05-01 DIAGNOSIS — W010XXA Fall on same level from slipping, tripping and stumbling without subsequent striking against object, initial encounter: Secondary | ICD-10-CM

## 2023-05-01 DIAGNOSIS — I10 Essential (primary) hypertension: Secondary | ICD-10-CM

## 2023-05-01 DIAGNOSIS — Z981 Arthrodesis status: Secondary | ICD-10-CM | POA: Diagnosis not present

## 2023-05-01 MED ORDER — TRAMADOL HCL 50 MG PO TABS: 50.0000 mg | ORAL_TABLET | Freq: Three times a day (TID) | ORAL | 0 refills | Status: AC | PRN

## 2023-05-01 MED ORDER — METHOCARBAMOL 500 MG PO TABS: 500.0000 mg | ORAL_TABLET | Freq: Three times a day (TID) | ORAL | 0 refills | Status: DC | PRN

## 2023-05-01 NOTE — Progress Notes (Signed)
Subjective:    Patient ID: Courtney Carrillo, female    DOB: 1966-05-18, 57 y.o.   MRN: [id number]  HPI  Pt presents to the clinic today with c/o left side rib pain. This started after a fall that occurred 2 days ago.  She reports she was running after her grandchild in the road when she tripped and fell.  She describes the pain as sore and achy but can be sharp with certain movements.  The pain worsens with taking a deep breath, coughing or sneezing.  She has tried Ibuprofen and warm compresses with minimal relief of symptoms.  Review of Systems  Past Medical History:  Diagnosis Date   Anemia    2015   Anxiety    Depression    Headache    H/O   Hyperlipidemia    Hypertension    Sleep apnea    CPAP    Current Outpatient Medications  Medication Sig Dispense Refill   amLODipine (NORVASC) 5 MG tablet TAKE 1 TABLET(5 MG) BY MOUTH DAILY 90 tablet 0   atorvastatin (LIPITOR) 40 MG tablet TAKE 1 TABLET(40 MG) BY MOUTH EVERY MORNING 90 tablet 2   benztropine (COGENTIN) 1 MG tablet Take 1 mg by mouth every morning.     busPIRone (BUSPAR) 10 MG tablet Take 20 mg by mouth 2 (two) times daily.     doxepin (SINEQUAN) 50 MG capsule Take 50 mg by mouth at bedtime.     hydrochlorothiazide (HYDRODIURIL) 25 MG tablet Take 1 tablet (25 mg total) by mouth daily. 90 tablet 1   hydrOXYzine (ATARAX) 10 MG tablet Take 1 tablet (10 mg total) by mouth 3 (three) times daily as needed. 30 tablet 0   ibuprofen (ADVIL) 800 MG tablet Take 1 tablet (800 mg total) by mouth every 8 (eight) hours as needed. 30 tablet 2   VRAYLAR 6 MG CAPS Take 1 capsule by mouth daily.     No current facility-administered medications for this visit.    Allergies  Allergen Reactions   Lisinopril Cough   Losartan Cough   Other Other (See Comments)    Perfumes : Congestion    Family History  Problem Relation Age of Onset   Diabetes Mother    Breast cancer Sister     Social History   Socioeconomic History   Marital  status: Single    Spouse name: Not on file   Number of children: Not on file   Years of education: Not on file   Highest education level: Not on file  Occupational History   Not on file  Tobacco Use   Smoking status: Former    Current packs/day: 0.50    Average packs/day: 0.5 packs/day for 35.0 years (17.5 ttl pk-yrs)    Types: Cigarettes    Start date: 09/12/2021   Smokeless tobacco: Never  Vaping Use   Vaping status: Never Used  Substance and Sexual Activity   Alcohol use: No   Drug use: Yes    Frequency: 7.0 times per week    Types: Marijuana   Sexual activity: Not on file  Other Topics Concern   Not on file  Social History Narrative   Not on file   Social Determinants of Health   Financial Resource Strain: Low Risk  (12/25/2022)   Overall Financial Resource Strain (CARDIA)    Difficulty of Paying Living Expenses: Not hard at all  Food Insecurity: No Food Insecurity (12/25/2022)   Hunger Vital Sign    Worried About  Running Out of Food in the Last Year: Never true    Ran Out of Food in the Last Year: Never true  Transportation Needs: No Transportation Needs (12/25/2022)   PRAPARE - Administrator, Civil Service (Medical): No    Lack of Transportation (Non-Medical): No  Physical Activity: Inactive (12/25/2022)   Exercise Vital Sign    Days of Exercise per Week: 0 days    Minutes of Exercise per Session: 0 min  Stress: No Stress Concern Present (12/25/2022)   [employer] of Occupational Health - Occupational Stress Questionnaire    Feeling of Stress : Not at all  Social Connections: Unknown (12/25/2022)   Social Connection and Isolation Panel [NHANES]    Frequency of Communication with Friends and Family: Once a week    Frequency of Social Gatherings with Friends and Family: Not on file    Attends Religious Services: More than 4 times per year    Active Member of [employer] or Organizations: No    Attends Banker Meetings: Never    Marital  Status: Never married  Intimate Partner Violence: Not At Risk (12/25/2022)   Humiliation, Afraid, Rape, and Kick questionnaire    Fear of Current or Ex-Partner: No    Emotionally Abused: No    Physically Abused: No    Sexually Abused: No     Constitutional: Denies fever, malaise, fatigue, headache or abrupt weight changes.  Respiratory: Denies difficulty breathing, shortness of breath, cough or sputum production.   Cardiovascular: Denies chest pain, chest tightness, palpitations or swelling in the hands or feet.  Gastrointestinal: Denies abdominal pain, bloating, constipation, diarrhea or blood in the stool.  GU: Denies urgency, frequency, pain with urination, burning sensation, blood in urine, odor or discharge. Musculoskeletal: Pt reports left side rib pain, low back pain. Denies decrease in range of motion, difficulty with gait, muscle pain or joint swelling.  Skin: Denies redness, rashes, lesions or ulcercations.  Neurological: Denies dizziness, difficulty with memory, difficulty with speech or problems with balance and coordination.  Psych: Pt has a history of anxiety and depression. Denies SI/HI.  No other specific complaints in a complete review of systems (except as listed in HPI above).     Objective:   Physical Exam   BP 128/74 (BP Location: Left Arm, Patient Position: Sitting, Cuff Size: Normal)   Pulse 71   Wt 238 lb 12.8 oz (108.3 kg)   SpO2 96%   BMI 40.36 kg/m    Wt Readings from Last 3 Encounters:  01/28/23 237 lb (107.5 kg)  12/25/22 219 lb (99.3 kg)  07/11/22 219 lb (99.3 kg)    General: Appears her stated age, obese, in NAD. Skin: Abrasion noted to left palm.  No abrasion or bruising noted to left side of ribs. Cardiovascular: Normal rate and rhythm. S1,S2 noted.  No murmur, rubs or gallops noted.  Pulmonary/Chest: Normal effort and positive vesicular breath sounds. No respiratory distress. No wheezes, rales or ronchi noted.  Musculoskeletal: Pain with  palpation of the left anterior/lateral lower ribs.  No step-off or deformity noted.  She does have difficulty getting from a sitting to a standing position.  Gait slow and steady without device. Neurological: Alert and oriented.   BMET    Component Value Date/Time   NA 143 01/28/2023 0000   K 3.4 (L) 01/28/2023 0000   CL 104 01/28/2023 0000   CO2 30 01/28/2023 0000   GLUCOSE 69 01/28/2023 0000   BUN 6 (L) 01/28/2023 0000  CREATININE 0.79 01/28/2023 0000   CALCIUM 9.5 01/28/2023 0000   GFRNONAA 74 08/17/2020 0814   GFRAA 86 08/17/2020 0814    Lipid Panel     Component Value Date/Time   CHOL 158 01/28/2023 0000   TRIG 124 01/28/2023 0000   HDL 51 01/28/2023 0000   CHOLHDL 3.1 01/28/2023 0000   LDLCALC 84 01/28/2023 0000    CBC    Component Value Date/Time   WBC 9.4 01/28/2023 0000   RBC 4.80 01/28/2023 0000   HGB 10.9 (L) 01/28/2023 0000   HCT 35.4 01/28/2023 0000   PLT 283 01/28/2023 0000   MCV 73.8 (L) 01/28/2023 0000   MCH 22.7 (L) 01/28/2023 0000   MCHC 30.8 (L) 01/28/2023 0000   RDW 17.0 (H) 01/28/2023 0000   LYMPHSABS 3,106 08/17/2020 0814   EOSABS 251 08/17/2020 0814   BASOSABS 47 08/17/2020 0814    Hgb A1C Lab Results  Component Value Date   HGBA1C 6.3 (H) 01/28/2023           Assessment & Plan:   Left-sided rib pain secondary to fall:  X-ray left ribs today although this does not typically change her management Reassured her that rib contusions can take 2 to 4 [place] to heal and fractures can take 4 to 6 [place] to heal Avoid heavy lifting, anything that could cause you to stumble and fall Okay to continue ibuprofen 600 mg every 8 hours as needed-consume with food Rx for tramadol 50 mg every 8 hours as needed for severe pain Rx for methocarbamol 500 mg every 8 hours as needed for muscle spasms-sedation caution given Advised her to alternate heat and ice  RTC in 3 months for your annual exam Nicki Reaper, NP

## 2023-05-01 NOTE — Patient Instructions (Signed)
Chest Wall Pain Chest wall pain is pain in or around the bones and muscles of your chest. Chest wall pain may be caused by: An injury. Coughing a lot. Using your chest and arm muscles too much. Sometimes, the cause may not be known. This pain may take a few [place] or longer to get better. Follow these instructions at home: Managing pain, stiffness, and swelling If told, put ice on the painful area: Put ice in a plastic bag. Place a towel between your skin and the bag. Leave the ice on for 20 minutes, 2-3 times a day.  Activity Rest as told by your doctor. Avoid doing things that cause pain. This includes lifting heavy items. Ask your doctor what activities are safe for you. General instructions  Take over-the-counter and prescription medicines only as told by your doctor. Do not use any products that contain nicotine or tobacco, such as cigarettes, e-cigarettes, and chewing tobacco. If you need help quitting, ask your doctor. Keep all follow-up visits as told by your doctor. This is important. Contact a doctor if: You have a fever. Your chest pain gets worse. You have new symptoms. Get help right away if: You feel sick to your stomach (nauseous) or you throw up (vomit). You feel sweaty or light-headed. You have a cough with mucus from your lungs (sputum) or you cough up blood. You are short of breath. These symptoms may be an emergency. Do not wait to see if the symptoms will go away. Get medical help right away. Call your local emergency services (911 in the [place] Do not drive yourself to the hospital. Summary Chest wall pain is pain in or around the bones and muscles of your chest. It may be treated with ice, rest, and medicines. Your condition may also get better if you avoid doing things that cause pain. Contact a doctor if you have a fever, chest pain that gets worse, or new symptoms. Get help right away if you feel light-headed or you get short of breath. These symptoms may  be an emergency. This information is not intended to replace advice given to you by your health care provider. Make sure you discuss any questions you have with your health care provider. Document Revised: 07/21/2022 Document Reviewed: 07/21/2022 Elsevier Patient Education  2024 [employer].

## 2023-05-01 NOTE — Telephone Encounter (Signed)
     Chief Complaint: Larey Seat Wednesday, injured left ribs. Asking to be seen when she comes in for flu shot today. Symptoms: Above Frequency: Wednesday Pertinent Negatives: Patient denies  Disposition: [] ED /[] Urgent Care (no appt availability in office) / [] Appointment(In office/virtual)/ []  [place] [facility]/ [] Home Care/ [] Refused Recommended Disposition /[] [facility]/ [x]  Follow-up with PCP Additional Notes: Please advise pt.  Reason for Disposition  MILD weakness (i.e., does not interfere with ability to work, go to school, normal activities)  (Exception: Mild weakness is a chronic symptom.)  Answer Assessment - Initial Assessment Questions 1. MECHANISM: "How did the fall happen?"     Tripped 2. DOMESTIC VIOLENCE AND ELDER ABUSE SCREENING: "Did you fall because someone pushed you or tried to hurt you?" If Yes, ask: "Are you safe now?"     No 3. ONSET: "When did the fall happen?" (e.g., minutes, hours, or days ago)     Wednesday 4. LOCATION: "What part of the body hit the ground?" (e.g., back, buttocks, head, hips, knees, hands, head, stomach)     Left side 5. INJURY: "Did you hurt (injure) yourself when you fell?" If Yes, ask: "What did you injure? Tell me more about this?" (e.g., body area; type of injury; pain severity)"     Yes - left rib 6. PAIN: "Is there any pain?" If Yes, ask: "How bad is the pain?" (e.g., Scale 1-10; or mild,  moderate, severe)   - NONE (0): No pain   - MILD (1-3): Doesn't interfere with normal activities    - MODERATE (4-7): Interferes with normal activities or awakens from sleep    - SEVERE (8-10): Excruciating pain, unable to do any normal activities      10 7. SIZE: For cuts, bruises, or swelling, ask: "How large is it?" (e.g., inches or centimeters)      No 8. PREGNANCY: "Is there any chance you are pregnant?" "When was your last menstrual period?"     No 9. OTHER SYMPTOMS: "Do you have any other symptoms?" (e.g., dizziness, fever,  weakness; new onset or worsening).      No 10. CAUSE: "What do you think caused the fall (or falling)?" (e.g., tripped, dizzy spell)       Tripped.  Protocols used: Falls and [facility]

## 2023-05-01 NOTE — Telephone Encounter (Signed)
Apt today at 1pm.   Thanks,   -Vernona Rieger

## 2023-05-04 ENCOUNTER — Emergency Department: Payer: 59

## 2023-05-04 ENCOUNTER — Other Ambulatory Visit: Payer: Self-pay

## 2023-05-04 ENCOUNTER — Emergency Department
Admission: EM | Admit: 2023-05-04 | Discharge: 2023-05-05 | Disposition: A | Discharge status: Home / Self Care | Payer: 59 | Attending: Emergency Medicine | Admitting: Emergency Medicine

## 2023-05-04 DIAGNOSIS — I10 Essential (primary) hypertension: Secondary | ICD-10-CM | POA: Diagnosis not present

## 2023-05-04 DIAGNOSIS — S29001A Unspecified injury of muscle and tendon of front wall of thorax, initial encounter: Secondary | ICD-10-CM | POA: Diagnosis present

## 2023-05-04 DIAGNOSIS — J181 Lobar pneumonia, unspecified organism: Secondary | ICD-10-CM | POA: Insufficient documentation

## 2023-05-04 DIAGNOSIS — R918 Other nonspecific abnormal finding of lung field: Secondary | ICD-10-CM | POA: Diagnosis not present

## 2023-05-04 DIAGNOSIS — W01198A Fall on same level from slipping, tripping and stumbling with subsequent striking against other object, initial encounter: Secondary | ICD-10-CM | POA: Diagnosis not present

## 2023-05-04 DIAGNOSIS — J189 Pneumonia, unspecified organism: Secondary | ICD-10-CM

## 2023-05-04 DIAGNOSIS — J90 Pleural effusion, not elsewhere classified: Secondary | ICD-10-CM | POA: Diagnosis not present

## 2023-05-04 DIAGNOSIS — Z043 Encounter for examination and observation following other accident: Secondary | ICD-10-CM | POA: Diagnosis not present

## 2023-05-04 DIAGNOSIS — S2242XA Multiple fractures of ribs, left side, initial encounter for closed fracture: Secondary | ICD-10-CM

## 2023-05-04 LAB — CBC WITH DIFFERENTIAL/PLATELET
Abs Immature Granulocytes: 0.05 10*3/uL (ref 0.00–0.07)
Basophils Absolute: 0 10*3/uL (ref 0.0–0.1)
Basophils Relative: 0 %
Eosinophils Absolute: 0.1 10*3/uL (ref 0.0–0.5)
Eosinophils Relative: 0 %
HCT: 31.1 % — ABNORMAL LOW (ref 36.0–46.0)
Hemoglobin: 9.5 g/dL — ABNORMAL LOW (ref 12.0–15.0)
Immature Granulocytes: 0 %
Lymphocytes Relative: 22 %
Lymphs Abs: 3 10*3/uL (ref 0.7–4.0)
MCH: 23.4 pg — ABNORMAL LOW (ref 26.0–34.0)
MCHC: 30.5 g/dL (ref 30.0–36.0)
MCV: 76.6 fL — ABNORMAL LOW (ref 80.0–100.0)
Monocytes Absolute: 0.7 10*3/uL (ref 0.1–1.0)
Monocytes Relative: 5 %
Neutro Abs: 9.9 10*3/uL — ABNORMAL HIGH (ref 1.7–7.7)
Neutrophils Relative %: 73 %
Platelets: 341 10*3/uL (ref 150–400)
RBC: 4.06 MIL/uL (ref 3.87–5.11)
RDW: 16.1 % — ABNORMAL HIGH (ref 11.5–15.5)
WBC: 13.7 10*3/uL — ABNORMAL HIGH (ref 4.0–10.5)
nRBC: 0 % (ref 0.0–0.2)

## 2023-05-04 LAB — BASIC METABOLIC PANEL
Anion gap: 12 (ref 5–15)
BUN: 15 mg/dL (ref 6–20)
CO2: 26 mmol/L (ref 22–32)
Calcium: 8.7 mg/dL — ABNORMAL LOW (ref 8.9–10.3)
Chloride: 102 mmol/L (ref 98–111)
Creatinine, Ser: 0.86 mg/dL (ref 0.44–1.00)
GFR, Estimated: >60 mL/min (ref >60)
Glucose, Bld: 135 mg/dL — ABNORMAL HIGH (ref 70–99)
Potassium: 3.2 mmol/L — ABNORMAL LOW (ref 3.5–5.1)
Sodium: 140 mmol/L (ref 135–145)

## 2023-05-04 LAB — LACTIC ACID, PLASMA: Lactic Acid, Venous: 1.7 mmol/L (ref 0.5–1.9)

## 2023-05-04 NOTE — ED Provider Notes (Signed)
[facility] Provider Note    Event Date/Time   First MD Initiated Contact with Patient 05/04/23 2303     (approximate)   History   Fall   HPI  Courtney Carrillo is a 57 y.o. female   Past medical history of pretension, anemia, hyperlipidemia, who presents emergency department with wheezing, cough, shortness of breath over the last 2 days.  She she had fallen on Wednesday chasing after her granddaughter injured her left side ribs and was evaluated by PCP at that time, got a chest x-ray which she said revealed rib fractures.  She was faring well at home, managing pain okay, was actually at work and ambulating and performing all activities of daily living 1 over the last 2 days developed respiratory symptoms as above.  Left-sided rib pain persists.  Independent Historian contributed to assessment above: Her children at bedside corroborate information past medical history as above  External Medical Documents Reviewed: X-ray of the ribs obtained on 05/01/2023 showing a left-sided fifth and sixth rib fractures without pneumothorax      Physical Exam   Triage Vital Signs: ED Triage Vitals  Encounter Vitals Group     BP 05/04/23 1816 (!) 158/101     Systolic BP Percentile --      Diastolic BP Percentile --      Pulse Rate 05/04/23 1816 77     Resp 05/04/23 1816 20     Temp 05/04/23 1816 98.2 F (36.8 C)     Temp Source 05/04/23 1816 Oral     SpO2 05/04/23 1813 93 %     Weight 05/04/23 1814 238 lb (108 kg)     Height 05/04/23 1814 5\' 1"  (1.549 m)     Head Circumference --      Peak Flow --      Pain Score 05/04/23 1814 10     Pain Loc --      Pain Education --      Exclude from Growth Chart --     Most recent vital signs: Vitals:   05/04/23 1816 05/04/23 2308  BP: (!) 158/101 (!) 144/98  Pulse: 77 86  Resp: 20 (!) 22  Temp: 98.2 F (36.8 C) 98.2 F (36.8 C)  SpO2: 93% 100%    General: Awake, no distress.  CV:  Good peripheral perfusion.   Resp:  Normal effort.  Abd:  No distention.  Other:  Pleasant woman in no acute distress, vital signs within normal limits afebrile with clear lung sounds bilaterally.  No wheezing.   ED Results / Procedures / Treatments   Labs (all labs ordered are listed, but only abnormal results are displayed) Labs Reviewed  BASIC METABOLIC PANEL - Abnormal; Notable for the following components:      Result Value   Potassium 3.2 (*)    Glucose, Bld 135 (*)    Calcium 8.7 (*)    All other components within normal limits  CBC WITH DIFFERENTIAL/PLATELET - Abnormal; Notable for the following components:   WBC 13.7 (*)    Hemoglobin 9.5 (*)    HCT 31.1 (*)    MCV 76.6 (*)    MCH 23.4 (*)    RDW 16.1 (*)    Neutro Abs 9.9 (*)    All other components within normal limits  LACTIC ACID, PLASMA  LACTIC ACID, PLASMA     I ordered and reviewed the above labs they are notable for white blood cell count is elevated at 13.7.  Normal lactic.  EKG  ED ECG REPORT I, Pilar Jarvis, the attending physician, personally viewed and interpreted this ECG.   Date: 05/04/2023  EKG Time: 1823  Rate: 83  Rhythm: nsr  Axis: nl  Intervals:none  ST&T Change: no stemi    RADIOLOGY I independently reviewed and interpreted x-ray of the ribs and see a left-sided opacity concerning for pneumonia I also reviewed radiologist's formal read.   PROCEDURES:  Critical Care performed: No  Procedures   MEDICATIONS ORDERED IN ED: Medications - No data to display  IMPRESSION / MDM / ASSESSMENT AND PLAN / ED COURSE  I reviewed the triage vital signs and the nursing notes.                                Patient's presentation is most consistent with acute presentation with potential threat to life or bodily function.  Differential diagnosis includes, but is not limited to, rib fracture, pneumothorax, pneumonia, PE, ACS  The patient is on the cardiac monitor to evaluate for evidence of arrhythmia and/or  significant heart rate changes.  MDM:    Mechanical slip and fall about a week ago sustained left-sided minimally displaced rib fractures without pneumothorax who presents with respiratory infectious symptoms over the last 2 days likely due to splinting in the setting of rib fractures.  I doubt PE given no immobilization after the fall, was very active afterwards and has no leg swelling or pain.  She very much wishes to be treated at home, we will start her antibiotics.  She was placed on O2 for comfort upon triage, will check ambulatory sats, check labs, and if labs and O2 saturation show no emergent abnormalities they will be discharged on antibiotics as she wishes.  Workup unremarkable, dc w abx, and incentive spirometer     FINAL CLINICAL IMPRESSION(S) / ED DIAGNOSES   Final diagnoses:  Closed fracture of multiple ribs of left side, initial encounter  Community acquired pneumonia of left lower lobe of lung     Rx / DC Orders   ED Discharge Orders     None        Note:  This document was prepared using Dragon voice recognition software and may include unintentional dictation errors.    Pilar Jarvis, MD 05/04/23 Joseph Pierini    Pilar Jarvis, MD 05/05/23 [phone]

## 2023-05-04 NOTE — Telephone Encounter (Signed)
Requested Prescriptions  Pending Prescriptions Disp Refills   amLODipine (NORVASC) 5 MG tablet [Pharmacy Med Name: AMLODIPINE BESYLATE 5MG  TABLETS] 90 tablet 1    Sig: TAKE 1 TABLET(5 MG) BY MOUTH DAILY     Cardiovascular: Calcium Channel Blockers 2 Passed - 05/01/2023  3:24 PM      Passed - Last BP in normal range    BP Readings from Last 1 Encounters:  05/01/23 128/74         Passed - Last Heart Rate in normal range    Pulse Readings from Last 1 Encounters:  05/01/23 71         Passed - Valid encounter within last 6 months    Recent Outpatient Visits           3 days ago Rib pain on left side   [place] [facility] [place], Salvadore Oxford, NP   3 months ago Pure hypercholesterolemia   [place] [facility] [place], Salvadore Oxford, NP   9 months ago Encounter for general adult medical examination with abnormal findings   [place] [facility] [place], Salvadore Oxford, NP   1 year ago Mixed hyperlipidemia   [place] [facility] [place], Salvadore Oxford, NP   2 years ago Medicare annual wellness visit, subsequent   [facility] [place], Salvadore Oxford, [place]

## 2023-05-04 NOTE — ED Notes (Signed)
Pt audible wheezy. Placed on 2L [place] for comfort.

## 2023-05-04 NOTE — Telephone Encounter (Signed)
Called pharmacy. They do not have rx.

## 2023-05-04 NOTE — ED Triage Notes (Signed)
Pt POV to ER. Mechanical fall Wednesday and went to dr for scans. States the scan showed fractured ribs. Today she started feeling SOB so came to ER. States her head was hit but did not LOC. No blood thinners

## 2023-05-05 DIAGNOSIS — I2510 Atherosclerotic heart disease of native coronary artery without angina pectoris: Secondary | ICD-10-CM | POA: Diagnosis present

## 2023-05-05 DIAGNOSIS — F419 Anxiety disorder, unspecified: Secondary | ICD-10-CM | POA: Diagnosis present

## 2023-05-05 DIAGNOSIS — Z833 Family history of diabetes mellitus: Secondary | ICD-10-CM | POA: Diagnosis not present

## 2023-05-05 DIAGNOSIS — J918 Pleural effusion in other conditions classified elsewhere: Secondary | ICD-10-CM | POA: Diagnosis present

## 2023-05-05 DIAGNOSIS — W19XXXA Unspecified fall, initial encounter: Secondary | ICD-10-CM | POA: Diagnosis present

## 2023-05-05 DIAGNOSIS — J189 Pneumonia, unspecified organism: Secondary | ICD-10-CM | POA: Diagnosis present

## 2023-05-05 DIAGNOSIS — I10 Essential (primary) hypertension: Secondary | ICD-10-CM | POA: Diagnosis present

## 2023-05-05 DIAGNOSIS — G4733 Obstructive sleep apnea (adult) (pediatric): Secondary | ICD-10-CM | POA: Diagnosis present

## 2023-05-05 DIAGNOSIS — D638 Anemia in other chronic diseases classified elsewhere: Secondary | ICD-10-CM | POA: Diagnosis present

## 2023-05-05 DIAGNOSIS — R0602 Shortness of breath: Secondary | ICD-10-CM | POA: Diagnosis present

## 2023-05-05 DIAGNOSIS — Z6841 Body Mass Index (BMI) 40.0 and over, adult: Secondary | ICD-10-CM | POA: Diagnosis not present

## 2023-05-05 DIAGNOSIS — Z79899 Other long term (current) drug therapy: Secondary | ICD-10-CM | POA: Diagnosis not present

## 2023-05-05 DIAGNOSIS — S2242XA Multiple fractures of ribs, left side, initial encounter for closed fracture: Secondary | ICD-10-CM | POA: Diagnosis present

## 2023-05-05 DIAGNOSIS — J9601 Acute respiratory failure with hypoxia: Secondary | ICD-10-CM | POA: Diagnosis present

## 2023-05-05 DIAGNOSIS — Z87891 Personal history of nicotine dependence: Secondary | ICD-10-CM | POA: Diagnosis not present

## 2023-05-05 DIAGNOSIS — R0902 Hypoxemia: Secondary | ICD-10-CM | POA: Diagnosis not present

## 2023-05-05 DIAGNOSIS — Z888 Allergy status to other drugs, medicaments and biological substances status: Secondary | ICD-10-CM | POA: Diagnosis not present

## 2023-05-05 DIAGNOSIS — F32A Depression, unspecified: Secondary | ICD-10-CM | POA: Diagnosis present

## 2023-05-05 DIAGNOSIS — J209 Acute bronchitis, unspecified: Secondary | ICD-10-CM | POA: Diagnosis present

## 2023-05-05 DIAGNOSIS — Z803 Family history of malignant neoplasm of breast: Secondary | ICD-10-CM | POA: Diagnosis not present

## 2023-05-05 DIAGNOSIS — E876 Hypokalemia: Secondary | ICD-10-CM | POA: Diagnosis present

## 2023-05-05 DIAGNOSIS — J90 Pleural effusion, not elsewhere classified: Secondary | ICD-10-CM | POA: Diagnosis not present

## 2023-05-05 DIAGNOSIS — R0689 Other abnormalities of breathing: Secondary | ICD-10-CM | POA: Diagnosis not present

## 2023-05-05 DIAGNOSIS — R918 Other nonspecific abnormal finding of lung field: Secondary | ICD-10-CM | POA: Diagnosis not present

## 2023-05-05 DIAGNOSIS — J9811 Atelectasis: Secondary | ICD-10-CM | POA: Diagnosis not present

## 2023-05-05 DIAGNOSIS — E785 Hyperlipidemia, unspecified: Secondary | ICD-10-CM | POA: Diagnosis present

## 2023-05-05 DIAGNOSIS — J869 Pyothorax without fistula: Secondary | ICD-10-CM | POA: Diagnosis not present

## 2023-05-05 MED ORDER — AZITHROMYCIN 250 MG PO TABS: ORAL_TABLET | ORAL | 0 refills | Status: DC

## 2023-05-05 MED ORDER — AMOXICILLIN-POT CLAVULANATE 875-125 MG PO TABS: 1.0000 | ORAL_TABLET | Freq: Two times a day (BID) | ORAL | 0 refills | Status: DC

## 2023-05-05 MED ORDER — LIDOCAINE 5 % EX PTCH: 1.0000 | MEDICATED_PATCH | Freq: Two times a day (BID) | CUTANEOUS | 0 refills | Status: DC

## 2023-05-05 MED ORDER — LIDOCAINE 5 % EX PTCH
1.0000 | MEDICATED_PATCH | CUTANEOUS | Status: DC
  Administered 2023-05-05: 1 via TRANSDERMAL
  Filled 2023-05-05: qty 1

## 2023-05-05 MED ORDER — AMOXICILLIN-POT CLAVULANATE 875-125 MG PO TABS
1.0000 | ORAL_TABLET | Freq: Once | ORAL | Status: AC
  Administered 2023-05-05: 1 via ORAL
  Filled 2023-05-05: qty 1

## 2023-05-05 MED ORDER — AZITHROMYCIN 500 MG PO TABS
500.0000 mg | ORAL_TABLET | Freq: Once | ORAL | Status: AC
  Administered 2023-05-05: 500 mg via ORAL
  Filled 2023-05-05: qty 1

## 2023-05-05 NOTE — Discharge Instructions (Addendum)
Take acetaminophen 650 mg and ibuprofen 400 mg every 6 hours for pain.  Take with food. Take tramadol prescribed by your primary doctor for severe/breakthrough pain only.  Use pain patches.   Take antibiotics.   Thank you for choosing [place] for your health care today!  Please see your primary doctor this week for a follow up appointment.   If you have any new, worsening, or unexpected symptoms call your doctor right away or come back to the emergency department for reevaluation.  It was my pleasure to care for you today.   Daneil Dan Modesto Charon, MD

## 2023-05-05 NOTE — ED Notes (Signed)
SpO2 94-100% with ambulation

## 2023-05-08 ENCOUNTER — Other Ambulatory Visit: Payer: Self-pay

## 2023-05-08 ENCOUNTER — Emergency Department: Payer: 59

## 2023-05-08 ENCOUNTER — Inpatient Hospital Stay
Admission: EM | Admit: 2023-05-08 | Discharge: 2023-05-11 | DRG: 193 | Disposition: A | Discharge status: Home / Self Care | Payer: 59 | Attending: Internal Medicine | Admitting: Internal Medicine

## 2023-05-08 DIAGNOSIS — J918 Pleural effusion in other conditions classified elsewhere: Secondary | ICD-10-CM | POA: Diagnosis present

## 2023-05-08 DIAGNOSIS — F411 Generalized anxiety disorder: Secondary | ICD-10-CM | POA: Diagnosis present

## 2023-05-08 DIAGNOSIS — Z803 Family history of malignant neoplasm of breast: Secondary | ICD-10-CM

## 2023-05-08 DIAGNOSIS — J869 Pyothorax without fistula: Secondary | ICD-10-CM | POA: Diagnosis present

## 2023-05-08 DIAGNOSIS — J9601 Acute respiratory failure with hypoxia: Secondary | ICD-10-CM

## 2023-05-08 DIAGNOSIS — Z6841 Body Mass Index (BMI) 40.0 and over, adult: Secondary | ICD-10-CM | POA: Diagnosis present

## 2023-05-08 DIAGNOSIS — J189 Pneumonia, unspecified organism: Secondary | ICD-10-CM | POA: Diagnosis present

## 2023-05-08 DIAGNOSIS — R0602 Shortness of breath: Secondary | ICD-10-CM | POA: Diagnosis present

## 2023-05-08 DIAGNOSIS — W19XXXA Unspecified fall, initial encounter: Secondary | ICD-10-CM | POA: Diagnosis present

## 2023-05-08 DIAGNOSIS — E6609 Other obesity due to excess calories: Secondary | ICD-10-CM | POA: Diagnosis present

## 2023-05-08 DIAGNOSIS — R0902 Hypoxemia: Secondary | ICD-10-CM | POA: Insufficient documentation

## 2023-05-08 DIAGNOSIS — G4733 Obstructive sleep apnea (adult) (pediatric): Secondary | ICD-10-CM | POA: Diagnosis present

## 2023-05-08 DIAGNOSIS — E876 Hypokalemia: Secondary | ICD-10-CM | POA: Diagnosis present

## 2023-05-08 DIAGNOSIS — E785 Hyperlipidemia, unspecified: Secondary | ICD-10-CM | POA: Diagnosis present

## 2023-05-08 DIAGNOSIS — I10 Essential (primary) hypertension: Secondary | ICD-10-CM | POA: Diagnosis present

## 2023-05-08 DIAGNOSIS — I2510 Atherosclerotic heart disease of native coronary artery without angina pectoris: Secondary | ICD-10-CM | POA: Diagnosis present

## 2023-05-08 DIAGNOSIS — S2242XA Multiple fractures of ribs, left side, initial encounter for closed fracture: Secondary | ICD-10-CM | POA: Diagnosis present

## 2023-05-08 DIAGNOSIS — E66812 Obesity, class 2: Secondary | ICD-10-CM | POA: Diagnosis present

## 2023-05-08 DIAGNOSIS — F419 Anxiety disorder, unspecified: Secondary | ICD-10-CM | POA: Diagnosis present

## 2023-05-08 DIAGNOSIS — Z87891 Personal history of nicotine dependence: Secondary | ICD-10-CM | POA: Diagnosis not present

## 2023-05-08 DIAGNOSIS — Z833 Family history of diabetes mellitus: Secondary | ICD-10-CM | POA: Diagnosis not present

## 2023-05-08 DIAGNOSIS — Z79899 Other long term (current) drug therapy: Secondary | ICD-10-CM

## 2023-05-08 DIAGNOSIS — Z888 Allergy status to other drugs, medicaments and biological substances status: Secondary | ICD-10-CM | POA: Diagnosis not present

## 2023-05-08 DIAGNOSIS — E66813 Body mass index (BMI) 40.0-44.9, adult: Secondary | ICD-10-CM | POA: Diagnosis present

## 2023-05-08 DIAGNOSIS — J209 Acute bronchitis, unspecified: Secondary | ICD-10-CM | POA: Diagnosis present

## 2023-05-08 DIAGNOSIS — J90 Pleural effusion, not elsewhere classified: Secondary | ICD-10-CM | POA: Diagnosis not present

## 2023-05-08 DIAGNOSIS — D638 Anemia in other chronic diseases classified elsewhere: Secondary | ICD-10-CM

## 2023-05-08 DIAGNOSIS — F32A Depression, unspecified: Secondary | ICD-10-CM | POA: Diagnosis present

## 2023-05-08 DIAGNOSIS — J9811 Atelectasis: Secondary | ICD-10-CM | POA: Diagnosis not present

## 2023-05-08 LAB — CBC WITH DIFFERENTIAL/PLATELET
Abs Immature Granulocytes: 0.1 10*3/uL — ABNORMAL HIGH (ref 0.00–0.07)
Basophils Absolute: 0.1 10*3/uL (ref 0.0–0.1)
Basophils Relative: 0 %
Eosinophils Absolute: 0.1 10*3/uL (ref 0.0–0.5)
Eosinophils Relative: 1 %
HCT: 27.6 % — ABNORMAL LOW (ref 36.0–46.0)
Hemoglobin: 9 g/dL — ABNORMAL LOW (ref 12.0–15.0)
Immature Granulocytes: 1 %
Lymphocytes Relative: 21 %
Lymphs Abs: 3.5 10*3/uL (ref 0.7–4.0)
MCH: 23.9 pg — ABNORMAL LOW (ref 26.0–34.0)
MCHC: 32.6 g/dL (ref 30.0–36.0)
MCV: 73.4 fL — ABNORMAL LOW (ref 80.0–100.0)
Monocytes Absolute: 1 10*3/uL (ref 0.1–1.0)
Monocytes Relative: 6 %
Neutro Abs: 12.4 10*3/uL — ABNORMAL HIGH (ref 1.7–7.7)
Neutrophils Relative %: 71 %
Platelets: 478 10*3/uL — ABNORMAL HIGH (ref 150–400)
RBC: 3.76 MIL/uL — ABNORMAL LOW (ref 3.87–5.11)
RDW: 15.9 % — ABNORMAL HIGH (ref 11.5–15.5)
WBC: 17.1 10*3/uL — ABNORMAL HIGH (ref 4.0–10.5)
nRBC: 0.6 % — ABNORMAL HIGH (ref 0.0–0.2)

## 2023-05-08 LAB — RETICULOCYTES
Immature Retic Fract: 38.5 % — ABNORMAL HIGH (ref 2.3–15.9)
RBC.: 3.85 MIL/uL — ABNORMAL LOW (ref 3.87–5.11)
Retic Count, Absolute: 95.1 10*3/uL (ref 19.0–186.0)
Retic Ct Pct: 2.5 % (ref 0.4–3.1)

## 2023-05-08 LAB — PROCALCITONIN: Procalcitonin: 0.11 ng/mL

## 2023-05-08 LAB — COMPREHENSIVE METABOLIC PANEL
ALT: 38 U/L (ref 0–44)
AST: 42 U/L — ABNORMAL HIGH (ref 15–41)
Albumin: 3.4 g/dL — ABNORMAL LOW (ref 3.5–5.0)
Alkaline Phosphatase: 82 U/L (ref 38–126)
Anion gap: 12 (ref 5–15)
BUN: 7 mg/dL (ref 6–20)
CO2: 33 mmol/L — ABNORMAL HIGH (ref 22–32)
Calcium: 9 mg/dL (ref 8.9–10.3)
Chloride: 92 mmol/L — ABNORMAL LOW (ref 98–111)
Creatinine, Ser: 0.8 mg/dL (ref 0.44–1.00)
GFR, Estimated: >60 mL/min (ref >60)
Glucose, Bld: 173 mg/dL — ABNORMAL HIGH (ref 70–99)
Potassium: 2.7 mmol/L — CL (ref 3.5–5.1)
Sodium: 137 mmol/L (ref 135–145)
Total Bilirubin: 0.5 mg/dL (ref 0.3–1.2)
Total Protein: 7.5 g/dL (ref 6.5–8.1)

## 2023-05-08 LAB — BLOOD GAS, VENOUS
Acid-Base Excess: 10.6 mmol/L — ABNORMAL HIGH (ref 0.0–2.0)
Bicarbonate: 35.7 mmol/L — ABNORMAL HIGH (ref 20.0–28.0)
O2 Saturation: 83.7 %
Patient temperature: 37
pCO2, Ven: 48 mm[Hg] (ref 44–60)
pH, Ven: 7.48 — ABNORMAL HIGH (ref 7.25–7.43)
pO2, Ven: 52 mm[Hg] — ABNORMAL HIGH (ref 32–45)

## 2023-05-08 LAB — MAGNESIUM: Magnesium: 2.3 mg/dL (ref 1.7–2.4)

## 2023-05-08 LAB — IRON AND TIBC
Iron: 31 ug/dL (ref 28–170)
Saturation Ratios: 11 % (ref 10.4–31.8)
TIBC: 274 ug/dL (ref 250–450)
UIBC: 243 ug/dL

## 2023-05-08 LAB — FERRITIN: Ferritin: 479 ng/mL — ABNORMAL HIGH (ref 11–307)

## 2023-05-08 LAB — BRAIN NATRIURETIC PEPTIDE: B Natriuretic Peptide: 32.8 pg/mL (ref 0.0–100.0)

## 2023-05-08 LAB — LACTIC ACID, PLASMA: Lactic Acid, Venous: 1.4 mmol/L (ref 0.5–1.9)

## 2023-05-08 MED ORDER — METRONIDAZOLE 500 MG/100ML IV SOLN
500.0000 mg | Freq: Two times a day (BID) | INTRAVENOUS | Status: DC
  Administered 2023-05-08 – 2023-05-09 (×2): 500 mg via INTRAVENOUS
  Filled 2023-05-08 (×3): qty 100

## 2023-05-08 MED ORDER — POTASSIUM CHLORIDE 10 MEQ/100ML IV SOLN
10.0000 meq | Freq: Once | INTRAVENOUS | Status: AC
  Administered 2023-05-08: 10 meq via INTRAVENOUS
  Filled 2023-05-08: qty 100

## 2023-05-08 MED ORDER — METHOCARBAMOL 500 MG PO TABS
500.0000 mg | ORAL_TABLET | Freq: Three times a day (TID) | ORAL | Status: DC | PRN
  Administered 2023-05-09: 500 mg via ORAL
  Filled 2023-05-08: qty 1

## 2023-05-08 MED ORDER — GUAIFENESIN ER 600 MG PO TB12
1200.0000 mg | ORAL_TABLET | Freq: Two times a day (BID) | ORAL | Status: DC
  Administered 2023-05-08 – 2023-05-11 (×6): 1200 mg via ORAL
  Filled 2023-05-08 (×6): qty 2

## 2023-05-08 MED ORDER — ENOXAPARIN SODIUM 40 MG/0.4ML IJ SOSY
40.0000 mg | PREFILLED_SYRINGE | INTRAMUSCULAR | Status: DC
  Administered 2023-05-08: 40 mg via SUBCUTANEOUS
  Filled 2023-05-08: qty 0.4

## 2023-05-08 MED ORDER — ONDANSETRON HCL 4 MG/2ML IJ SOLN: 4.0000 mg | Freq: Four times a day (QID) | INTRAMUSCULAR | Status: DC | PRN

## 2023-05-08 MED ORDER — SODIUM CHLORIDE 0.9 % IV SOLN
2.0000 g | INTRAVENOUS | Status: DC
  Administered 2023-05-08 – 2023-05-10 (×3): 2 g via INTRAVENOUS
  Filled 2023-05-08 (×4): qty 20

## 2023-05-08 MED ORDER — PREDNISONE 20 MG PO TABS
40.0000 mg | ORAL_TABLET | Freq: Every day | ORAL | Status: DC
  Administered 2023-05-08 – 2023-05-09 (×2): 40 mg via ORAL
  Filled 2023-05-08 (×2): qty 2

## 2023-05-08 MED ORDER — BUSPIRONE HCL 10 MG PO TABS
20.0000 mg | ORAL_TABLET | Freq: Two times a day (BID) | ORAL | Status: DC
  Administered 2023-05-08 – 2023-05-11 (×6): 20 mg via ORAL
  Filled 2023-05-08 (×6): qty 2

## 2023-05-08 MED ORDER — POTASSIUM CHLORIDE CRYS ER 20 MEQ PO TBCR
40.0000 meq | EXTENDED_RELEASE_TABLET | ORAL | Status: AC
  Administered 2023-05-08 (×2): 40 meq via ORAL
  Filled 2023-05-08 (×2): qty 2

## 2023-05-08 MED ORDER — ATORVASTATIN CALCIUM 20 MG PO TABS
40.0000 mg | ORAL_TABLET | Freq: Every day | ORAL | Status: DC
  Administered 2023-05-09 – 2023-05-11 (×3): 40 mg via ORAL
  Filled 2023-05-08 (×3): qty 2

## 2023-05-08 MED ORDER — ALBUTEROL SULFATE (2.5 MG/3ML) 0.083% IN NEBU: 2.5000 mg | INHALATION_SOLUTION | RESPIRATORY_TRACT | Status: DC | PRN

## 2023-05-08 MED ORDER — ALBUTEROL SULFATE (2.5 MG/3ML) 0.083% IN NEBU
2.5000 mg | INHALATION_SOLUTION | Freq: Once | RESPIRATORY_TRACT | Status: AC
  Administered 2023-05-08: 2.5 mg via RESPIRATORY_TRACT
  Filled 2023-05-08: qty 3

## 2023-05-08 MED ORDER — SODIUM CHLORIDE 0.9 % IV SOLN
2.0000 g | INTRAVENOUS | Status: DC
Start: 2023-05-09 — End: 2023-05-08

## 2023-05-08 MED ORDER — ONDANSETRON HCL 4 MG PO TABS
4.0000 mg | ORAL_TABLET | Freq: Four times a day (QID) | ORAL | Status: DC | PRN
  Filled 2023-05-08: qty 1

## 2023-05-08 MED ORDER — ACETAMINOPHEN 650 MG RE SUPP: 650.0000 mg | Freq: Four times a day (QID) | RECTAL | Status: DC | PRN

## 2023-05-08 MED ORDER — BENZONATATE 100 MG PO CAPS: 200.0000 mg | ORAL_CAPSULE | Freq: Three times a day (TID) | ORAL | Status: DC | PRN

## 2023-05-08 MED ORDER — SODIUM CHLORIDE 0.9 % IV SOLN
500.0000 mg | INTRAVENOUS | Status: DC
  Administered 2023-05-08: 500 mg via INTRAVENOUS
  Filled 2023-05-08: qty 5

## 2023-05-08 MED ORDER — BUDESONIDE 0.5 MG/2ML IN SUSP
2.0000 mg | Freq: Two times a day (BID) | RESPIRATORY_TRACT | Status: DC
  Administered 2023-05-08: 2 mg via RESPIRATORY_TRACT
  Administered 2023-05-09: 0.5 mg via RESPIRATORY_TRACT
  Filled 2023-05-08 (×2): qty 8

## 2023-05-08 MED ORDER — HYDROXYZINE HCL 10 MG PO TABS: 10.0000 mg | ORAL_TABLET | Freq: Three times a day (TID) | ORAL | Status: DC | PRN

## 2023-05-08 MED ORDER — ACETAMINOPHEN 325 MG PO TABS
650.0000 mg | ORAL_TABLET | Freq: Four times a day (QID) | ORAL | Status: DC | PRN
  Administered 2023-05-09: 650 mg via ORAL
  Filled 2023-05-08: qty 2

## 2023-05-08 MED ORDER — AMLODIPINE BESYLATE 5 MG PO TABS
5.0000 mg | ORAL_TABLET | Freq: Every day | ORAL | Status: DC
  Administered 2023-05-09 – 2023-05-11 (×3): 5 mg via ORAL
  Filled 2023-05-08 (×3): qty 1

## 2023-05-08 MED ORDER — CARIPRAZINE HCL 1.5 MG PO CAPS
6.0000 mg | ORAL_CAPSULE | Freq: Every day | ORAL | Status: DC
  Administered 2023-05-09 – 2023-05-11 (×3): 6 mg via ORAL
  Filled 2023-05-08 (×3): qty 4

## 2023-05-08 MED ORDER — BENZTROPINE MESYLATE 1 MG PO TABS
1.0000 mg | ORAL_TABLET | Freq: Every morning | ORAL | Status: DC
  Administered 2023-05-09 – 2023-05-11 (×3): 1 mg via ORAL
  Filled 2023-05-08 (×3): qty 1

## 2023-05-08 MED ORDER — POTASSIUM CHLORIDE 10 MEQ/100ML IV SOLN
10.0000 meq | Freq: Once | INTRAVENOUS | Status: AC
  Administered 2023-05-08: 10 meq via INTRAVENOUS

## 2023-05-08 MED ORDER — IPRATROPIUM-ALBUTEROL 0.5-2.5 (3) MG/3ML IN SOLN
3.0000 mL | Freq: Four times a day (QID) | RESPIRATORY_TRACT | Status: DC
  Administered 2023-05-08: 3 mL via RESPIRATORY_TRACT
  Filled 2023-05-08: qty 3

## 2023-05-08 MED ORDER — SENNOSIDES-DOCUSATE SODIUM 8.6-50 MG PO TABS: 1.0000 | ORAL_TABLET | Freq: Every evening | ORAL | Status: DC | PRN

## 2023-05-08 MED ORDER — AZITHROMYCIN 250 MG PO TABS: 250.0000 mg | ORAL_TABLET | Freq: Every day | ORAL | Status: DC

## 2023-05-08 MED ORDER — DOXEPIN HCL 50 MG PO CAPS
50.0000 mg | ORAL_CAPSULE | Freq: Every day | ORAL | Status: DC
  Administered 2023-05-09 – 2023-05-10 (×2): 50 mg via ORAL
  Filled 2023-05-08 (×4): qty 1

## 2023-05-08 MED ORDER — PREDNISONE 20 MG PO TABS: 40.0000 mg | ORAL_TABLET | Freq: Every day | ORAL | Status: DC

## 2023-05-08 NOTE — H&P (Signed)
History and Physical    Courtney Carrillo HYQ:[id number] DOB: [birth year] DOA: 05/08/2023  PCP: Lorre Munroe, NP (Confirm with patient/family/NH records and if not entered, this has to be entered at [facility] point of entry) Patient coming from: Home  I have personally briefly reviewed patient's old medical records in [facility]  Chief Complaint: Cough, wheezing, SOB  HPI: Courtney Carrillo is a 57 y.o. female with medical history significant of morbid obesity, HTN, HLD, OSA not on CPAP, anxiety/depression, chronic macrocytic anemia, presented with worsening of cough wheezing and shortness of breath.  Patient sustained a mechanical fall 9 days ago on left chest and started to feel sharp like pain and went to see PCP, " x-ray showed rib fracture" and patient was given pain medications and reassured and sent home.  Chest pain resolved Saturday through Sunday.  Monday, patient woke up with shortness of breath and wheezing and a dry cough came to ED, physical exam found patient was wheezing and x-ray showed left-sided infiltrates and small to moderate pleural effusion and patient was discharged home with Augmentin and azithromycin.  However patient overall not feeling much better, continues to have a dry cough and shortness of breath and episode of chills.  This morning, even walking from parking lot to her office she felt severe shortness of breath.  She also started to cough up some whitish phlegm and felt episode chills.  EMS was called and it was found patient is hypoxic with 84% on room air and patient was placed on NRB then 3 L of 93%.  Patient is a non-smoker denies any history of asthma or COPD.  Denies any history of CHF or CAD before no history of stress test.  ED Course: Temperature 99.1, tachycardia 103, blood pressure 130/80, O2 saturation 97% on room air.  CT chest showed large left-sided pleural effusion.  Blood work showed WBC 17, hemoglobin 9.0 compared to baseline 9.5, K2.7, bicarb 33,  creatinine 0.8  Patient was given ceftriaxone and azithromycin in the ED.  Review of Systems: As per HPI otherwise 14 point review of systems negative.    Past Medical History:  Diagnosis Date   Anemia    2015   Anxiety    Depression    Headache    H/O   Hyperlipidemia    Hypertension    Sleep apnea    CPAP    Past Surgical History:  Procedure Laterality Date   BACK SURGERY     LUMBAR   CARPAL TUNNEL RELEASE Right 06/19/2015   Procedure: CARPAL TUNNEL RELEASE;  Surgeon: Kennedy Bucker, MD;  Location: [facility] ORS;  Service: Orthopedics;  Laterality: Right;   COLONOSCOPY WITH PROPOFOL N/A 01/28/2021   Procedure: COLONOSCOPY WITH PROPOFOL;  Surgeon: Toney Reil, MD;  Location[facility] ENDOSCOPY;  Service: Gastroenterology;  Laterality: N/A;   DILATION AND CURETTAGE OF UTERUS     FLEXIBLE SIGMOIDOSCOPY N/A 04/10/2021   Procedure: FLEXIBLE SIGMOIDOSCOPY;  Surgeon: Toney Reil, MD;  Location: [facility] ENDOSCOPY;  Service: Gastroenterology;  Laterality: N/A;     reports that she has quit smoking. Her smoking use included cigarettes. She started smoking about 19 months ago. She has a 17.5 pack-year smoking history. She has never used smokeless tobacco. She reports current drug use. Frequency: 7.00 times per week. Drug: Marijuana. She reports that she does not drink alcohol.  Allergies  Allergen Reactions   Lisinopril Cough   Losartan Cough   Other Other (See Comments)    Perfumes :  Congestion    Family History  Problem Relation Age of Onset   Diabetes Mother    Breast cancer Sister     Prior to Admission medications   Medication Sig Start Date End Date Taking? Authorizing Provider  amLODipine (NORVASC) 5 MG tablet TAKE 1 TABLET(5 MG) BY MOUTH DAILY 05/04/23   Lorre Munroe, NP  amoxicillin-clavulanate (AUGMENTIN) 875-125 MG tablet Take 1 tablet by mouth 2 (two) times daily for 5 days. 05/05/23 05/10/23  Pilar Jarvis, MD  atorvastatin (LIPITOR) 40 MG tablet TAKE 1  TABLET(40 MG) BY MOUTH EVERY MORNING 05/05/22   Lorre Munroe, NP  azithromycin (ZITHROMAX Z-PAK) 250 MG tablet Take 1 tablet for the next four days. 05/05/23 05/10/23  Pilar Jarvis, MD  benztropine (COGENTIN) 1 MG tablet Take 1 mg by mouth every morning. 12/04/20   [provider]  busPIRone (BUSPAR) 10 MG tablet Take 20 mg by mouth 2 (two) times daily. 07/23/20   [provider]  doxepin (SINEQUAN) 50 MG capsule Take 50 mg by mouth at bedtime. 12/04/20   [provider]  hydrochlorothiazide (HYDRODIURIL) 25 MG tablet Take 1 tablet (25 mg total) by mouth daily. 08/16/20   Malfi, Jodelle Gross, FNP  hydrOXYzine (ATARAX) 10 MG tablet Take 1 tablet (10 mg total) by mouth 3 (three) times daily as needed. 01/28/23   Lorre Munroe, NP  ibuprofen (ADVIL) 800 MG tablet Take 1 tablet (800 mg total) by mouth every 8 (eight) hours as needed. 12/16/21   Lorre Munroe, NP  lidocaine (LIDODERM) 5 % Place 1 patch onto the skin every 12 (twelve) hours. Remove & Discard patch within 12 hours or as directed by MD 05/05/23 05/04/24  Pilar Jarvis, MD  methocarbamol (ROBAXIN) 500 MG tablet Take 1 tablet (500 mg total) by mouth every 8 (eight) hours as needed for muscle spasms. 05/01/23   Lorre Munroe, NP  VRAYLAR 6 MG CAPS Take 1 capsule by mouth daily. 07/23/20   [provider]    Physical Exam: Vitals:   05/08/23 1508 05/08/23 1630 05/08/23 1700 05/08/23 1732  BP: 136/83 (!) 120/90 (!) 153/79   Pulse:  (!) 101 (!) 103   Resp:  16 (!) 22   Temp:    99.1 F (37.3 C)  TempSrc:      SpO2:    97%  Weight:      Height:        Constitutional: NAD, calm, comfortable Vitals:   05/08/23 1508 05/08/23 1630 05/08/23 1700 05/08/23 1732  BP: 136/83 (!) 120/90 (!) 153/79   Pulse:  (!) 101 (!) 103   Resp:  16 (!) 22   Temp:    99.1 F (37.3 C)  TempSrc:      SpO2:    97%  Weight:      Height:       Eyes: PERRL, lids and conjunctivae normal ENMT: Mucous membranes are moist. Posterior  pharynx clear of any exudate or lesions.Normal dentition.  Neck: normal, supple, no masses, no thyromegaly Respiratory: Diminished breathing sound on the left lower field, diffused wheezing, scattered crackles on bilateral bases left > right, increasing l respiratory effort. No accessory muscle use.  Cardiovascular: Regular rate and rhythm, no murmurs / rubs / gallops. No extremity edema. 2+ pedal pulses. No carotid bruits.  Abdomen: no tenderness, no masses palpated. No hepatosplenomegaly. Bowel sounds positive.  Musculoskeletal: no clubbing / cyanosis. No joint deformity upper and lower extremities. Good ROM, no contractures. Normal muscle tone.  Skin: no rashes, lesions, ulcers. No induration Neurologic: CN 2-12 grossly intact. Sensation intact, DTR normal. Strength 5/5 in all 4.  Psychiatric: Normal judgment and insight. Alert and oriented x 3. Normal mood.     Labs on Admission: I have personally reviewed following labs and imaging studies  CBC: Recent Labs  Lab 05/04/23 2327 05/08/23 1511  WBC 13.7* 17.1*  NEUTROABS 9.9* 12.4*  HGB 9.5* 9.0*  HCT 31.1* 27.6*  MCV 76.6* 73.4*  PLT 341 478*   Basic Metabolic Panel: Recent Labs  Lab 05/04/23 2327 05/08/23 1511  NA 140 137  K 3.2* 2.7*  CL 102 92*  CO2 26 33*  GLUCOSE 135* 173*  BUN 15 7  CREATININE 0.86 0.80  CALCIUM 8.7* 9.0   GFR: Estimated Creatinine Clearance: 87.9 mL/min (by C-G formula based on SCr of 0.8 mg/dL). Liver Function Tests: Recent Labs  Lab 05/08/23 1511  AST 42*  ALT 38  ALKPHOS 82  BILITOT 0.5  PROT 7.5  ALBUMIN 3.4*   No results for input(s): "LIPASE", "AMYLASE" in the last 168 hours. No results for input(s): "AMMONIA" in the last 168 hours. Coagulation Profile: No results for input(s): "INR", "PROTIME" in the last 168 hours. Cardiac Enzymes: No results for input(s): "CKTOTAL", "CKMB", "CKMBINDEX", "TROPONINI" in the last 168 hours. BNP (last 3 results) No results for input(s):  "PROBNP" in the last 8760 hours. HbA1C: No results for input(s): "HGBA1C" in the last 72 hours. CBG: No results for input(s): "GLUCAP" in the last 168 hours. Lipid Profile: No results for input(s): "CHOL", "HDL", "LDLCALC", "TRIG", "CHOLHDL", "LDLDIRECT" in the last 72 hours. Thyroid Function Tests: No results for input(s): "TSH", "T4TOTAL", "FREET4", "T3FREE", "THYROIDAB" in the last 72 hours. Anemia Panel: Recent Labs    05/08/23 1611  RETICCTPCT 2.5   Urine analysis: No results found for: "COLORURINE", "APPEARANCEUR", "LABSPEC", "PHURINE", "GLUCOSEU", "HGBUR", "BILIRUBINUR", "KETONESUR", "PROTEINUR", "UROBILINOGEN", "NITRITE", "LEUKOCYTESUR"  Radiological Exams on Admission: DG Chest 2 View  Result Date: 05/08/2023 CLINICAL DATA:  Shortness of breath, low O2 sats. EXAM: CHEST - 2 VIEW COMPARISON:  05/04/2023 FINDINGS: Enlarging large left pleural effusion. Left lower lobe atelectasis or infiltrate. Right lung clear. Heart and mediastinal contours within normal limits. No acute bony abnormality. IMPRESSION: Enlarging large left pleural effusion with left lower lobe atelectasis or infiltrate. Electronically Signed   By: Charlett Nose M.D.   On: 05/08/2023 17:15   CT CHEST WO CONTRAST  Result Date: 05/08/2023 CLINICAL DATA:  Pneumonia, complication suspected, xray done. Low O2 sats. Shortness of breath. EXAM: CT CHEST WITHOUT CONTRAST TECHNIQUE: Multidetector CT imaging of the chest was performed following the standard protocol without IV contrast. RADIATION DOSE REDUCTION: This exam was performed according to the departmental dose-optimization program which includes automated exposure control, adjustment of the mA and/or kV according to patient size and/or use of iterative reconstruction technique. COMPARISON:  Chest x-ray today FINDINGS: Cardiovascular: Heart is normal size. Aorta is normal caliber. Mediastinum/Nodes: No mediastinal, hilar, or axillary adenopathy. Trachea and esophagus are  unremarkable. Thyroid unremarkable. Lungs/Pleura: Large left pleural effusion. Compressive atelectasis or consolidation in the left lower lobe. No confluent opacity or effusion on the right. Upper Abdomen: No acute findings Musculoskeletal: Chest wall soft tissues are unremarkable. No acute bony abnormality. IMPRESSION: Large left pleural effusion. Left lower lobe consolidation or compressive atelectasis. Electronically Signed   By: Charlett Nose M.D.   On: 05/08/2023 17:15    EKG: Independently reviewed.  Sinus tachycardia, no acute ST changes.  Assessment/Plan Principal Problem:  Empyema (HCC) Active Problems:   OSA (obstructive sleep apnea)   Hypoxia   CAP (community acquired pneumonia)  (please populate well all problems here in Problem List. (For example, if patient is on BP meds at home and you resume or decide to hold them, it is a problem that needs to be her. Same for CAD, COPD, HLD and so on)  Acute hypoxic respiratory failure -Multifactorial, secondary to effect of CAP, failed outpatient management as well as large left-sided pleural effusion rule out empyema. -Treatment plan as below -Holding care, breathing treatment, incentive spirometry and flutter valve  CAP left lower lobe, failed outpatient management -Unclear diuresis of left-sided pneumonia with flow and recent rib fracture -Escalate antibiotics coverage, continue ceftriaxone and azithromycin, add Flagyl -MRSA screening  Large left-sided pleural effusion -Rule out empyema vs parapneumic  -Ordered IR thoracentesis -ABX coverage as above -Check procalcitonin -Other Ddx, check proBNP, consider further etiology studies such as echocardiogram if pleural effusion implying noninfectious etiology. H/H stable,hemothorax unlikely.  Severe hypokalemia -IV and p.o. replacement -Recheck K level tomorrow, magnesium level pending  Acute bronchitis -No history of asthma or COPD, clinically suspect acute bronchitis secondary to  pneumonia -DuoNebs every 6 hours and as needed albuterol -ICS and short course of p.o. prednisone  HTN -Blood pressure borderline low, hold off amlodipine and HCTZ today  Anxiety/depression -Continue BuSpar, doxepin and Cogentin  DVT prophylaxis: lovenox Code Status: Full code Family Communication: Daughter at bedside Disposition Plan: Patient is sick with CAP failed outpatient management with large pleural effusion, requiring IV ABX and IR procedure, expect more than 2 midnight hospital stay Consults called: IR Admission status: Tele admit   Emeline General MD [facility] Pager [phone]  05/08/2023, 5:56 PM

## 2023-05-08 NOTE — ED Provider Notes (Signed)
[facility] Provider Note    None    (approximate)   History   Shortness of Breath   HPI  Courtney Carrillo is a 57 y.o. female with a history of hypertension, hyperlipidemia, and anemia who presents with shortness of breath, gradual onset over the last several days, persistent course, and associated with a cough productive of clear sputum.  The patient denies any chest or back pain.  She has no fever or chills.  She denies any leg swelling.  She sustained rib fractures earlier this month and was diagnosed with pneumonia 4 days ago in the ED and started on antibiotics.  She states that she has been compliant with them.  She states the pain from the rib fractures has resolved.  I reviewed the past medical records.  The patient was seen in the ED on 9/23 for persistent chest wall pain related to rib fractures, and was diagnosed with left basilar atelectasis versus infiltrate and started on antibiotics.  Previously she was seen by family medicine on 9/20 at which time the left posterior lateral fifth and sixth rib fractures were initially diagnosed.   Physical Exam   Triage Vital Signs: ED Triage Vitals  Encounter Vitals Group     BP      Systolic BP Percentile      Diastolic BP Percentile      Pulse      Resp      Temp      Temp src      SpO2      Weight      Height      Head Circumference      Peak Flow      Pain Score      Pain Loc      Pain Education      Exclude from Growth Chart     Most recent vital signs: Vitals:   05/08/23 1700 05/08/23 1732  BP: (!) 153/79   Pulse: (!) 103   Resp: (!) 22   Temp:  99.1 F (37.3 C)  SpO2:  97%     General: Awake, no distress.  CV:  Good peripheral perfusion.  Resp:  Normal effort.  Diminished breath sounds bilaterally, scattered wheezes. Abd:  No distention.  Other:  No peripheral edema.   ED Results / Procedures / Treatments   Labs (all labs ordered are listed, but only abnormal results are  displayed) Labs Reviewed  COMPREHENSIVE METABOLIC PANEL - Abnormal; Notable for the following components:      Result Value   Potassium 2.7 (*)    Chloride 92 (*)    CO2 33 (*)    Glucose, Bld 173 (*)    Albumin 3.4 (*)    AST 42 (*)    All other components within normal limits  CBC WITH DIFFERENTIAL/PLATELET - Abnormal; Notable for the following components:   WBC 17.1 (*)    RBC 3.76 (*)    Hemoglobin 9.0 (*)    HCT 27.6 (*)    MCV 73.4 (*)    MCH 23.9 (*)    RDW 15.9 (*)    Platelets 478 (*)    nRBC 0.6 (*)    Neutro Abs 12.4 (*)    Abs Immature Granulocytes 0.10 (*)    All other components within normal limits  RETICULOCYTES - Abnormal; Notable for the following components:   RBC. 3.85 (*)    Immature Retic Fract 38.5 (*)    All  other components within normal limits  EXPECTORATED SPUTUM ASSESSMENT W GRAM STAIN, RFLX TO RESP C  LACTIC ACID, PLASMA  URINALYSIS, W/ REFLEX TO CULTURE (INFECTION SUSPECTED)  IRON AND TIBC  FERRITIN  PROCALCITONIN  HIV ANTIBODY (ROUTINE TESTING W REFLEX)  CBC  BASIC METABOLIC PANEL     EKG    RADIOLOGY  Chest x-ray: I independently viewed and interpreted the images; left lower lobe is opacified  CT chest:  IMPRESSION:  Large left pleural effusion. Left lower lobe consolidation or  compressive atelectasis.    PROCEDURES:  Critical Care performed: No  Procedures   MEDICATIONS ORDERED IN ED: Medications  potassium chloride 10 mEq in 100 mL IVPB (10 mEq Intravenous New Bag/Given 05/08/23 1731)  cefTRIAXone (ROCEPHIN) 2 g in sodium chloride 0.9 % 100 mL IVPB (2 g Intravenous New Bag/Given 05/08/23 1651)  azithromycin (ZITHROMAX) 500 mg in sodium chloride 0.9 % 250 mL IVPB (has no administration in time range)  azithromycin (ZITHROMAX) tablet 250 mg (has no administration in time range)  amLODipine (NORVASC) tablet 5 mg (has no administration in time range)  atorvastatin (LIPITOR) tablet 40 mg (has no administration in time  range)  busPIRone (BUSPAR) tablet 20 mg (has no administration in time range)  doxepin (SINEQUAN) capsule 50 mg (has no administration in time range)  hydrOXYzine (ATARAX) tablet 10 mg (has no administration in time range)  Cariprazine HCl CAPS 6 mg (has no administration in time range)  benztropine (COGENTIN) tablet 1 mg (has no administration in time range)  methocarbamol (ROBAXIN) tablet 500 mg (has no administration in time range)  metroNIDAZOLE (FLAGYL) IVPB 500 mg (has no administration in time range)  cefTRIAXone (ROCEPHIN) 2 g in sodium chloride 0.9 % 100 mL IVPB (has no administration in time range)  enoxaparin (LOVENOX) injection 40 mg (has no administration in time range)  budesonide (PULMICORT) nebulizer solution 2 mg (has no administration in time range)  predniSONE (DELTASONE) tablet 40 mg (has no administration in time range)  ipratropium-albuterol (DUONEB) 0.5-2.5 (3) MG/3ML nebulizer solution 3 mL (has no administration in time range)  albuterol (PROVENTIL) (2.5 MG/3ML) 0.083% nebulizer solution 2.5 mg (has no administration in time range)  guaiFENesin (MUCINEX) 12 hr tablet 1,200 mg (has no administration in time range)  benzonatate (TESSALON) capsule 200 mg (has no administration in time range)  acetaminophen (TYLENOL) tablet 650 mg (has no administration in time range)    Or  acetaminophen (TYLENOL) suppository 650 mg (has no administration in time range)  ondansetron (ZOFRAN) tablet 4 mg (has no administration in time range)    Or  ondansetron (ZOFRAN) injection 4 mg (has no administration in time range)  senna-docusate (Senokot-S) tablet 1 tablet (has no administration in time range)  albuterol (PROVENTIL) (2.5 MG/3ML) 0.083% nebulizer solution 2.5 mg (2.5 mg Nebulization Given 05/08/23 1548)  albuterol (PROVENTIL) (2.5 MG/3ML) 0.083% nebulizer solution 2.5 mg (2.5 mg Nebulization Given 05/08/23 1548)  potassium chloride 10 mEq in 100 mL IVPB (0 mEq Intravenous Stopped  05/08/23 1732)     IMPRESSION / MDM / ASSESSMENT AND PLAN / ED COURSE  I reviewed the triage vital signs and the nursing notes.  57 year old female with PMH as noted above and status post recent left posterior lateral rib fractures and subsequent atelectasis/pneumonia, on antibiotics, presents with worsening shortness of breath.  On exam the patient has hypoxic to the mid 80s on room air and in the high 90s on 3 L.  She has no acute respiratory distress.  Breath sounds  are diminished bilaterally with some wheezes.  Differential diagnosis includes, but is not limited to, worsened atelectasis/pneumonia, acute bronchitis, pneumothorax.  There is no evidence of cardiac etiology.  We will give bronchodilators, obtain x-ray, lab workup, and reassess.  Patient's presentation is most consistent with acute presentation with potential threat to life or bodily function.  The patient is on the cardiac monitor to evaluate for evidence of arrhythmia and/or significant heart rate changes.  ----------------------------------------- 5:51 PM on 05/08/2023 -----------------------------------------  Chest x-ray showed opacification of the left lower lobe with likely effusion.  I obtained a CT which confirms pleural effusion and lower lobe consolidation.  I doubt hemothorax given that it has now been 9 days since the initial trauma and the effusion has developed over that time.  The patient has no pain at this time.  Given the worsening pneumonia, effusion, and need for supplemental oxygen, she will need admission.  I consulted Dr. Chipper Herb from the hospitalist service; based on our discussion he agrees to evaluate the patient for admission.   FINAL CLINICAL IMPRESSION(S) / ED DIAGNOSES   Final diagnoses:  Pleural effusion  Community acquired pneumonia of left lower lobe of lung     Rx / DC Orders   ED Discharge Orders     None        Note:  This document was prepared using Carrillo voice recognition  software and may include unintentional dictation errors.    Dionne Bucy, MD 05/08/23 1753

## 2023-05-08 NOTE — ED Triage Notes (Signed)
Initial ra 84  Put on NRB and went up to 97%   Ra now 89%   PT to ED via [facility] with complaints Sob. Per EMS, pt was walking into work and when she got to the building, pt was unable to catch her breath. Per EMS, RA sats were 84%. Pt was then placed on NRB and brought sats up to 97%. Ems put pt on 3L and maintained 97% on 3L. Pt had fall last Wednesday and diagnosed with multiple rib fractures on L side. On Monday 9/23 pt was diagnosed with lung infection and treated with antibiotics. Pt speaking in full sentences at this time. Pt RA saturation on arrival 89%.  EMS vitals: HR 104 97%  3L [place] BP 131/83 Temp 97.8

## 2023-05-08 NOTE — ED Notes (Signed)
Pt transported to CT ?

## 2023-05-08 NOTE — ED Notes (Signed)
Pt transported to Xray. 

## 2023-05-09 ENCOUNTER — Inpatient Hospital Stay: Payer: 59

## 2023-05-09 DIAGNOSIS — D638 Anemia in other chronic diseases classified elsewhere: Secondary | ICD-10-CM

## 2023-05-09 DIAGNOSIS — F32A Depression, unspecified: Secondary | ICD-10-CM

## 2023-05-09 DIAGNOSIS — J918 Pleural effusion in other conditions classified elsewhere: Secondary | ICD-10-CM

## 2023-05-09 DIAGNOSIS — E876 Hypokalemia: Secondary | ICD-10-CM | POA: Insufficient documentation

## 2023-05-09 DIAGNOSIS — J189 Pneumonia, unspecified organism: Secondary | ICD-10-CM | POA: Diagnosis not present

## 2023-05-09 DIAGNOSIS — E785 Hyperlipidemia, unspecified: Secondary | ICD-10-CM

## 2023-05-09 DIAGNOSIS — I10 Essential (primary) hypertension: Secondary | ICD-10-CM

## 2023-05-09 DIAGNOSIS — F419 Anxiety disorder, unspecified: Secondary | ICD-10-CM

## 2023-05-09 DIAGNOSIS — J9601 Acute respiratory failure with hypoxia: Secondary | ICD-10-CM

## 2023-05-09 DIAGNOSIS — G4733 Obstructive sleep apnea (adult) (pediatric): Secondary | ICD-10-CM

## 2023-05-09 LAB — CBC
HCT: 24 % — ABNORMAL LOW (ref 36.0–46.0)
Hemoglobin: 7.8 g/dL — ABNORMAL LOW (ref 12.0–15.0)
MCH: 23.7 pg — ABNORMAL LOW (ref 26.0–34.0)
MCHC: 32.5 g/dL (ref 30.0–36.0)
MCV: 72.9 fL — ABNORMAL LOW (ref 80.0–100.0)
Platelets: 425 10*3/uL — ABNORMAL HIGH (ref 150–400)
RBC: 3.29 MIL/uL — ABNORMAL LOW (ref 3.87–5.11)
RDW: 16 % — ABNORMAL HIGH (ref 11.5–15.5)
WBC: 15 10*3/uL — ABNORMAL HIGH (ref 4.0–10.5)
nRBC: 0.4 % — ABNORMAL HIGH (ref 0.0–0.2)

## 2023-05-09 LAB — BASIC METABOLIC PANEL
Anion gap: 11 (ref 5–15)
BUN: 7 mg/dL (ref 6–20)
CO2: 32 mmol/L (ref 22–32)
Calcium: 9.1 mg/dL (ref 8.9–10.3)
Chloride: 99 mmol/L (ref 98–111)
Creatinine, Ser: 0.67 mg/dL (ref 0.44–1.00)
GFR, Estimated: >60 mL/min (ref >60)
Glucose, Bld: 149 mg/dL — ABNORMAL HIGH (ref 70–99)
Potassium: 4.2 mmol/L (ref 3.5–5.1)
Sodium: 142 mmol/L (ref 135–145)

## 2023-05-09 LAB — BODY FLUID CELL COUNT WITH DIFFERENTIAL
Eos, Fluid: 3 %
Lymphs, Fluid: 32 %
Monocyte-Macrophage-Serous Fluid: 19 %
Neutrophil Count, Fluid: 46 %
Total Nucleated Cell Count, Fluid: 2820 mm3

## 2023-05-09 LAB — PROTEIN, PLEURAL OR PERITONEAL FLUID: Total protein, fluid: 5 g/dL

## 2023-05-09 LAB — LACTATE DEHYDROGENASE, PLEURAL OR PERITONEAL FLUID: LD, Fluid: 743 U/L — ABNORMAL HIGH (ref 3–23)

## 2023-05-09 LAB — HIV ANTIBODY (ROUTINE TESTING W REFLEX): HIV Screen 4th Generation wRfx: NONREACTIVE

## 2023-05-09 MED ORDER — BUDESONIDE 0.5 MG/2ML IN SUSP
0.5000 mg | Freq: Two times a day (BID) | RESPIRATORY_TRACT | Status: AC
  Administered 2023-05-09 – 2023-05-11 (×4): 0.5 mg via RESPIRATORY_TRACT
  Filled 2023-05-09 (×4): qty 2

## 2023-05-09 MED ORDER — IPRATROPIUM-ALBUTEROL 0.5-2.5 (3) MG/3ML IN SOLN
3.0000 mL | Freq: Four times a day (QID) | RESPIRATORY_TRACT | Status: DC
  Administered 2023-05-09: 3 mL via RESPIRATORY_TRACT
  Filled 2023-05-09: qty 3

## 2023-05-09 MED ORDER — IPRATROPIUM-ALBUTEROL 0.5-2.5 (3) MG/3ML IN SOLN
3.0000 mL | Freq: Three times a day (TID) | RESPIRATORY_TRACT | Status: DC
  Administered 2023-05-09 – 2023-05-11 (×6): 3 mL via RESPIRATORY_TRACT
  Filled 2023-05-09 (×6): qty 3

## 2023-05-09 MED ORDER — AZITHROMYCIN 500 MG PO TABS
500.0000 mg | ORAL_TABLET | Freq: Every day | ORAL | Status: DC
  Administered 2023-05-09 – 2023-05-10 (×2): 500 mg via ORAL
  Filled 2023-05-09 (×2): qty 1

## 2023-05-09 MED ORDER — LIDOCAINE HCL (PF) 1 % IJ SOLN
10.0000 mL | Freq: Once | INTRAMUSCULAR | Status: AC
  Administered 2023-05-09: 10 mL via INTRADERMAL
  Filled 2023-05-09: qty 10

## 2023-05-09 NOTE — Assessment & Plan Note (Signed)
Replaced

## 2023-05-09 NOTE — Assessment & Plan Note (Signed)
Ferritin up at 479 and hemoglobin 8.4.  Follow-up as outpatient.

## 2023-05-09 NOTE — Assessment & Plan Note (Signed)
-   Continue amlodipine ?

## 2023-05-09 NOTE — Assessment & Plan Note (Signed)
-  Continue Lipitor °

## 2023-05-09 NOTE — Hospital Course (Signed)
57 year old female with morbid obesity, hypertension, hyperlipidemia, sleep apnea, anxiety depression, chronic anemia presented with worsening cough and shortness of breath.  Patient had a fall 9 days ago and had a sharp pain and x-ray showed a rib fracture.  Patient woke up Monday with shortness of breath and wheezing and x-ray showed infiltrates and moderate pleural effusion patient was given Augmentin and Zithromax.  Patient not feeling any better.  EMS found the patient to be hypoxic with a pulse ox of 84%.  9/28.  Patient feeling better with just oxygen and IV antibiotics.  Thoracentesis performed and 1 L was removed. 9/29.  Patient feeling better.  Able to come off oxygen.  Still has slight cough. 9/30.  Patient wants to go home.  No growth after thoracentesis culture for 1 day.  Will give Augmentin for 2 Ghan upon going home.

## 2023-05-09 NOTE — Plan of Care (Signed)

## 2023-05-09 NOTE — Progress Notes (Addendum)
Progress Note   Patient: Courtney Carrillo [id number]:[id number] DOB: 13-May-1966 DOA: 05/08/2023     1 DOS: the patient was seen and examined on 05/09/2023   Brief hospital course: 57 year old female with morbid obesity, hypertension, hyperlipidemia, sleep apnea, anxiety depression, chronic anemia presented with worsening cough and shortness of breath.  Patient had a fall 9 days ago and had a sharp pain and x-ray showed a rib fracture.  Patient woke up Monday with shortness of breath and wheezing and x-ray showed infiltrates and moderate pleural effusion patient was given Augmentin and Zithromax.  Patient not feeling any better.  EMS found the patient to be hypoxic with a pulse ox of 84%.  9/28.  Patient feeling better with just oxygen and IV antibiotics.  I spoke with interventional radiologist about performing a thoracentesis.  Thoracentesis may have to wait till Monday as per interventional radiologist.  Assessment and Plan: * Acute respiratory failure with hypoxia (HCC) Pulse ox of 84% by EMS.  Currently on 2 L.  Parapneumonic effusion CT scan showing large left pleural effusion.  Continue Rocephin and Zithromax.  Spoke with interventional radiologist about performing a thoracentesis.  Since it is the weekend, thoracentesis may end up being pushed off till Monday.  Anemia of chronic disease Ferritin up at 479 and hemoglobin 7.8.  Continue to monitor hemoglobin and need for transfusion.  Obesity, Class III, BMI 40-49.9 (morbid obesity) (HCC) BMI listed as 44.95  OSA (obstructive sleep apnea) Not on CPAP  Hypokalemia Replaced  Essential hypertension Continue amlodipine  Anxiety and depression Continue BuSpar and doxepin  Hyperlipidemia Continue Lipitor        Subjective: Patient feeling better after oxygen placed.  Patient has a large left pleural effusion.  Physical Exam: Vitals:   05/08/23 2039 05/08/23 2340 05/09/23 0731 05/09/23 0827  BP:  (!) 125/102  (!) 116/56  Pulse:   95  81  Resp:  (!) 24  18  Temp:  98.5 F (36.9 C)  97.8 F (36.6 C)  TempSrc:      SpO2: 96% 98% 98% 90%  Weight:      Height:       Physical Exam HENT:     Head: Normocephalic.     Mouth/Throat:     Pharynx: No oropharyngeal exudate.  Eyes:     General: Lids are normal.     Conjunctiva/sclera: Conjunctivae normal.  Cardiovascular:     Rate and Rhythm: Normal rate and regular rhythm.     Heart sounds: Normal heart sounds, S1 normal and S2 normal.  Pulmonary:     Breath sounds: Examination of the left-middle field reveals decreased breath sounds and rhonchi. Examination of the left-lower field reveals decreased breath sounds and rhonchi. Decreased breath sounds and rhonchi present. No wheezing or rales.  Abdominal:     Palpations: Abdomen is soft.     Tenderness: There is no abdominal tenderness.  Musculoskeletal:     Right lower leg: Swelling present.     Left lower leg: Swelling present.  Skin:    General: Skin is warm.     Findings: No rash.  Neurological:     Mental Status: She is alert and oriented to person, place, and time.     Data Reviewed: CT scan of the chest showing a large left pleural effusion and left lower lobe consolidation Creatinine 0.67, potassium 4.2 White blood cell count 15.0, hemoglobin 7.8, platelet count 425, ferritin 479.  Family Communication: Spoke with family at bedside  Disposition: Status  is: Inpatient Remains inpatient appropriate because: Will need a thoracentesis.  Continue IV antibiotics  Planned Discharge Destination: Home    Time spent: 28 minutes Spoke with interventional radiology.  Author: Alford Highland, MD 05/09/2023 1:38 PM  For on call review [email or web address].

## 2023-05-09 NOTE — Assessment & Plan Note (Signed)
CT scan showing large left pleural effusion.  Patient received Rocephin and Zithromax here.  Thoracentesis consistent with exudate with pleural LDH being very elevated and pleural protein to serum greater than 0.5.  Thoracentesis culture negative for 1 day.  Could have an element of trauma and bleeding into the peritoneal space from rib fracture.  Will give 2 Paino worth of Augmentin upon discharge.

## 2023-05-09 NOTE — Plan of Care (Signed)
  Problem: Coping: Goal: Level of anxiety will decrease 05/09/2023 0051 by Danford Bad, RN Outcome: Progressing 05/09/2023 0050 by Danford Bad, RN Outcome: Progressing   Problem: Elimination: Goal: Will not experience complications related to bowel motility 05/09/2023 0051 by Danford Bad, RN Outcome: Progressing 05/09/2023 0050 by Danford Bad, RN Outcome: Progressing Goal: Will not experience complications related to urinary retention 05/09/2023 0051 by Danford Bad, RN Outcome: Progressing 05/09/2023 0050 by Danford Bad, RN Outcome: Progressing   Problem: Pain Managment: Goal: General experience of comfort will improve 05/09/2023 0051 by Danford Bad, RN Outcome: Progressing 05/09/2023 0050 by Danford Bad, RN Outcome: Progressing   Problem: Safety: Goal: Ability to remain free from injury will improve 05/09/2023 0051 by Danford Bad, RN Outcome: Progressing 05/09/2023 0050 by Danford Bad, RN Outcome: Progressing   Problem: Skin Integrity: Goal: Risk for impaired skin integrity will decrease 05/09/2023 0051 by Danford Bad, RN Outcome: Progressing 05/09/2023 0050 by Danford Bad, RN Outcome: Progressing

## 2023-05-09 NOTE — Assessment & Plan Note (Signed)
Pulse ox of 84% by EMS.  Currently on 2 L.

## 2023-05-09 NOTE — Assessment & Plan Note (Signed)
Continue Vraylar, BuSpar and doxepin

## 2023-05-09 NOTE — Progress Notes (Signed)
PHARMACIST - PHYSICIAN COMMUNICATION  CONCERNING: Antibiotic IV to Oral Route Change Policy  RECOMMENDATION: This patient is receiving azithromycin by the intravenous route.  Based on criteria approved by the Pharmacy and Therapeutics Committee, the antibiotic(s) is/are being converted to the equivalent oral dose form(s).  DESCRIPTION: These criteria include: Patient being treated for a respiratory tract infection, urinary tract infection, cellulitis or clostridium difficile associated diarrhea if on metronidazole The patient is not neutropenic and does not exhibit a GI malabsorption state The patient is eating (either orally or via tube) and/or has been taking other orally administered medications for a least 24 hours The patient is improving clinically and has a Tmax < 100.5  If you have questions about this conversion, please contact the Pharmacy Department   Tressie Ellis 05/09/23

## 2023-05-09 NOTE — Plan of Care (Signed)
  Problem: Respiratory: Goal: Ability to maintain a clear airway will improve Outcome: Progressing   Problem: Education: Goal: Knowledge of General Education information will improve Description: Including pain rating scale, medication(s)/side effects and non-pharmacologic comfort measures Outcome: Progressing   Problem: Clinical Measurements: Goal: Will remain free from infection Outcome: Progressing Goal: Respiratory complications will improve Outcome: Progressing

## 2023-05-09 NOTE — TOC CM/SW Note (Signed)
Transition of Care [facility]) - Inpatient Brief Assessment   Patient Details  Name: Courtney Carrillo MRN: [id number] Date of Birth: 06-04-1966  Transition of Care [facility]) CM/SW Contact:    Kemper Durie, RN Phone Number: 05/09/2023, 12:25 PM   Clinical Narrative:  Brief assessment done, no TOC needs identified.  Please place consult for any TOC needs.  Transition of Care Asessment: Insurance and Status: Insurance coverage has been reviewed Patient has primary care physician: Yes Home environment has been reviewed: Yes Prior level of function:: Independent Prior/Current Home Services: No current home services Social Determinants of Health Reivew: SDOH reviewed no interventions necessary Readmission risk has been reviewed: Yes Transition of care needs: no transition of care needs at this time

## 2023-05-09 NOTE — Assessment & Plan Note (Signed)
BMI listed as 44.95

## 2023-05-10 DIAGNOSIS — J9601 Acute respiratory failure with hypoxia: Secondary | ICD-10-CM | POA: Diagnosis not present

## 2023-05-10 DIAGNOSIS — J189 Pneumonia, unspecified organism: Secondary | ICD-10-CM | POA: Diagnosis not present

## 2023-05-10 DIAGNOSIS — E876 Hypokalemia: Secondary | ICD-10-CM

## 2023-05-10 DIAGNOSIS — D638 Anemia in other chronic diseases classified elsewhere: Secondary | ICD-10-CM | POA: Diagnosis not present

## 2023-05-10 LAB — CBC
HCT: 24.1 % — ABNORMAL LOW (ref 36.0–46.0)
Hemoglobin: 7.7 g/dL — ABNORMAL LOW (ref 12.0–15.0)
MCH: 23.9 pg — ABNORMAL LOW (ref 26.0–34.0)
MCHC: 32 g/dL (ref 30.0–36.0)
MCV: 74.8 fL — ABNORMAL LOW (ref 80.0–100.0)
Platelets: 476 10*3/uL — ABNORMAL HIGH (ref 150–400)
RBC: 3.22 MIL/uL — ABNORMAL LOW (ref 3.87–5.11)
RDW: 16.2 % — ABNORMAL HIGH (ref 11.5–15.5)
WBC: 18.1 10*3/uL — ABNORMAL HIGH (ref 4.0–10.5)
nRBC: 0.7 % — ABNORMAL HIGH (ref 0.0–0.2)

## 2023-05-10 LAB — URINALYSIS, W/ REFLEX TO CULTURE (INFECTION SUSPECTED)
Bacteria, UA: NONE SEEN
Bilirubin Urine: NEGATIVE
Glucose, UA: NEGATIVE mg/dL
Hgb urine dipstick: NEGATIVE
Ketones, ur: NEGATIVE mg/dL
Leukocytes,Ua: NEGATIVE
Nitrite: NEGATIVE
Protein, ur: NEGATIVE mg/dL
Specific Gravity, Urine: 1.018 (ref 1.005–1.030)
pH: 5 (ref 5.0–8.0)

## 2023-05-10 LAB — BASIC METABOLIC PANEL
Anion gap: 9 (ref 5–15)
BUN: 10 mg/dL (ref 6–20)
CO2: 30 mmol/L (ref 22–32)
Calcium: 8.8 mg/dL — ABNORMAL LOW (ref 8.9–10.3)
Chloride: 100 mmol/L (ref 98–111)
Creatinine, Ser: 0.76 mg/dL (ref 0.44–1.00)
GFR, Estimated: >60 mL/min (ref >60)
Glucose, Bld: 154 mg/dL — ABNORMAL HIGH (ref 70–99)
Potassium: 3.2 mmol/L — ABNORMAL LOW (ref 3.5–5.1)
Sodium: 139 mmol/L (ref 135–145)

## 2023-05-10 LAB — TYPE AND SCREEN
ABO/RH(D): O POS
Antibody Screen: NEGATIVE

## 2023-05-10 MED ORDER — POTASSIUM CHLORIDE CRYS ER 20 MEQ PO TBCR
20.0000 meq | EXTENDED_RELEASE_TABLET | Freq: Once | ORAL | Status: AC
  Administered 2023-05-10: 20 meq via ORAL
  Filled 2023-05-10: qty 1

## 2023-05-10 NOTE — Progress Notes (Signed)
Progress Note   Patient: Courtney Carrillo [id number] DOB: 1966/06/22 DOA: 05/08/2023     2 DOS: the patient was seen and examined on 05/10/2023   Brief hospital course: 57 year old female with morbid obesity, hypertension, hyperlipidemia, sleep apnea, anxiety depression, chronic anemia presented with worsening cough and shortness of breath.  Patient had a fall 9 days ago and had a sharp pain and x-ray showed a rib fracture.  Patient woke up Monday with shortness of breath and wheezing and x-ray showed infiltrates and moderate pleural effusion patient was given Augmentin and Zithromax.  Patient not feeling any better.  EMS found the patient to be hypoxic with a pulse ox of 84%.  9/28.  Patient feeling better with just oxygen and IV antibiotics.  Thoracentesis performed and 1 L was removed. 9/29.  Patient feeling better.  Able to come off oxygen.  Still has slight cough.  Assessment and Plan: * Acute respiratory failure with hypoxia (HCC) Pulse ox of 84% by EMS.  Able to come off oxygen today.  Parapneumonic effusion CT scan showing large left pleural effusion.  Continue Rocephin and Zithromax.  Thoracentesis consistent with exudate with pleural LDH being very elevated and pleural protein to serum greater than 0.5.  Continue antibiotics today and reassess tomorrow.  Could have an element of trauma and bleeding into the peritoneal space from rib fracture.  White blood cell count elevated today at 18.1.  Anemia of chronic disease Ferritin up at 479 and hemoglobin 7.7.  Continue to monitor hemoglobin and need for transfusion.  Obesity, Class III, BMI 40-49.9 (morbid obesity) (HCC) BMI listed as 44.95  OSA (obstructive sleep apnea) Not on CPAP  Hypokalemia Replace again today.  Essential hypertension Continue amlodipine  Anxiety and depression Continue BuSpar and doxepin  Hyperlipidemia Continue Lipitor        Subjective: Patient feeling much better after thoracentesis.  1 L  drawn off.  Able to come off oxygen today.  Admitted with acute respiratory failure and parapneumonic effusion.  Physical Exam: Vitals:   05/09/23 2306 05/10/23 0720 05/10/23 0817 05/10/23 1340  BP: (!) 143/76  118/72   Pulse: 88  86   Resp: 18  18   Temp: 98 F (36.7 C)  98.3 F (36.8 C)   TempSrc:      SpO2: 93% 95% 93% 97%  Weight:      Height:       Physical Exam HENT:     Head: Normocephalic.     Mouth/Throat:     Pharynx: No oropharyngeal exudate.  Eyes:     General: Lids are normal.     Conjunctiva/sclera: Conjunctivae normal.  Cardiovascular:     Rate and Rhythm: Normal rate and regular rhythm.     Heart sounds: Normal heart sounds, S1 normal and S2 normal.  Pulmonary:     Breath sounds: Examination of the left-lower field reveals decreased breath sounds and rhonchi. Decreased breath sounds and rhonchi present. No wheezing or rales.  Abdominal:     Palpations: Abdomen is soft.     Tenderness: There is no abdominal tenderness.  Musculoskeletal:     Right lower leg: Swelling present.     Left lower leg: Swelling present.  Skin:    General: Skin is warm.     Findings: No rash.  Neurological:     Mental Status: She is alert and oriented to person, place, and time.     Data Reviewed: White blood cell count 18.1, hemoglobin 7.7, platelet count 476,  creatinine 0.76 potassium 3.2 Pleural fluid showing an LDH of 743 red in color, 2008 120 nucleated cells with 46% neutrophils and 38% lymphs.  No organisms seen on culture so far   Disposition: Status is: Inpatient Remains inpatient appropriate because: With hemoglobin on the lower side and white count elevated will continue IV antibiotics today and reassess tomorrow.  Planned Discharge Destination: Home    Time spent: 27 minutes  Author: Alford Highland, MD 05/10/2023 2:12 PM  For on call review [email or web address].

## 2023-05-10 NOTE — Plan of Care (Signed)
  Problem: Education: Goal: Knowledge of disease or condition will improve Outcome: Progressing   Problem: Activity: Goal: Ability to tolerate increased activity will improve Outcome: Progressing   Problem: Respiratory: Goal: Ability to maintain a clear airway will improve Outcome: Progressing   Problem: Education: Goal: Knowledge of General Education information will improve Description: Including pain rating scale, medication(s)/side effects and non-pharmacologic comfort measures Outcome: Progressing   Problem: Clinical Measurements: Goal: Ability to maintain clinical measurements within normal limits will improve Outcome: Progressing   Problem: Activity: Goal: Risk for activity intolerance will decrease Outcome: Progressing   Problem: Nutrition: Goal: Adequate nutrition will be maintained Outcome: Progressing

## 2023-05-10 NOTE — Plan of Care (Signed)

## 2023-05-11 ENCOUNTER — Other Ambulatory Visit: Payer: Self-pay | Admitting: Internal Medicine

## 2023-05-11 DIAGNOSIS — J9601 Acute respiratory failure with hypoxia: Secondary | ICD-10-CM | POA: Diagnosis not present

## 2023-05-11 DIAGNOSIS — J189 Pneumonia, unspecified organism: Secondary | ICD-10-CM | POA: Diagnosis not present

## 2023-05-11 DIAGNOSIS — D638 Anemia in other chronic diseases classified elsewhere: Secondary | ICD-10-CM | POA: Diagnosis not present

## 2023-05-11 DIAGNOSIS — E785 Hyperlipidemia, unspecified: Secondary | ICD-10-CM

## 2023-05-11 LAB — CBC WITH DIFFERENTIAL/PLATELET
Abs Immature Granulocytes: 0.18 10*3/uL — ABNORMAL HIGH (ref 0.00–0.07)
Basophils Absolute: 0.1 10*3/uL (ref 0.0–0.1)
Basophils Relative: 0 %
Eosinophils Absolute: 0.1 10*3/uL (ref 0.0–0.5)
Eosinophils Relative: 1 %
HCT: 26.2 % — ABNORMAL LOW (ref 36.0–46.0)
Hemoglobin: 8.4 g/dL — ABNORMAL LOW (ref 12.0–15.0)
Immature Granulocytes: 1 %
Lymphocytes Relative: 30 %
Lymphs Abs: 4.5 10*3/uL — ABNORMAL HIGH (ref 0.7–4.0)
MCH: 23.9 pg — ABNORMAL LOW (ref 26.0–34.0)
MCHC: 32.1 g/dL (ref 30.0–36.0)
MCV: 74.4 fL — ABNORMAL LOW (ref 80.0–100.0)
Monocytes Absolute: 0.9 10*3/uL (ref 0.1–1.0)
Monocytes Relative: 6 %
Neutro Abs: 9.4 10*3/uL — ABNORMAL HIGH (ref 1.7–7.7)
Neutrophils Relative %: 62 %
Platelets: 547 10*3/uL — ABNORMAL HIGH (ref 150–400)
RBC: 3.52 MIL/uL — ABNORMAL LOW (ref 3.87–5.11)
RDW: 17.2 % — ABNORMAL HIGH (ref 11.5–15.5)
WBC: 15.2 10*3/uL — ABNORMAL HIGH (ref 4.0–10.5)
nRBC: 1.1 % — ABNORMAL HIGH (ref 0.0–0.2)

## 2023-05-11 LAB — MAGNESIUM: Magnesium: 2.1 mg/dL (ref 1.7–2.4)

## 2023-05-11 LAB — POTASSIUM: Potassium: 3.6 mmol/L (ref 3.5–5.1)

## 2023-05-11 MED ORDER — AMOXICILLIN-POT CLAVULANATE 875-125 MG PO TABS
1.0000 | ORAL_TABLET | Freq: Two times a day (BID) | ORAL | Status: DC
  Administered 2023-05-11: 1 via ORAL
  Filled 2023-05-11: qty 1

## 2023-05-11 MED ORDER — AMOXICILLIN-POT CLAVULANATE 875-125 MG PO TABS: 1.0000 | ORAL_TABLET | Freq: Two times a day (BID) | ORAL | 0 refills | Status: AC

## 2023-05-11 MED ORDER — ACETAMINOPHEN 325 MG PO TABS: 650.0000 mg | ORAL_TABLET | Freq: Four times a day (QID) | ORAL | Status: DC | PRN

## 2023-05-11 NOTE — Discharge Summary (Signed)
Physician Discharge Summary   Patient: Courtney Carrillo MRN: [id number] DOB: 1966/07/07  Admit date:     05/08/2023  Discharge date: 05/11/23  Discharge Physician: Alford Highland   PCP: Lorre Munroe, NP   Recommendations at discharge:   Follow-up PCP 5 days Refer to pulmonology as outpatient  Discharge Diagnoses: Principal Problem:   Acute respiratory failure with hypoxia (HCC) Active Problems:   Parapneumonic effusion   Anemia of chronic disease   Obesity, Class III, BMI 40-49.9 (morbid obesity) (HCC)   OSA (obstructive sleep apnea)   Hyperlipidemia   Anxiety and depression   Essential hypertension   Hypokalemia   Hospital Course: 57 year old female with morbid obesity, hypertension, hyperlipidemia, sleep apnea, anxiety depression, chronic anemia presented with worsening cough and shortness of breath.  Patient had a fall 9 days ago and had a sharp pain and x-ray showed a rib fracture.  Patient woke up Monday with shortness of breath and wheezing and x-ray showed infiltrates and moderate pleural effusion patient was given Augmentin and Zithromax.  Patient not feeling any better.  EMS found the patient to be hypoxic with a pulse ox of 84%.  9/28.  Patient feeling better with just oxygen and IV antibiotics.  Thoracentesis performed and 1 L was removed. 9/29.  Patient feeling better.  Able to come off oxygen.  Still has slight cough. 9/30.  Patient wants to go home.  No growth after thoracentesis culture for 1 day.  Will give Augmentin for 2 Fredlund upon going home.  Assessment and Plan: * Acute respiratory failure with hypoxia (HCC) Pulse ox of 84% by EMS.  Able to come off oxygen 9/29.  Parapneumonic effusion CT scan showing large left pleural effusion.  Patient received Rocephin and Zithromax here.  Thoracentesis consistent with exudate with pleural LDH being very elevated and pleural protein to serum greater than 0.5.  Thoracentesis culture negative for 1 day.  Could have an  element of trauma and bleeding into the peritoneal space from rib fracture.  Will give 2 Egelhoff worth of Augmentin upon discharge.  Anemia of chronic disease Ferritin up at 479 and hemoglobin 8.4.  Follow-up as outpatient.  Obesity, Class III, BMI 40-49.9 (morbid obesity) (HCC) BMI listed as 44.95  OSA (obstructive sleep apnea) Not on CPAP  Hypokalemia Replaced  Essential hypertension Continue amlodipine  Anxiety and depression Continue Vraylar, BuSpar and doxepin  Hyperlipidemia Continue Lipitor         Consultants: Interventional radiology Procedures performed: Thoracentesis Disposition: Home Diet recommendation:  Cardiac diet DISCHARGE MEDICATION: Allergies as of 05/11/2023       Reactions   Lisinopril Cough   Losartan Cough   Other Other (See Comments)   Perfumes : Congestion        Medication List     STOP taking these medications    azithromycin 250 MG tablet Commonly known as: Zithromax Z-Pak   hydrochlorothiazide 25 MG tablet Commonly known as: HYDRODIURIL       TAKE these medications    acetaminophen 325 MG tablet Commonly known as: TYLENOL Take 2 tablets (650 mg total) by mouth every 6 (six) hours as needed for mild pain (or Fever >/= 101).   amLODipine 5 MG tablet Commonly known as: NORVASC TAKE 1 TABLET(5 MG) BY MOUTH DAILY   amoxicillin-clavulanate 875-125 MG tablet Commonly known as: AUGMENTIN Take 1 tablet by mouth every 12 (twelve) hours for 14 days. What changed: when to take this   atorvastatin 40 MG tablet Commonly known as: LIPITOR  TAKE 1 TABLET(40 MG) BY MOUTH EVERY MORNING   benztropine 1 MG tablet Commonly known as: COGENTIN Take 1 mg by mouth every morning.   busPIRone 10 MG tablet Commonly known as: BUSPAR Take 20 mg by mouth 2 (two) times daily.   doxepin 50 MG capsule Commonly known as: SINEQUAN Take 50 mg by mouth at bedtime.   hydrOXYzine 10 MG tablet Commonly known as: ATARAX Take 1 tablet (10 mg  total) by mouth 3 (three) times daily as needed.   ibuprofen 800 MG tablet Commonly known as: ADVIL Take 1 tablet (800 mg total) by mouth every 8 (eight) hours as needed.   lidocaine 5 % Commonly known as: Lidoderm Place 1 patch onto the skin every 12 (twelve) hours. Remove & Discard patch within 12 hours or as directed by MD   methocarbamol 500 MG tablet Commonly known as: ROBAXIN Take 1 tablet (500 mg total) by mouth every 8 (eight) hours as needed for muscle spasms.   Vraylar 6 MG Caps Generic drug: Cariprazine HCl Take 1 capsule by mouth daily.        Follow-up Information     Lorre Munroe, NP Follow up in 5 day(s).   Specialties: Internal Medicine, Emergency Medicine Contact information: [address] [place] [address] [phone]         Vida Rigger, MD Follow up in 2 week(s).   Specialty: Pulmonary Disease Contact information: [address] [place] [address] [phone]                Discharge Exam: Ceasar Mons Weights   05/08/23 1507  Weight: 107.9 kg   Physical Exam HENT:     Head: Normocephalic.     Mouth/Throat:     Pharynx: No oropharyngeal exudate.  Eyes:     General: Lids are normal.     Conjunctiva/sclera: Conjunctivae normal.  Cardiovascular:     Rate and Rhythm: Normal rate and regular rhythm.     Heart sounds: Normal heart sounds, S1 normal and S2 normal.  Pulmonary:     Breath sounds: Examination of the left-lower field reveals decreased breath sounds and rhonchi. Decreased breath sounds and rhonchi present. No wheezing or rales.  Abdominal:     Palpations: Abdomen is soft.     Tenderness: There is no abdominal tenderness.  Musculoskeletal:     Right lower leg: Swelling present.     Left lower leg: Swelling present.  Skin:    General: Skin is warm.     Findings: No rash.  Neurological:     Mental Status: She is alert and oriented to person, place, and time.      Condition at discharge: stable  The  results of significant diagnostics from this hospitalization (including imaging, microbiology, ancillary and laboratory) are listed below for reference.   Imaging Studies: US THORACENTESIS ASP PLEURAL SPACE W/IMG GUIDE  Result Date: 05/09/2023 INDICATION: 57 year old female with a recent left-sided rib fractures, pneumonia and large left-sided pleural effusion. EXAM: ULTRASOUND GUIDED LEFT THORACENTESIS MEDICATIONS: None. COMPLICATIONS: None immediate. PROCEDURE: An ultrasound guided thoracentesis was thoroughly discussed with the patient and questions answered. The benefits, risks, alternatives and complications were also discussed. The patient understands and wishes to proceed with the procedure. Written consent was obtained. Ultrasound was performed to localize and mark an adequate pocket of fluid in the left chest. The area was then prepped and draped in the normal sterile fashion. 1% Lidocaine was used for local anesthesia. Under ultrasound guidance a 6  Fr Safe-T-Centesis catheter was introduced. Thoracentesis was performed. The catheter was removed and a dressing applied. FINDINGS: A total of approximately 1000 mL of dark maroon bloody pleural fluid was removed. Samples were sent to the laboratory as requested by the clinical team. IMPRESSION: Successful ultrasound guided left thoracentesis yielding 1 L of bloody pleural fluid. Electronically Signed   By: Malachy Moan M.D.   On: 05/09/2023 16:01   DG Chest Port 1 View  Result Date: 05/09/2023 CLINICAL DATA:  Status post left-sided pleural effusion EXAM: PORTABLE CHEST 1 VIEW COMPARISON:  Prior chest x-ray obtained 05/08/2023 FINDINGS: No evidence of pneumothorax. Decreased volume of left pleural effusion. Cardiac and mediastinal contours are unchanged. Trace atelectasis in the right lung base. IMPRESSION: No evidence of pneumothorax following left-sided thoracentesis. Electronically Signed   By: Malachy Moan M.D.   On: 05/09/2023 16:00    DG Chest 2 View  Result Date: 05/08/2023 CLINICAL DATA:  Shortness of breath, low O2 sats. EXAM: CHEST - 2 VIEW COMPARISON:  05/04/2023 FINDINGS: Enlarging large left pleural effusion. Left lower lobe atelectasis or infiltrate. Right lung clear. Heart and mediastinal contours within normal limits. No acute bony abnormality. IMPRESSION: Enlarging large left pleural effusion with left lower lobe atelectasis or infiltrate. Electronically Signed   By: Charlett Nose M.D.   On: 05/08/2023 17:15   CT CHEST WO CONTRAST  Result Date: 05/08/2023 CLINICAL DATA:  Pneumonia, complication suspected, xray done. Low O2 sats. Shortness of breath. EXAM: CT CHEST WITHOUT CONTRAST TECHNIQUE: Multidetector CT imaging of the chest was performed following the standard protocol without IV contrast. RADIATION DOSE REDUCTION: This exam was performed according to the departmental dose-optimization program which includes automated exposure control, adjustment of the mA and/or kV according to patient size and/or use of iterative reconstruction technique. COMPARISON:  Chest x-ray today FINDINGS: Cardiovascular: Heart is normal size. Aorta is normal caliber. Mediastinum/Nodes: No mediastinal, hilar, or axillary adenopathy. Trachea and esophagus are unremarkable. Thyroid unremarkable. Lungs/Pleura: Large left pleural effusion. Compressive atelectasis or consolidation in the left lower lobe. No confluent opacity or effusion on the right. Upper Abdomen: No acute findings Musculoskeletal: Chest wall soft tissues are unremarkable. No acute bony abnormality. IMPRESSION: Large left pleural effusion. Left lower lobe consolidation or compressive atelectasis. Electronically Signed   By: Charlett Nose M.D.   On: 05/08/2023 17:15   DG Chest 2 View  Result Date: 05/04/2023 CLINICAL DATA:  Status post fall. EXAM: CHEST - 2 VIEW COMPARISON:  May 01, 2023 FINDINGS: The heart size and mediastinal contours are within normal limits. Moderate  severity atelectasis and/or infiltrate is seen within the left lung base. A small to moderate size left pleural effusion is also noted. No pneumothorax is identified. The visualized skeletal structures are unremarkable. IMPRESSION: 1. Moderate severity left basilar atelectasis and/or infiltrate. 2. Small to moderate size left pleural effusion. Electronically Signed   By: Aram Candela M.D.   On: 05/04/2023 20:10   DG Ribs Unilateral Left  Result Date: 05/01/2023 CLINICAL DATA:  Mechanical fall.  Left-sided rib pain. EXAM: LEFT RIBS - 2 VIEW COMPARISON:  None Available. FINDINGS: The heart size is normal. The visualized lung fields are clear. Left posterolateral fifth and sixth rib fractures are minimally displaced. No pneumothorax is present. Fusion of the lower lumbar spine is noted. IMPRESSION: Minimally displaced left posterolateral fifth and sixth rib fractures without pneumothorax. Electronically Signed   By: Marin Roberts M.D.   On: 05/01/2023 15:13    Microbiology: Results for orders placed or  performed during the hospital encounter of 05/08/23  Body fluid culture w Gram Stain     Status: None (Preliminary result)   Collection Time: 05/09/23  3:30 PM   Specimen: PATH Cytology Pleural fluid  Result Value Ref Range Status   Specimen Description   Final    PLEURAL Performed at [facility], [address], [place], [place] [address]    Special Requests   Final    NONE Performed at [facility], [address] [place], [place] [address]    Gram Stain   Final    FEW WBC PRESENT,BOTH PMN AND MONONUCLEAR NO ORGANISMS SEEN    Culture   Final    NO GROWTH 1 DAY Performed at [facility], [address], [place], [place] [address]    Report Status PENDING  Incomplete    Labs: CBC: Recent Labs  Lab 05/04/23 2327 05/08/23 1511 05/09/23 0452 05/10/23 0335 05/11/23 0601  WBC 13.7* 17.1* 15.0* 18.1* 15.2*  NEUTROABS 9.9* 12.4*  --   --  9.4*   HGB 9.5* 9.0* 7.8* 7.7* 8.4*  HCT 31.1* 27.6* 24.0* 24.1* 26.2*  MCV 76.6* 73.4* 72.9* 74.8* 74.4*  PLT 341 478* 425* 476* 547*   Basic Metabolic Panel: Recent Labs  Lab 05/04/23 2327 05/08/23 1511 05/09/23 0452 05/10/23 0335 05/11/23 0601  NA 140 137 142 139  --   K 3.2* 2.7* 4.2 3.2* 3.6  CL 102 92* 99 100  --   CO2 26 33* 32 30  --   GLUCOSE 135* 173* 149* 154*  --   BUN 15 7 7 10   --   CREATININE 0.86 0.80 0.67 0.76  --   CALCIUM 8.7* 9.0 9.1 8.8*  --   MG  --  2.3  --   --  2.1   Liver Function Tests: Recent Labs  Lab 05/08/23 1511  AST 42*  ALT 38  ALKPHOS 82  BILITOT 0.5  PROT 7.5  ALBUMIN 3.4*     Discharge time spent: greater than 30 minutes.  Signed: Alford Highland, MD [facility] 05/11/2023

## 2023-05-11 NOTE — Plan of Care (Signed)
  Problem: Education: Goal: Knowledge of disease or condition will improve Outcome: Progressing Goal: Knowledge of the prescribed therapeutic regimen will improve Outcome: Progressing Goal: Individualized Educational Video(s) Outcome: Progressing   Problem: Respiratory: Goal: Ability to maintain a clear airway will improve Outcome: Progressing   Problem: Health Behavior/Discharge Planning: Goal: Ability to manage health-related needs will improve Outcome: Progressing

## 2023-05-12 ENCOUNTER — Other Ambulatory Visit: Payer: Self-pay | Admitting: Internal Medicine

## 2023-05-12 DIAGNOSIS — E785 Hyperlipidemia, unspecified: Secondary | ICD-10-CM

## 2023-05-12 LAB — CYTOLOGY - NON PAP

## 2023-05-12 LAB — MYCOPLASMA PNEUMONIAE ANTIBODY, IGM: Mycoplasma pneumo IgM: <770 U/mL (ref 0–769)

## 2023-05-12 NOTE — Telephone Encounter (Signed)
Requested Prescriptions  Refused Prescriptions Disp Refills   atorvastatin (LIPITOR) 40 MG tablet [Pharmacy Med Name: ATORVASTATIN 40MG  TABLETS] 90 tablet 2    Sig: TAKE 1 TABLET(40 MG) BY MOUTH EVERY MORNING     Cardiovascular:  Antilipid - Statins Failed - 05/12/2023 11:03 AM      Failed - Lipid Panel in normal range within the last 12 months    Cholesterol  Date Value Ref Range Status  01/28/2023 158 <200 mg/dL Final   LDL Cholesterol (Calc)  Date Value Ref Range Status  01/28/2023 84 mg/dL (calc) Final    Comment:    Reference range: <100 . Desirable range <100 mg/dL for primary prevention;   <70 mg/dL for patients with CHD or diabetic patients  with > or = 2 CHD risk factors. Marland Kitchen LDL-C is now calculated using the Martin-Hopkins  calculation, which is a validated novel method providing  better accuracy than the Friedewald equation in the  estimation of LDL-C.  Horald Pollen et al. Lenox Ahr. 0981;191(47): 2061-2068  (http://education.[facility].com/faq/FAQ164)    HDL  Date Value Ref Range Status  01/28/2023 51 > OR = 50 mg/dL Final   Triglycerides  Date Value Ref Range Status  01/28/2023 124 <150 mg/dL Final         Passed - Patient is not pregnant      Passed - Valid encounter within last 12 months    Recent Outpatient Visits           1 week ago Rib pain on left side   [place] [facility] [place], Salvadore [place], NP   3 months ago Pure hypercholesterolemia   [place] [facility] [place], Salvadore [place], NP   10 months ago Encounter for general adult medical examination with abnormal findings   [place] [facility] [place], Salvadore [place], NP   1 year ago Mixed hyperlipidemia   [facility] [place], Salvadore [place], NP   2 years ago Medicare annual wellness visit, subsequent   [place] [facility] [place], Salvadore [place], NP       Future Appointments             In 3 days Baity, Salvadore [place], NP  [place] [facility], [facility]

## 2023-05-12 NOTE — Telephone Encounter (Signed)
Requested Prescriptions  Pending Prescriptions Disp Refills   atorvastatin (LIPITOR) 40 MG tablet [Pharmacy Med Name: ATORVASTATIN 40MG  TABLETS] 90 tablet 2    Sig: TAKE 1 TABLET(40 MG) BY MOUTH EVERY MORNING     Cardiovascular:  Antilipid - Statins Failed - 05/11/2023 11:53 AM      Failed - Lipid Panel in normal range within the last 12 months    Cholesterol  Date Value Ref Range Status  01/28/2023 158 <200 mg/dL Final   LDL Cholesterol (Calc)  Date Value Ref Range Status  01/28/2023 84 mg/dL (calc) Final    Comment:    Reference range: <100 . Desirable range <100 mg/dL for primary prevention;   <70 mg/dL for patients with CHD or diabetic patients  with > or = 2 CHD risk factors. Marland Kitchen LDL-C is now calculated using the Martin-Hopkins  calculation, which is a validated novel method providing  better accuracy than the Friedewald equation in the  estimation of LDL-C.  Horald Pollen et al. Lenox Ahr. 1610;960(45): 2061-2068  (http://education.[facility].com/faq/FAQ164)    HDL  Date Value Ref Range Status  01/28/2023 51 > OR = 50 mg/dL Final   Triglycerides  Date Value Ref Range Status  01/28/2023 124 <150 mg/dL Final         Passed - Patient is not pregnant      Passed - Valid encounter within last 12 months    Recent Outpatient Visits           1 week ago Rib pain on left side   [place] [facility] [place], Salvadore [place], NP   3 months ago Pure hypercholesterolemia   [facility] [place], Salvadore [place], NP   10 months ago Encounter for general adult medical examination with abnormal findings   [place] [facility] [place], Salvadore [place], NP   1 year ago Mixed hyperlipidemia   [place] [facility] [place], Salvadore [place], NP   2 years ago Medicare annual wellness visit, subsequent   [facility] [place], Salvadore [place], NP       Future Appointments             In 3 days Baity, Salvadore [place], NP  [facility], [facility]

## 2023-05-13 ENCOUNTER — Ambulatory Visit: Payer: Self-pay | Admitting: *Deleted

## 2023-05-13 LAB — BODY FLUID CULTURE W GRAM STAIN

## 2023-05-13 LAB — LEGIONELLA PNEUMOPHILA SEROGP 1 UR AG: L. pneumophila Serogp 1 Ur Ag: NEGATIVE

## 2023-05-13 NOTE — Telephone Encounter (Signed)
  Chief Complaint: Short of breath.   In hospital 9/27-9/30 with pneumonia.   They drained a liter of fluid from her lung. Symptoms: She is having shortness of breath with minimal exertion.   Pulse Ox 89-lower 90s%. Frequency: Started feeling short of breath yesterday so bought a pulse oximeter. Pertinent Negatives: Patient denies fever. Disposition: [x] ED /[] Urgent Care (no appt availability in office) / [] Appointment(In office/virtual)/ []  [facility]/ [] Home Care/ [] Refused Recommended Disposition /[] [facility]/ []  Follow-up with PCP Additional Notes: Due to her low O2 levels and shortness of breath I have referred her to the ED.   She is agreeable to going.  Her daughter is with her and going with her to the ED. Message sent to Nicki Reaper, NP.

## 2023-05-13 NOTE — Telephone Encounter (Signed)
Reason for Disposition  Oxygen level (e.g., pulse oximetry) 90 percent or lower    Just got out of the hospital with pneumonia.  Answer Assessment - Initial Assessment Questions 1. RESPIRATORY STATUS: "Describe your breathing?" (e.g., wheezing, shortness of breath, unable to speak, severe coughing)       I'm having shortness of breath with walking.  I'm gasping for air.      I was in the hospital because I fell and cracked my ribs and I had fluid in my lungs and pneumonia.  They had to draw fluid from my lung.   It was a Liter of blood and fluid.   There's still some left in there.   I'm on Amoxicillin.    My o2 is 23 but it goes down to 89% on pulse oximeter.     Checked while O2 while on phone 90%.     2. ONSET: "When did this breathing problem begin?"      Yesterday 3. PATTERN "Does the difficult breathing come and go, or has it been constant since it started?"      Worse when up walking around 4. SEVERITY: "How bad is your breathing?" (e.g., mild, moderate, severe)    - MILD: No SOB at rest, mild SOB with walking, speaks normally in sentences, can lie down, no retractions, pulse < 100.    - MODERATE: SOB at rest, SOB with minimal exertion and prefers to sit, cannot lie down flat, speaks in phrases, mild retractions, audible wheezing, pulse 100-120.    - SEVERE: Very SOB at rest, speaks in single words, struggling to breathe, sitting hunched forward, retractions, pulse > 120      Moderate 5. RECURRENT SYMPTOM: "Have you had difficulty breathing before?" If Yes, ask: "When was the last time?" and "What happened that time?"      I was just in the hospital for pneumonia.   6. CARDIAC HISTORY: "Do you have any history of heart disease?" (e.g., heart attack, angina, bypass surgery, angioplasty)      Not asked 7. LUNG HISTORY: "Do you have any history of lung disease?"  (e.g., pulmonary embolus, asthma, emphysema)     No 8. CAUSE: "What do you think is causing the breathing problem?"      I  just got out of the hospital with pneumonia.    9. OTHER SYMPTOMS: "Do you have any other symptoms? (e.g., dizziness, runny nose, cough, chest pain, fever)     Short of breath with minimal exertion 10. O2 SATURATION MONITOR:  "Do you use an oxygen saturation monitor (pulse oximeter) at home?" If Yes, ask: "What is your reading (oxygen level) today?" "What is your usual oxygen saturation reading?" (e.g., 95%)       My O2 level is going down to 89%.   It's fluctuating around the low 90s mostly. 11. PREGNANCY: "Is there any chance you are pregnant?" "When was your last menstrual period?"       N/A due to age 57. TRAVEL: "Have you traveled out of the country in the last month?" (e.g., travel history, exposures)       N/A  Protocols used: Breathing Difficulty-A-AH

## 2023-05-13 NOTE — Telephone Encounter (Signed)
Agree with advice given

## 2023-05-15 ENCOUNTER — Ambulatory Visit: Payer: 59 | Admitting: Internal Medicine

## 2023-05-15 ENCOUNTER — Ambulatory Visit
Admission: RE | Admit: 2023-05-15 | Discharge: 2023-05-15 | Disposition: A | Discharge status: Home / Self Care | Payer: 59 | Attending: Internal Medicine | Admitting: Internal Medicine

## 2023-05-15 ENCOUNTER — Encounter: Payer: Self-pay | Admitting: Internal Medicine

## 2023-05-15 ENCOUNTER — Ambulatory Visit
Admission: RE | Admit: 2023-05-15 | Discharge: 2023-05-15 | Disposition: A | Discharge status: Home / Self Care | Payer: 59 | Source: Ambulatory Visit | Attending: Internal Medicine | Admitting: Internal Medicine

## 2023-05-15 VITALS — BP 138/86 | HR 96 | Temp 96.6°F | Wt 240.0 lb

## 2023-05-15 DIAGNOSIS — S2242XA Multiple fractures of ribs, left side, initial encounter for closed fracture: Secondary | ICD-10-CM

## 2023-05-15 DIAGNOSIS — J90 Pleural effusion, not elsewhere classified: Secondary | ICD-10-CM | POA: Insufficient documentation

## 2023-05-15 DIAGNOSIS — J9601 Acute respiratory failure with hypoxia: Secondary | ICD-10-CM

## 2023-05-15 DIAGNOSIS — R0602 Shortness of breath: Secondary | ICD-10-CM | POA: Diagnosis not present

## 2023-05-15 DIAGNOSIS — R918 Other nonspecific abnormal finding of lung field: Secondary | ICD-10-CM | POA: Diagnosis not present

## 2023-05-15 DIAGNOSIS — J189 Pneumonia, unspecified organism: Secondary | ICD-10-CM

## 2023-05-15 LAB — COMP PANEL: LEUKEMIA/LYMPHOMA

## 2023-05-15 NOTE — Progress Notes (Signed)
Subjective:    Patient ID: Courtney Carrillo, female    DOB: 08-Dec-1965, 57 y.o.   MRN: [id number]  HPI  Patient presents to clinic today for TCM hospital follow-up.  She presented to the ER 9/27 with complaint of cough, shortness of breath and left-sided rib pain.  She had sustained a fall 1 week prior, was seen by me and treated for left-sided rib fractures.  Chest x-ray showed left side infiltrate as well as a pleural effusion.  She was started on augmentin and azithromycin but was not feeling any better.  She was found to be hypoxic.  She was started on IV antibiotics and thoracentesis was performed removing 1 L of fluid from the left lung.  Her oxygen was eventually weaned.  She was discharged on 9/30 with augmentin x 14 days.  She was advised to follow-up with her PCP and pulmonology as an outpatient.  Since that time, she reports left sided rib pain, slight cough, and shortness of breath. The cough is nonproductive. The shortness of breath is worse with exertion. She denies chest pain. She has continued to take the augmentin and has an been using her incentive spirometer. She has not schedule a follow up with pulmonology.  Review of Systems     Past Medical History:  Diagnosis Date   Anemia    2015   Anxiety    Depression    Headache    H/O   Hyperlipidemia    Hypertension    Sleep apnea    CPAP    Current Outpatient Medications  Medication Sig Dispense Refill   acetaminophen (TYLENOL) 325 MG tablet Take 2 tablets (650 mg total) by mouth every 6 (six) hours as needed for mild pain (or Fever >/= 101).     amLODipine (NORVASC) 5 MG tablet TAKE 1 TABLET(5 MG) BY MOUTH DAILY 90 tablet 1   amoxicillin-clavulanate (AUGMENTIN) 875-125 MG tablet Take 1 tablet by mouth every 12 (twelve) hours for 14 days. 28 tablet 0   atorvastatin (LIPITOR) 40 MG tablet TAKE 1 TABLET(40 MG) BY MOUTH EVERY MORNING 90 tablet 2   benztropine (COGENTIN) 1 MG tablet Take 1 mg by mouth every morning.      busPIRone (BUSPAR) 10 MG tablet Take 20 mg by mouth 2 (two) times daily.     doxepin (SINEQUAN) 50 MG capsule Take 50 mg by mouth at bedtime.     hydrOXYzine (ATARAX) 10 MG tablet Take 1 tablet (10 mg total) by mouth 3 (three) times daily as needed. 30 tablet 0   ibuprofen (ADVIL) 800 MG tablet Take 1 tablet (800 mg total) by mouth every 8 (eight) hours as needed. 30 tablet 2   lidocaine (LIDODERM) 5 % Place 1 patch onto the skin every 12 (twelve) hours. Remove & Discard patch within 12 hours or as directed by MD 10 patch 0   methocarbamol (ROBAXIN) 500 MG tablet Take 1 tablet (500 mg total) by mouth every 8 (eight) hours as needed for muscle spasms. 30 tablet 0   VRAYLAR 6 MG CAPS Take 1 capsule by mouth daily.     No current facility-administered medications for this visit.    Allergies  Allergen Reactions   Lisinopril Cough   Losartan Cough   Other Other (See Comments)    Perfumes : Congestion    Family History  Problem Relation Age of Onset   Diabetes Mother    Breast cancer Sister     Social History   Socioeconomic History  Marital status: Single    Spouse name: Not on file   Number of children: Not on file   Years of education: Not on file   Highest education level: Not on file  Occupational History   Not on file  Tobacco Use   Smoking status: Former    Current packs/day: 0.50    Average packs/day: 0.5 packs/day for 35.1 years (17.5 ttl pk-yrs)    Types: Cigarettes    Start date: 09/12/2021   Smokeless tobacco: Never  Vaping Use   Vaping status: Never Used  Substance and Sexual Activity   Alcohol use: No   Drug use: Yes    Frequency: 7.0 times per week    Types: Marijuana   Sexual activity: Not on file  Other Topics Concern   Not on file  Social History Narrative   Not on file   Social Determinants of Health   Financial Resource Strain: Low Risk  (12/25/2022)   Overall Financial Resource Strain (CARDIA)    Difficulty of Paying Living Expenses: Not hard  at all  Food Insecurity: No Food Insecurity (05/08/2023)   Hunger Vital Sign    Worried About Running Out of Food in the Last Year: Never true    Ran Out of Food in the Last Year: Never true  Transportation Needs: No Transportation Needs (05/08/2023)   PRAPARE - Administrator, Civil Service (Medical): No    Lack of Transportation (Non-Medical): No  Physical Activity: Inactive (12/25/2022)   Exercise Vital Sign    Days of Exercise per Week: 0 days    Minutes of Exercise per Session: 0 min  Stress: No Stress Concern Present (12/25/2022)   [employer] of Occupational Health - Occupational Stress Questionnaire    Feeling of Stress : Not at all  Social Connections: Unknown (12/25/2022)   Social Connection and Isolation Panel [NHANES]    Frequency of Communication with Friends and Family: Once a week    Frequency of Social Gatherings with Friends and Family: Not on file    Attends Religious Services: More than 4 times per year    Active Member of [employer] or Organizations: No    Attends Banker Meetings: Never    Marital Status: Never married  Intimate Partner Violence: Not At Risk (05/08/2023)   Humiliation, Afraid, Rape, and Kick questionnaire    Fear of Current or Ex-Partner: No    Emotionally Abused: No    Physically Abused: No    Sexually Abused: No     Constitutional: Denies fever, malaise, fatigue, headache or abrupt weight changes.  HEENT: Denies eye pain, eye redness, ear pain, ringing in the ears, wax buildup, runny nose, nasal congestion, bloody nose, or sore throat. Respiratory: Patient reports cough and shortness of breath.  Denies difficulty breathing, or sputum production.   Cardiovascular: Denies chest pain, chest tightness, palpitations or swelling in the hands or feet.  Gastrointestinal: Denies abdominal pain, bloating, constipation, diarrhea or blood in the stool.  GU: Denies urgency, frequency, pain with urination, burning sensation, blood in  urine, odor or discharge. Musculoskeletal: Patient reports chronic back pain, left-sided rib pain.  Denies decrease in range of motion, difficulty with gait, or joint swelling.  Skin: Denies redness, rashes, lesions or ulcercations.  Neurological: Denies dizziness, difficulty with memory, difficulty with speech or problems with balance and coordination.  Psych: Patient has a history of anxiety and depression.  Denies SI/HI.  No other specific complaints in a complete review of systems (except  as listed in HPI above).  Objective:   Physical Exam  BP 138/86   Pulse 96   Temp (!) 96.6 F (35.9 C) (Temporal)   Wt 240 lb (108.9 kg)   SpO2 97%   BMI 45.35 kg/m   Wt Readings from Last 3 Encounters:  05/08/23 237 lb 14 oz (107.9 kg)  05/04/23 238 lb (108 kg)  05/01/23 238 lb 12.8 oz (108.3 kg)    General: Appears her stated age, obese, in NAD. Cardiovascular: Normal rate and rhythm. S1,S2 noted.  No murmur, rubs or gallops noted. No JVD or BLE edema. Pulmonary/Chest: Normal effort and diminished breath sounds. No respiratory distress. No wheezes, rales or ronchi noted.  Musculoskeletal: Left lateral ribs tender with palpation.  No difficulty with gait.  Neurological: Alert and oriented.    BMET    Component Value Date/Time   NA 139 05/10/2023 0335   K 3.6 05/11/2023 0601   CL 100 05/10/2023 0335   CO2 30 05/10/2023 0335   GLUCOSE 154 (H) 05/10/2023 0335   BUN 10 05/10/2023 0335   CREATININE 0.76 05/10/2023 0335   CREATININE 0.79 01/28/2023 0000   CALCIUM 8.8 (L) 05/10/2023 0335   GFRNONAA >60 05/10/2023 0335   GFRNONAA 74 08/17/2020 0814   GFRAA 86 08/17/2020 0814    Lipid Panel     Component Value Date/Time   CHOL 158 01/28/2023 0000   TRIG 124 01/28/2023 0000   HDL 51 01/28/2023 0000   CHOLHDL 3.1 01/28/2023 0000   LDLCALC 84 01/28/2023 0000    CBC    Component Value Date/Time   WBC 15.2 (H) 05/11/2023 0601   RBC 3.52 (L) 05/11/2023 0601   HGB 8.4 (L)  05/11/2023 0601   HCT 26.2 (L) 05/11/2023 0601   PLT 547 (H) 05/11/2023 0601   MCV 74.4 (L) 05/11/2023 0601   MCH 23.9 (L) 05/11/2023 0601   MCHC 32.1 05/11/2023 0601   RDW 17.2 (H) 05/11/2023 0601   LYMPHSABS 4.5 (H) 05/11/2023 0601   MONOABS 0.9 05/11/2023 0601   EOSABS 0.1 05/11/2023 0601   BASOSABS 0.1 05/11/2023 0601    Hgb A1C Lab Results  Component Value Date   HGBA1C 6.3 (H) 01/28/2023            Assessment & Plan:   TCM hospital follow-up for acute hypoxic respiratory failure, pneumonia secondary to rib fracture, pleural effusion:  Hospital notes, labs and imaging reviewed Advised her to continue her Augmentin until course completed Encourage deep breathing, splinting and use of incentive spirometer Encouraged rest, advised her she may need to take some additional days off work Advised her to follow-up with pulmonology, she has not yet scheduled this appointment Will repeat chest x-ray today to make sure that the pleural effusion has not built back up  RTC in 2 months for annual exam Nicki Reaper, NP

## 2023-05-15 NOTE — Patient Instructions (Signed)
Rib Fracture    A rib fracture is a break or crack in one of the bones of the ribs. The ribs are long, curved bones that wrap around your chest and attach to your spine and your breastbone. The ribs protect your heart, lungs, and other organs in the chest.  A broken or cracked rib is often painful but is not usually serious. Most rib fractures heal on their own over time. However, rib fractures can be more serious if multiple ribs are broken or if broken ribs move out of place and push against other structures or organs.  What are the causes?  This condition is caused by:  Repetitive movements with high force, such as pitching a baseball or having very bad coughing spells.  A direct hit the chest, such as a sports injury, a car crash, or a fall.  Cancer that has spread to the bones, which can weaken bones and cause them to break.  What are the signs or symptoms?  Symptoms of this condition include:  Pain when you breathe in or cough.  Pain when someone presses on the injured area.  Feeling short of breath.  How is this diagnosed?  This condition is diagnosed with a physical exam and medical history. You may also have imaging tests, such as:  Chest X-ray.  CT scan.  MRI.  Bone scan.  Chest ultrasound.  How is this treated?  Treatment for this condition depends on the severity of the fracture. Most rib fractures usually heal on their own in 1-3 months. Healing may take longer if you have a cough or if you do activities that make the injury worse. While you heal, you may be given medicines to control the pain. You will also be taught deep breathing exercises.  Severe injuries may require hospitalization or surgery.  Follow these instructions at home:  Managing pain, stiffness, and swelling  If directed, put ice on the injured area. To do this:  Put ice in a plastic bag.  Place a towel between your skin and the bag.  Leave the ice on for 20 minutes, 2-3 times a day.  Remove the ice if your skin turns bright red. This is  very important. If you cannot feel pain, heat, or cold, you have a greater risk of damage to the area.  Take over-the-counter and prescription medicines only as told by your health care provider.  Activity  Avoid doing activities or movements that cause pain. Be careful during activities and avoid bumping the injured rib.  Slowly increase your activity as told by your health care provider.  General instructions  Do deep breathing exercises as told by your health care provider. This helps prevent pneumonia, which is a common complication of a broken rib. Your health care provider may instruct you to:  Take deep breaths several times a day.  Try to cough several times a day, holding a pillow against the injured area.  Use a device called incentive spirometer to practice deep breathing several times a day.  Drink enough fluid to keep your urine pale yellow.  Do not wear a rib belt or binder. These restrict breathing, which can lead to pneumonia.  Keep all follow-up visits. This is important.  Contact a health care provider if:  You have a fever.  Get help right away if:  You have difficulty breathing or you are short of breath.  You develop a cough that does not stop, or you cough up thick or bloody  sputum.  You have nausea, vomiting, or pain in your abdomen.  Your pain gets worse and medicine does not help.  These symptoms may represent a serious problem that is an emergency. Do not wait to see if the symptoms will go away. Get medical help right away. Call your local emergency services (911 in the [place] Do not drive yourself to the hospital.  Summary  A rib fracture is a break or crack in one of the bones of the ribs.  A broken or cracked rib is often painful but is not usually serious.  Most rib fractures heal on their own over time.  Treatment for this condition depends on the severity of the fracture.  Avoid doing any activities or movements that cause pain.  This information is not intended to replace advice  given to you by your health care provider. Make sure you discuss any questions you have with your health care provider.  Document Revised: 11/18/2019 Document Reviewed: 11/18/2019  Elsevier Patient Education  2024 [employer].

## 2023-05-18 ENCOUNTER — Telehealth: Payer: Self-pay

## 2023-05-18 NOTE — Telephone Encounter (Signed)
Copied from CRM [id number]. Topic: General - Inquiry >> May 18, 2023  1:51 PM Marlow Baars wrote: Reason for CRM: The patient called in requesting a work note for today and she states she forgot to mention it at her appt on Friday. She states she needs rest. She states her ribs were cracked a few [place] ago and she hasn't quite recovered from that. Please assist patient further

## 2023-05-18 NOTE — Telephone Encounter (Signed)
Ok for work note? 

## 2023-05-18 NOTE — Telephone Encounter (Signed)
Pt would like a work note until Thursday.     Thanks,   -Vernona Rieger

## 2023-05-19 ENCOUNTER — Other Ambulatory Visit: Payer: Self-pay | Admitting: Internal Medicine

## 2023-05-19 DIAGNOSIS — I10 Essential (primary) hypertension: Secondary | ICD-10-CM

## 2023-05-20 NOTE — Telephone Encounter (Signed)
Requested Prescriptions  Pending Prescriptions Disp Refills   amLODipine (NORVASC) 5 MG tablet [Pharmacy Med Name: AMLODIPINE BESYLATE 5MG  TABLETS] 90 tablet 0    Sig: TAKE 1 TABLET(5 MG) BY MOUTH DAILY     Cardiovascular: Calcium Channel Blockers 2 Passed - 05/19/2023  7:47 PM      Passed - Last BP in normal range    BP Readings from Last 1 Encounters:  05/15/23 138/86         Passed - Last Heart Rate in normal range    Pulse Readings from Last 1 Encounters:  05/15/23 96         Passed - Valid encounter within last 6 months    Recent Outpatient Visits           5 days ago Pleural effusion on left   [place] [facility] [place], [place], NP   2 Ricke ago Rib pain on left side   [place] [facility] [place], [place], NP   3 months ago Pure hypercholesterolemia   [place] [facility] [place], Salv[place], NP   10 months ago Encounter for general adult medical examination with abnormal findings   [place] [facility] [place], Salv[place]ore [place], NP   1 year ago Mixed hyperlipidemia   [place] [facility] [place], [place], [place]

## 2023-05-20 NOTE — Telephone Encounter (Signed)
Letter sent to pt's My Chart   Thanks,   -Vernona Rieger

## 2023-05-25 ENCOUNTER — Encounter: Payer: Self-pay | Admitting: Internal Medicine

## 2023-05-28 ENCOUNTER — Encounter: Payer: Self-pay | Admitting: Internal Medicine

## 2023-05-28 ENCOUNTER — Telehealth (INDEPENDENT_AMBULATORY_CARE_PROVIDER_SITE_OTHER): Payer: 59 | Admitting: Internal Medicine

## 2023-05-28 DIAGNOSIS — Z0289 Encounter for other administrative examinations: Secondary | ICD-10-CM

## 2023-05-28 DIAGNOSIS — S2242XG Multiple fractures of ribs, left side, subsequent encounter for fracture with delayed healing: Secondary | ICD-10-CM

## 2023-05-28 DIAGNOSIS — J90 Pleural effusion, not elsewhere classified: Secondary | ICD-10-CM

## 2023-05-28 MED ORDER — ACETAMINOPHEN-CODEINE 300-30 MG PO TABS: 1.0000 | ORAL_TABLET | Freq: Three times a day (TID) | ORAL | 0 refills | Status: DC | PRN

## 2023-05-28 NOTE — Patient Instructions (Signed)
Rib Fracture    A rib fracture is a break or crack in one of the bones of the ribs. The ribs are long, curved bones that wrap around your chest and attach to your spine and your breastbone. The ribs protect your heart, lungs, and other organs in the chest.  A broken or cracked rib is often painful but is not usually serious. Most rib fractures heal on their own over time. However, rib fractures can be more serious if multiple ribs are broken or if broken ribs move out of place and push against other structures or organs.  What are the causes?  This condition is caused by:  Repetitive movements with high force, such as pitching a baseball or having very bad coughing spells.  A direct hit the chest, such as a sports injury, a car crash, or a fall.  Cancer that has spread to the bones, which can weaken bones and cause them to break.  What are the signs or symptoms?  Symptoms of this condition include:  Pain when you breathe in or cough.  Pain when someone presses on the injured area.  Feeling short of breath.  How is this diagnosed?  This condition is diagnosed with a physical exam and medical history. You may also have imaging tests, such as:  Chest X-ray.  CT scan.  MRI.  Bone scan.  Chest ultrasound.  How is this treated?  Treatment for this condition depends on the severity of the fracture. Most rib fractures usually heal on their own in 1-3 months. Healing may take longer if you have a cough or if you do activities that make the injury worse. While you heal, you may be given medicines to control the pain. You will also be taught deep breathing exercises.  Severe injuries may require hospitalization or surgery.  Follow these instructions at home:  Managing pain, stiffness, and swelling  If directed, put ice on the injured area. To do this:  Put ice in a plastic bag.  Place a towel between your skin and the bag.  Leave the ice on for 20 minutes, 2-3 times a day.  Remove the ice if your skin turns bright red. This is  very important. If you cannot feel pain, heat, or cold, you have a greater risk of damage to the area.  Take over-the-counter and prescription medicines only as told by your health care provider.  Activity  Avoid doing activities or movements that cause pain. Be careful during activities and avoid bumping the injured rib.  Slowly increase your activity as told by your health care provider.  General instructions  Do deep breathing exercises as told by your health care provider. This helps prevent pneumonia, which is a common complication of a broken rib. Your health care provider may instruct you to:  Take deep breaths several times a day.  Try to cough several times a day, holding a pillow against the injured area.  Use a device called incentive spirometer to practice deep breathing several times a day.  Drink enough fluid to keep your urine pale yellow.  Do not wear a rib belt or binder. These restrict breathing, which can lead to pneumonia.  Keep all follow-up visits. This is important.  Contact a health care provider if:  You have a fever.  Get help right away if:  You have difficulty breathing or you are short of breath.  You develop a cough that does not stop, or you cough up thick or bloody  sputum.  You have nausea, vomiting, or pain in your abdomen.  Your pain gets worse and medicine does not help.  These symptoms may represent a serious problem that is an emergency. Do not wait to see if the symptoms will go away. Get medical help right away. Call your local emergency services (911 in the [place] Do not drive yourself to the hospital.  Summary  A rib fracture is a break or crack in one of the bones of the ribs.  A broken or cracked rib is often painful but is not usually serious.  Most rib fractures heal on their own over time.  Treatment for this condition depends on the severity of the fracture.  Avoid doing any activities or movements that cause pain.  This information is not intended to replace advice  given to you by your health care provider. Make sure you discuss any questions you have with your health care provider.  Document Revised: 11/18/2019 Document Reviewed: 11/18/2019  Elsevier Patient Education  2024 [employer].

## 2023-05-28 NOTE — Progress Notes (Signed)
Virtual Visit via Video Note  I connected with Courtney Carrillo on 05/28/23 at  4:00 PM EDT by a video enabled telemedicine application and verified that I am speaking with the correct person using two identifiers.  Location: Patient: Work Provider: Office  Persons participating in this video call: Nicki Reaper, NP and Fenton Malling   I discussed the limitations of evaluation and management by telemedicine and the availability of in person appointments. The patient expressed understanding and agreed to proceed.  History of Present Illness:  Patient wanting to follow-up rib fractures.  She sustained a fall on 9/18 resulting in left-sided rib pain.  She was seen 9/20 for the same, x-ray at that time confirmed minimally displaced left fifth and sixth rib fractures without pneumothorax.  She was treated with pain medication and muscle relaxers.  She was advised to splint with movement or coughing.  She was encouraged to take deep breaths.  She subsequently went to the ER 9/23 for the same.  She had developed a small pleural effusion noted on chest x-ray at that time.  There was also concern for pneumonia.  She was discharged with prescription for augmentin, azithromycin and lidoderm patches.  She presented back to the ER 9/27 with complaint of increased left rib pain and shortness of breath.  She was admitted for further evaluation.  Chest x-ray showed a increasing large left pleural effusion which was confirmed by CT.  She underwent thoracentesis and drew a liter of fluid off.  She was discharged on 9/30 with 2 Rodrigue of augmentin.  She was seen in follow-up 10/4 by me.  She was advised to continue Augmentin and chest x-ray showed decrease in the left pleural effusion.  She was given a work note to go back on 10/10.  She reports she has been trying to work but is still having a lot of left-sided rib pain, shortness of breath and fatigue.  She would like FMLA form completions.   Past Medical History:   Diagnosis Date   Anemia    2015   Anxiety    Depression    Headache    H/O   Hyperlipidemia    Hypertension    Sleep apnea    CPAP    Current Outpatient Medications  Medication Sig Dispense Refill   acetaminophen (TYLENOL) 325 MG tablet Take 2 tablets (650 mg total) by mouth every 6 (six) hours as needed for mild pain (or Fever >/= 101).     amLODipine (NORVASC) 5 MG tablet TAKE 1 TABLET(5 MG) BY MOUTH DAILY 90 tablet 0   atorvastatin (LIPITOR) 40 MG tablet TAKE 1 TABLET(40 MG) BY MOUTH EVERY MORNING 90 tablet 2   benztropine (COGENTIN) 1 MG tablet Take 1 mg by mouth every morning.     busPIRone (BUSPAR) 10 MG tablet Take 20 mg by mouth 2 (two) times daily.     doxepin (SINEQUAN) 50 MG capsule Take 50 mg by mouth at bedtime.     hydrOXYzine (ATARAX) 10 MG tablet Take 1 tablet (10 mg total) by mouth 3 (three) times daily as needed. 30 tablet 0   ibuprofen (ADVIL) 800 MG tablet Take 1 tablet (800 mg total) by mouth every 8 (eight) hours as needed. 30 tablet 2   lidocaine (LIDODERM) 5 % Place 1 patch onto the skin every 12 (twelve) hours. Remove & Discard patch within 12 hours or as directed by MD 10 patch 0   methocarbamol (ROBAXIN) 500 MG tablet Take 1 tablet (500 mg  total) by mouth every 8 (eight) hours as needed for muscle spasms. 30 tablet 0   VRAYLAR 6 MG CAPS Take 1 capsule by mouth daily.     No current facility-administered medications for this visit.    Allergies  Allergen Reactions   Lisinopril Cough   Losartan Cough   Other Other (See Comments)    Perfumes : Congestion    Family History  Problem Relation Age of Onset   Diabetes Mother    Breast cancer Sister     Social History   Socioeconomic History   Marital status: Single    Spouse name: Not on file   Number of children: Not on file   Years of education: Not on file   Highest education level: Not on file  Occupational History   Not on file  Tobacco Use   Smoking status: Former    Current  packs/day: 0.50    Average packs/day: 0.5 packs/day for 35.1 years (17.6 ttl pk-yrs)    Types: Cigarettes    Start date: 09/12/2021   Smokeless tobacco: Never  Vaping Use   Vaping status: Never Used  Substance and Sexual Activity   Alcohol use: No   Drug use: Yes    Frequency: 7.0 times per week    Types: Marijuana   Sexual activity: Not on file  Other Topics Concern   Not on file  Social History Narrative   Not on file   Social Determinants of Health   Financial Resource Strain: Low Risk  (12/25/2022)   Overall Financial Resource Strain (CARDIA)    Difficulty of Paying Living Expenses: Not hard at all  Food Insecurity: No Food Insecurity (05/08/2023)   Hunger Vital Sign    Worried About Running Out of Food in the Last Year: Never true    Ran Out of Food in the Last Year: Never true  Transportation Needs: No Transportation Needs (05/08/2023)   PRAPARE - Administrator, Civil Service (Medical): No    Lack of Transportation (Non-Medical): No  Physical Activity: Inactive (12/25/2022)   Exercise Vital Sign    Days of Exercise per Week: 0 days    Minutes of Exercise per Session: 0 min  Stress: No Stress Concern Present (12/25/2022)   [employer] of Occupational Health - Occupational Stress Questionnaire    Feeling of Stress : Not at all  Social Connections: Unknown (12/25/2022)   Social Connection and Isolation Panel [NHANES]    Frequency of Communication with Friends and Family: Once a week    Frequency of Social Gatherings with Friends and Family: Not on file    Attends Religious Services: More than 4 times per year    Active Member of [employer] or Organizations: No    Attends Banker Meetings: Never    Marital Status: Never married  Intimate Partner Violence: Not At Risk (05/08/2023)   Humiliation, Afraid, Rape, and Kick questionnaire    Fear of Current or Ex-Partner: No    Emotionally Abused: No    Physically Abused: No    Sexually Abused: No      Constitutional: Patient reports fatigue.  Denies fever, malaise, headache or abrupt weight changes.  HEENT: Denies eye pain, eye redness, ear pain, ringing in the ears, wax buildup, runny nose, nasal congestion, bloody nose, or sore throat. Respiratory: Patient reports shortness of breath.  Denies difficulty breathing, cough or sputum production.   Cardiovascular: Denies chest pain, chest tightness, palpitations or swelling in the hands or feet.  Gastrointestinal: Denies abdominal pain, bloating, constipation, diarrhea or blood in the stool.  GU: Denies urgency, frequency, pain with urination, burning sensation, blood in urine, odor or discharge. Musculoskeletal: Patient reports left-sided rib pain.  Denies decrease in range of motion, difficulty with gait, muscle pain or joint swelling.  Skin: Denies redness, rashes, lesions or ulcercations.  Neurological: Denies dizziness, difficulty with memory, difficulty with speech or problems with balance and coordination.  Psych: Denies anxiety, depression, SI/HI.  No other specific complaints in a complete review of systems (except as listed in HPI above).   Observations/Objective:  There were no vitals taken for this visit. Wt Readings from Last 3 Encounters:  05/15/23 240 lb (108.9 kg)  05/08/23 237 lb 14 oz (107.9 kg)  05/04/23 238 lb (108 kg)    General: Appears her stated age, obese, in NAD. Pulmonary/Chest: Slightly increased effort. No respiratory distress.   Neurological: Alert and oriented.   BMET    Component Value Date/Time   NA 139 05/10/2023 0335   K 3.6 05/11/2023 0601   CL 100 05/10/2023 0335   CO2 30 05/10/2023 0335   GLUCOSE 154 (H) 05/10/2023 0335   BUN 10 05/10/2023 0335   CREATININE 0.76 05/10/2023 0335   CREATININE 0.79 01/28/2023 0000   CALCIUM 8.8 (L) 05/10/2023 0335   GFRNONAA >60 05/10/2023 0335   GFRNONAA 74 08/17/2020 0814   GFRAA 86 08/17/2020 0814    Lipid Panel     Component Value Date/Time    CHOL 158 01/28/2023 0000   TRIG 124 01/28/2023 0000   HDL 51 01/28/2023 0000   CHOLHDL 3.1 01/28/2023 0000   LDLCALC 84 01/28/2023 0000    CBC    Component Value Date/Time   WBC 15.2 (H) 05/11/2023 0601   RBC 3.52 (L) 05/11/2023 0601   HGB 8.4 (L) 05/11/2023 0601   HCT 26.2 (L) 05/11/2023 0601   PLT 547 (H) 05/11/2023 0601   MCV 74.4 (L) 05/11/2023 0601   MCH 23.9 (L) 05/11/2023 0601   MCHC 32.1 05/11/2023 0601   RDW 17.2 (H) 05/11/2023 0601   LYMPHSABS 4.5 (H) 05/11/2023 0601   MONOABS 0.9 05/11/2023 0601   EOSABS 0.1 05/11/2023 0601   BASOSABS 0.1 05/11/2023 0601    Hgb A1C Lab Results  Component Value Date   HGBA1C 6.3 (H) 01/28/2023       Assessment and Plan:  Left-sided rib fracture status post fall, pleural effusion:  Will obtain x-ray left side of ribs/chest x-ray in 1 week for reevaluation of rib fracture healing and pleural effusion Advised her to alternate heat and ice Recommend Tylenol 1000 g 4 times daily Continue splinting as needed when coughing, movement or with deep breathing FMLA forms will be completed per her request  RTC in 2 months for annual exam Follow Up Instructions:    I discussed the assessment and treatment plan with the patient. The patient was provided an opportunity to ask questions and all were answered. The patient agreed with the plan and demonstrated an understanding of the instructions.   The patient was advised to call back or seek an in-person evaluation if the symptoms worsen or if the condition fails to improve as anticipated.    Nicki Reaper, NP

## 2023-05-29 ENCOUNTER — Telehealth: Payer: Self-pay | Admitting: Internal Medicine

## 2023-05-29 NOTE — Telephone Encounter (Signed)
Copied from CRM [id number]. Topic: General - Inquiry >> May 28, 2023  2:18 PM Marlow Baars wrote: Reason for CRM: The patient called in with the fax number to send her FMLA paperwork. The number is [phone] to HR for her employer. Please send and assist her further >> May 28, 2023  2:30 PM Rachell B wrote: Paper work was  faxed to the number that was  given the  original was mailed to the pt.  Pt called back and needs the letter faxed to her HR employer stating she can do light duty work.  Pt needs this letter for light duty work to be sent today.  She states she cannot be out of work until 06/29/2023.

## 2023-05-29 NOTE — Telephone Encounter (Signed)
Patient says a letter stating she can do light duty can just be faxed.  Letter sent.

## 2023-05-29 NOTE — Telephone Encounter (Signed)
Copied from CRM [id number]. Topic: General - Inquiry >> May 28, 2023  2:18 PM Marlow Baars wrote: Reason for CRM: The patient called in with the fax number to send her FMLA paperwork. The number is [phone] to HR for her employer. Please send and assist her further >> May 28, 2023  2:30 PM Rachell B wrote: Paper work was  faxed to the number that was  given the  original was mailed to the pt.

## 2023-06-01 ENCOUNTER — Emergency Department
Admission: EM | Admit: 2023-06-01 | Discharge: 2023-06-01 | Disposition: A | Discharge status: Home / Self Care | Payer: 59 | Source: Home / Self Care | Attending: Emergency Medicine | Admitting: Emergency Medicine

## 2023-06-01 ENCOUNTER — Other Ambulatory Visit: Payer: Self-pay

## 2023-06-01 ENCOUNTER — Emergency Department: Payer: 59

## 2023-06-01 ENCOUNTER — Encounter: Payer: Self-pay | Admitting: *Deleted

## 2023-06-01 DIAGNOSIS — Z833 Family history of diabetes mellitus: Secondary | ICD-10-CM | POA: Diagnosis not present

## 2023-06-01 DIAGNOSIS — Z743 Need for continuous supervision: Secondary | ICD-10-CM | POA: Diagnosis not present

## 2023-06-01 DIAGNOSIS — S2249XA Multiple fractures of ribs, unspecified side, initial encounter for closed fracture: Secondary | ICD-10-CM | POA: Diagnosis not present

## 2023-06-01 DIAGNOSIS — J90 Pleural effusion, not elsewhere classified: Secondary | ICD-10-CM | POA: Insufficient documentation

## 2023-06-01 DIAGNOSIS — I700 Atherosclerosis of aorta: Secondary | ICD-10-CM | POA: Diagnosis not present

## 2023-06-01 DIAGNOSIS — R069 Unspecified abnormalities of breathing: Secondary | ICD-10-CM | POA: Diagnosis not present

## 2023-06-01 DIAGNOSIS — I10 Essential (primary) hypertension: Secondary | ICD-10-CM | POA: Diagnosis not present

## 2023-06-01 DIAGNOSIS — R918 Other nonspecific abnormal finding of lung field: Secondary | ICD-10-CM | POA: Diagnosis not present

## 2023-06-01 DIAGNOSIS — E785 Hyperlipidemia, unspecified: Secondary | ICD-10-CM | POA: Diagnosis not present

## 2023-06-01 DIAGNOSIS — Z6839 Body mass index (BMI) 39.0-39.9, adult: Secondary | ICD-10-CM | POA: Diagnosis not present

## 2023-06-01 DIAGNOSIS — Z888 Allergy status to other drugs, medicaments and biological substances status: Secondary | ICD-10-CM | POA: Diagnosis not present

## 2023-06-01 DIAGNOSIS — G4733 Obstructive sleep apnea (adult) (pediatric): Secondary | ICD-10-CM | POA: Diagnosis not present

## 2023-06-01 DIAGNOSIS — J40 Bronchitis, not specified as acute or chronic: Secondary | ICD-10-CM | POA: Insufficient documentation

## 2023-06-01 DIAGNOSIS — Z87891 Personal history of nicotine dependence: Secondary | ICD-10-CM | POA: Diagnosis not present

## 2023-06-01 DIAGNOSIS — R6889 Other general symptoms and signs: Secondary | ICD-10-CM | POA: Diagnosis not present

## 2023-06-01 DIAGNOSIS — Z8701 Personal history of pneumonia (recurrent): Secondary | ICD-10-CM | POA: Diagnosis not present

## 2023-06-01 DIAGNOSIS — I2510 Atherosclerotic heart disease of native coronary artery without angina pectoris: Secondary | ICD-10-CM | POA: Diagnosis not present

## 2023-06-01 DIAGNOSIS — J9811 Atelectasis: Secondary | ICD-10-CM | POA: Diagnosis not present

## 2023-06-01 DIAGNOSIS — E66813 Obesity, class 3: Secondary | ICD-10-CM | POA: Diagnosis present

## 2023-06-01 DIAGNOSIS — R062 Wheezing: Secondary | ICD-10-CM | POA: Diagnosis not present

## 2023-06-01 DIAGNOSIS — Z48813 Encounter for surgical aftercare following surgery on the respiratory system: Secondary | ICD-10-CM | POA: Diagnosis not present

## 2023-06-01 DIAGNOSIS — J9601 Acute respiratory failure with hypoxia: Secondary | ICD-10-CM | POA: Diagnosis not present

## 2023-06-01 DIAGNOSIS — R0902 Hypoxemia: Secondary | ICD-10-CM | POA: Diagnosis not present

## 2023-06-01 DIAGNOSIS — Z803 Family history of malignant neoplasm of breast: Secondary | ICD-10-CM | POA: Diagnosis not present

## 2023-06-01 DIAGNOSIS — Z79899 Other long term (current) drug therapy: Secondary | ICD-10-CM | POA: Diagnosis not present

## 2023-06-01 DIAGNOSIS — F32A Depression, unspecified: Secondary | ICD-10-CM | POA: Diagnosis present

## 2023-06-01 DIAGNOSIS — J209 Acute bronchitis, unspecified: Secondary | ICD-10-CM | POA: Diagnosis not present

## 2023-06-01 DIAGNOSIS — E876 Hypokalemia: Secondary | ICD-10-CM | POA: Diagnosis not present

## 2023-06-01 DIAGNOSIS — J9621 Acute and chronic respiratory failure with hypoxia: Secondary | ICD-10-CM | POA: Diagnosis not present

## 2023-06-01 DIAGNOSIS — I517 Cardiomegaly: Secondary | ICD-10-CM | POA: Diagnosis not present

## 2023-06-01 DIAGNOSIS — R0602 Shortness of breath: Secondary | ICD-10-CM | POA: Diagnosis not present

## 2023-06-01 DIAGNOSIS — F419 Anxiety disorder, unspecified: Secondary | ICD-10-CM | POA: Diagnosis present

## 2023-06-01 LAB — CBC
HCT: 33.5 % — ABNORMAL LOW (ref 36.0–46.0)
Hemoglobin: 10.2 g/dL — ABNORMAL LOW (ref 12.0–15.0)
MCH: 23.4 pg — ABNORMAL LOW (ref 26.0–34.0)
MCHC: 30.4 g/dL (ref 30.0–36.0)
MCV: 77 fL — ABNORMAL LOW (ref 80.0–100.0)
Platelets: 418 10*3/uL — ABNORMAL HIGH (ref 150–400)
RBC: 4.35 MIL/uL (ref 3.87–5.11)
RDW: 17.5 % — ABNORMAL HIGH (ref 11.5–15.5)
WBC: 8.9 10*3/uL (ref 4.0–10.5)
nRBC: 0 % (ref 0.0–0.2)

## 2023-06-01 LAB — BASIC METABOLIC PANEL
Anion gap: 10 (ref 5–15)
BUN: <5 mg/dL — ABNORMAL LOW (ref 6–20)
CO2: 27 mmol/L (ref 22–32)
Calcium: 8.8 mg/dL — ABNORMAL LOW (ref 8.9–10.3)
Chloride: 103 mmol/L (ref 98–111)
Creatinine, Ser: 0.82 mg/dL (ref 0.44–1.00)
GFR, Estimated: >60 mL/min (ref >60)
Glucose, Bld: 108 mg/dL — ABNORMAL HIGH (ref 70–99)
Potassium: 3.3 mmol/L — ABNORMAL LOW (ref 3.5–5.1)
Sodium: 140 mmol/L (ref 135–145)

## 2023-06-01 LAB — TROPONIN I (HIGH SENSITIVITY): Troponin I (High Sensitivity): 3 ng/L (ref <18)

## 2023-06-01 MED ORDER — ALBUTEROL SULFATE HFA 108 (90 BASE) MCG/ACT IN AERS
2.0000 | INHALATION_SPRAY | Freq: Four times a day (QID) | RESPIRATORY_TRACT | 2 refills | Status: DC | PRN
Start: 2023-06-01 — End: 2023-06-19

## 2023-06-01 MED ORDER — IPRATROPIUM-ALBUTEROL 0.5-2.5 (3) MG/3ML IN SOLN
3.0000 mL | Freq: Once | RESPIRATORY_TRACT | Status: AC
  Administered 2023-06-01: 3 mL via RESPIRATORY_TRACT
  Filled 2023-06-01: qty 3

## 2023-06-01 NOTE — ED Triage Notes (Signed)
Pt reports onset of wheezing and SOB since yesterday. Pt says she has broken ribs and since then she has had problems with her breathing, but now she has increasing shortness of breath with walking. Denies fevers.

## 2023-06-01 NOTE — ED Provider Notes (Signed)
[facility] Provider Note    Event Date/Time   First MD Initiated Contact with Patient 06/01/23 [phone]     (approximate)   History   Shortness of Breath   HPI  Courtney Carrillo is a 57 y.o. female  who presents to the emergency department today because of concern for shortness of breath. Symptoms have been present since the patient had an admission and discharge roughly 3 [place] ago for pleural effusion after rib fracture secondary to fall. Since that time she has been having increased shortness of breath. The patient has been seeing her primary care for this and had a post hospital x-ray which showed a pleural effusion. Today the breathing has gotten worse and she feels like she has been having some wheezing as well. She denies any chest pain. Denies any fevers.       Physical Exam   Triage Vital Signs: ED Triage Vitals  Encounter Vitals Group     BP 06/01/23 0550 (!) 168/92     Systolic BP Percentile --      Diastolic BP Percentile --      Pulse Rate 06/01/23 0550 81     Resp 06/01/23 0550 20     Temp 06/01/23 0551 98.2 F (36.8 C)     Temp Source 06/01/23 0551 Oral     SpO2 06/01/23 0550 96 %     Weight --      Height --      Head Circumference --      Peak Flow --      Pain Score 06/01/23 0552 0     Pain Loc --      Pain Education --      Exclude from Growth Chart --     Most recent vital signs: Vitals:   06/01/23 0550 06/01/23 0551  BP: (!) 168/92   Pulse: 81   Resp: 20   Temp:  98.2 F (36.8 C)  SpO2: 96%    General: Awake, alert, oriented. CV:  Good peripheral perfusion. Regular rate and rhythm. Resp:  Normal effort. Lungs clear. Abd:  No distention.    ED Results / Procedures / Treatments   Labs (all labs ordered are listed, but only abnormal results are displayed) Labs Reviewed  CBC - Abnormal; Notable for the following components:      Result Value   Hemoglobin 10.2 (*)    HCT 33.5 (*)    MCV 77.0 (*)    MCH 23.4 (*)     RDW 17.5 (*)    Platelets 418 (*)    All other components within normal limits  BASIC METABOLIC PANEL - Abnormal; Notable for the following components:   Potassium 3.3 (*)    Glucose, Bld 108 (*)    BUN <5 (*)    Calcium 8.8 (*)    All other components within normal limits  TROPONIN I (HIGH SENSITIVITY)     EKG I, Phineas Semen, attending physician, personally viewed and interpreted this EKG  EKG Time: 0550 Rate: 80 Rhythm: normal sinus rhythm Axis: normal Intervals: qtc 438 QRS: narrow, q waves v1, v2 ST changes: no st elevation Impression: abnormal ekg   RADIOLOGY I independently interpreted and visualized the CXR. My interpretation: left sided pleural effusion Radiology interpretation:  IMPRESSION:  1. Moderate left pleural effusion with overlying subsegmental  atelectasis. Similar in volume to 05/15/2023.  2. Central airway thickening compatible with bronchitis.      PROCEDURES:  Critical Care performed:  No   MEDICATIONS ORDERED IN ED: Medications - No data to display   IMPRESSION / MDM / ASSESSMENT AND PLAN / ED COURSE  I reviewed the triage vital signs and the nursing notes.                              Differential diagnosis includes, but is not limited to, pneumonia, viral uri, pleural effusion  Patient's presentation is most consistent with acute presentation with potential threat to life or bodily function.  Patient presents to the emergency department today because of concern for shortness of breath. On exam patient in no respiratory distress. Lungs without wheezing. Chest x-ray again shows pleural effusion seen a couple of Sigmund ago. No clear pneumonia. Is concerning for possible bronchitis. Will try duoneb treatment to see if that helps with patient's symptoms.  Patient felt better after breathing treatment. Will plan on discharging with prescription for albuterol inhaler. Encouraged follow up with primary care.      FINAL CLINICAL  IMPRESSION(S) / ED DIAGNOSES   Final diagnoses:  Bronchitis  Pleural effusion        Note:  This document was prepared using Dragon voice recognition software and may include unintentional dictation errors.    Phineas Semen, MD 06/01/23 1020

## 2023-06-02 ENCOUNTER — Emergency Department: Payer: 59

## 2023-06-02 ENCOUNTER — Ambulatory Visit: Payer: Self-pay | Admitting: *Deleted

## 2023-06-02 ENCOUNTER — Other Ambulatory Visit: Payer: Self-pay

## 2023-06-02 ENCOUNTER — Inpatient Hospital Stay
Admission: EM | Admit: 2023-06-02 | Discharge: 2023-06-05 | DRG: 202 | Disposition: A | Discharge status: Home / Self Care | Payer: 59 | Attending: Internal Medicine | Admitting: Internal Medicine

## 2023-06-02 DIAGNOSIS — I700 Atherosclerosis of aorta: Secondary | ICD-10-CM | POA: Diagnosis not present

## 2023-06-02 DIAGNOSIS — Z6839 Body mass index (BMI) 39.0-39.9, adult: Secondary | ICD-10-CM

## 2023-06-02 DIAGNOSIS — Z6841 Body Mass Index (BMI) 40.0 and over, adult: Secondary | ICD-10-CM | POA: Diagnosis present

## 2023-06-02 DIAGNOSIS — Z9889 Other specified postprocedural states: Secondary | ICD-10-CM

## 2023-06-02 DIAGNOSIS — R0602 Shortness of breath: Secondary | ICD-10-CM | POA: Diagnosis not present

## 2023-06-02 DIAGNOSIS — I2510 Atherosclerotic heart disease of native coronary artery without angina pectoris: Secondary | ICD-10-CM | POA: Diagnosis present

## 2023-06-02 DIAGNOSIS — J40 Bronchitis, not specified as acute or chronic: Secondary | ICD-10-CM | POA: Insufficient documentation

## 2023-06-02 DIAGNOSIS — Z803 Family history of malignant neoplasm of breast: Secondary | ICD-10-CM

## 2023-06-02 DIAGNOSIS — J9601 Acute respiratory failure with hypoxia: Secondary | ICD-10-CM | POA: Diagnosis present

## 2023-06-02 DIAGNOSIS — G4733 Obstructive sleep apnea (adult) (pediatric): Secondary | ICD-10-CM | POA: Diagnosis present

## 2023-06-02 DIAGNOSIS — E66812 Obesity, class 2: Secondary | ICD-10-CM | POA: Diagnosis present

## 2023-06-02 DIAGNOSIS — Z833 Family history of diabetes mellitus: Secondary | ICD-10-CM

## 2023-06-02 DIAGNOSIS — J9621 Acute and chronic respiratory failure with hypoxia: Secondary | ICD-10-CM | POA: Diagnosis present

## 2023-06-02 DIAGNOSIS — Z79899 Other long term (current) drug therapy: Secondary | ICD-10-CM

## 2023-06-02 DIAGNOSIS — F411 Generalized anxiety disorder: Secondary | ICD-10-CM | POA: Diagnosis present

## 2023-06-02 DIAGNOSIS — Z888 Allergy status to other drugs, medicaments and biological substances status: Secondary | ICD-10-CM

## 2023-06-02 DIAGNOSIS — Z87891 Personal history of nicotine dependence: Secondary | ICD-10-CM

## 2023-06-02 DIAGNOSIS — E876 Hypokalemia: Secondary | ICD-10-CM | POA: Diagnosis present

## 2023-06-02 DIAGNOSIS — E785 Hyperlipidemia, unspecified: Secondary | ICD-10-CM | POA: Diagnosis present

## 2023-06-02 DIAGNOSIS — R0902 Hypoxemia: Secondary | ICD-10-CM | POA: Insufficient documentation

## 2023-06-02 DIAGNOSIS — S2249XA Multiple fractures of ribs, unspecified side, initial encounter for closed fracture: Secondary | ICD-10-CM | POA: Diagnosis not present

## 2023-06-02 DIAGNOSIS — Z743 Need for continuous supervision: Secondary | ICD-10-CM | POA: Diagnosis not present

## 2023-06-02 DIAGNOSIS — I10 Essential (primary) hypertension: Secondary | ICD-10-CM | POA: Diagnosis present

## 2023-06-02 DIAGNOSIS — R6889 Other general symptoms and signs: Secondary | ICD-10-CM | POA: Diagnosis not present

## 2023-06-02 DIAGNOSIS — F32A Depression, unspecified: Secondary | ICD-10-CM | POA: Diagnosis present

## 2023-06-02 DIAGNOSIS — J90 Pleural effusion, not elsewhere classified: Principal | ICD-10-CM | POA: Diagnosis present

## 2023-06-02 DIAGNOSIS — F419 Anxiety disorder, unspecified: Secondary | ICD-10-CM | POA: Diagnosis present

## 2023-06-02 DIAGNOSIS — E66813 Body mass index (BMI) 40.0-44.9, adult: Secondary | ICD-10-CM | POA: Diagnosis present

## 2023-06-02 DIAGNOSIS — Z8701 Personal history of pneumonia (recurrent): Secondary | ICD-10-CM

## 2023-06-02 DIAGNOSIS — R062 Wheezing: Secondary | ICD-10-CM | POA: Diagnosis not present

## 2023-06-02 DIAGNOSIS — J209 Acute bronchitis, unspecified: Principal | ICD-10-CM | POA: Diagnosis present

## 2023-06-02 DIAGNOSIS — R069 Unspecified abnormalities of breathing: Secondary | ICD-10-CM | POA: Diagnosis not present

## 2023-06-02 LAB — LACTIC ACID, PLASMA
Lactic Acid, Venous: 3.2 mmol/L (ref 0.5–1.9)
Lactic Acid, Venous: 2.5 mmol/L (ref 0.5–1.9)

## 2023-06-02 LAB — CBC
HCT: 38.7 % (ref 36.0–46.0)
Hemoglobin: 11.4 g/dL — ABNORMAL LOW (ref 12.0–15.0)
MCH: 23.2 pg — ABNORMAL LOW (ref 26.0–34.0)
MCHC: 29.5 g/dL — ABNORMAL LOW (ref 30.0–36.0)
MCV: 78.7 fL — ABNORMAL LOW (ref 80.0–100.0)
Platelets: 468 10*3/uL — ABNORMAL HIGH (ref 150–400)
RBC: 4.92 MIL/uL (ref 3.87–5.11)
RDW: 17.5 % — ABNORMAL HIGH (ref 11.5–15.5)
WBC: 9.6 10*3/uL (ref 4.0–10.5)
nRBC: 0 % (ref 0.0–0.2)

## 2023-06-02 LAB — COMPREHENSIVE METABOLIC PANEL
ALT: 19 U/L (ref 0–44)
AST: 23 U/L (ref 15–41)
Albumin: 4 g/dL (ref 3.5–5.0)
Alkaline Phosphatase: 84 U/L (ref 38–126)
Anion gap: 14 (ref 5–15)
BUN: <5 mg/dL — ABNORMAL LOW (ref 6–20)
CO2: 23 mmol/L (ref 22–32)
Calcium: 9.1 mg/dL (ref 8.9–10.3)
Chloride: 104 mmol/L (ref 98–111)
Creatinine, Ser: 0.83 mg/dL (ref 0.44–1.00)
GFR, Estimated: >60 mL/min (ref >60)
Glucose, Bld: 132 mg/dL — ABNORMAL HIGH (ref 70–99)
Potassium: 3.7 mmol/L (ref 3.5–5.1)
Sodium: 141 mmol/L (ref 135–145)
Total Bilirubin: 1.2 mg/dL (ref 0.3–1.2)
Total Protein: 7.9 g/dL (ref 6.5–8.1)

## 2023-06-02 LAB — BRAIN NATRIURETIC PEPTIDE: B Natriuretic Peptide: 28 pg/mL (ref 0.0–100.0)

## 2023-06-02 LAB — TROPONIN I (HIGH SENSITIVITY): Troponin I (High Sensitivity): 5 ng/L (ref <18)

## 2023-06-02 LAB — SEDIMENTATION RATE: Sed Rate: 36 mm/h — ABNORMAL HIGH (ref 0–30)

## 2023-06-02 LAB — PROCALCITONIN: Procalcitonin: <0.1 ng/mL

## 2023-06-02 MED ORDER — ONDANSETRON HCL 4 MG/2ML IJ SOLN: 4.0000 mg | Freq: Four times a day (QID) | INTRAMUSCULAR | Status: DC | PRN

## 2023-06-02 MED ORDER — DOXEPIN HCL 50 MG PO CAPS
50.0000 mg | ORAL_CAPSULE | Freq: Every day | ORAL | Status: DC
  Administered 2023-06-03 – 2023-06-04 (×2): 50 mg via ORAL
  Filled 2023-06-02 (×2): qty 1

## 2023-06-02 MED ORDER — LIDOCAINE 5 % EX PTCH: 1.0000 | MEDICATED_PATCH | Freq: Two times a day (BID) | CUTANEOUS | Status: DC

## 2023-06-02 MED ORDER — CARIPRAZINE HCL 1.5 MG PO CAPS
6.0000 mg | ORAL_CAPSULE | Freq: Every day | ORAL | Status: DC
  Administered 2023-06-03 – 2023-06-05 (×3): 6 mg via ORAL
  Filled 2023-06-02 (×3): qty 4

## 2023-06-02 MED ORDER — ACETAMINOPHEN 650 MG RE SUPP
650.0000 mg | Freq: Four times a day (QID) | RECTAL | Status: DC | PRN
Start: 2023-06-02 — End: 2023-06-02

## 2023-06-02 MED ORDER — METHYLPREDNISOLONE SODIUM SUCC 40 MG IJ SOLR
40.0000 mg | Freq: Once | INTRAMUSCULAR | Status: AC
Start: 2023-06-02 — End: 2023-06-02
  Administered 2023-06-02: 40 mg via INTRAVENOUS
  Filled 2023-06-02: qty 1

## 2023-06-02 MED ORDER — ACETAMINOPHEN-CODEINE 300-30 MG PO TABS
1.0000 | ORAL_TABLET | Freq: Three times a day (TID) | ORAL | Status: DC | PRN
  Administered 2023-06-03: 1 via ORAL
  Filled 2023-06-02: qty 1

## 2023-06-02 MED ORDER — ATORVASTATIN CALCIUM 20 MG PO TABS
40.0000 mg | ORAL_TABLET | Freq: Every day | ORAL | Status: DC
  Administered 2023-06-02 – 2023-06-05 (×4): 40 mg via ORAL
  Filled 2023-06-02 (×4): qty 2

## 2023-06-02 MED ORDER — CARVEDILOL 6.25 MG PO TABS
3.1250 mg | ORAL_TABLET | Freq: Two times a day (BID) | ORAL | Status: DC
  Administered 2023-06-02 – 2023-06-05 (×6): 3.125 mg via ORAL
  Filled 2023-06-02 (×6): qty 1

## 2023-06-02 MED ORDER — AMLODIPINE BESYLATE 5 MG PO TABS
5.0000 mg | ORAL_TABLET | Freq: Every day | ORAL | Status: DC
Start: 2023-06-03 — End: 2023-06-05
  Administered 2023-06-03 – 2023-06-05 (×3): 5 mg via ORAL
  Filled 2023-06-02 (×3): qty 1

## 2023-06-02 MED ORDER — METHOCARBAMOL 500 MG PO TABS
500.0000 mg | ORAL_TABLET | Freq: Three times a day (TID) | ORAL | Status: DC | PRN
  Administered 2023-06-05: 500 mg via ORAL
  Filled 2023-06-02: qty 1

## 2023-06-02 MED ORDER — ONDANSETRON HCL 4 MG PO TABS: 4.0000 mg | ORAL_TABLET | Freq: Four times a day (QID) | ORAL | Status: DC | PRN

## 2023-06-02 MED ORDER — BUSPIRONE HCL 10 MG PO TABS
20.0000 mg | ORAL_TABLET | Freq: Two times a day (BID) | ORAL | Status: DC
  Administered 2023-06-02 – 2023-06-05 (×6): 20 mg via ORAL
  Filled 2023-06-02 (×2): qty 2
  Filled 2023-06-02: qty 4
  Filled 2023-06-02 (×2): qty 2
  Filled 2023-06-02: qty 4

## 2023-06-02 MED ORDER — PREDNISONE 20 MG PO TABS
40.0000 mg | ORAL_TABLET | Freq: Every day | ORAL | Status: DC
  Administered 2023-06-03 – 2023-06-05 (×3): 40 mg via ORAL
  Filled 2023-06-02 (×3): qty 2

## 2023-06-02 MED ORDER — IPRATROPIUM-ALBUTEROL 0.5-2.5 (3) MG/3ML IN SOLN
3.0000 mL | Freq: Four times a day (QID) | RESPIRATORY_TRACT | Status: DC
Start: 2023-06-02 — End: 2023-06-03
  Administered 2023-06-02 – 2023-06-03 (×6): 3 mL via RESPIRATORY_TRACT
  Filled 2023-06-02 (×4): qty 3
  Filled 2023-06-02: qty 6
  Filled 2023-06-02: qty 3

## 2023-06-02 MED ORDER — FUROSEMIDE 10 MG/ML IJ SOLN: 40.0000 mg | Freq: Once | INTRAMUSCULAR | Status: DC

## 2023-06-02 MED ORDER — BUDESONIDE 0.25 MG/2ML IN SUSP
0.2500 mg | Freq: Two times a day (BID) | RESPIRATORY_TRACT | Status: DC
  Administered 2023-06-02 – 2023-06-05 (×6): 0.25 mg via RESPIRATORY_TRACT
  Filled 2023-06-02 (×6): qty 2

## 2023-06-02 MED ORDER — ALBUTEROL SULFATE (2.5 MG/3ML) 0.083% IN NEBU
3.0000 mL | INHALATION_SOLUTION | Freq: Four times a day (QID) | RESPIRATORY_TRACT | Status: DC | PRN
  Administered 2023-06-02 – 2023-06-05 (×6): 3 mL via RESPIRATORY_TRACT
  Filled 2023-06-02 (×6): qty 3

## 2023-06-02 MED ORDER — IOHEXOL 350 MG/ML SOLN
75.0000 mL | Freq: Once | INTRAVENOUS | Status: AC | PRN
  Administered 2023-06-02: 75 mL via INTRAVENOUS

## 2023-06-02 MED ORDER — ENOXAPARIN SODIUM 60 MG/0.6ML IJ SOSY
0.5000 mg/kg | PREFILLED_SYRINGE | INTRAMUSCULAR | Status: DC
  Administered 2023-06-02: 52.5 mg via SUBCUTANEOUS
  Filled 2023-06-02: qty 0.6

## 2023-06-02 MED ORDER — IBUPROFEN 400 MG PO TABS
800.0000 mg | ORAL_TABLET | Freq: Three times a day (TID) | ORAL | Status: DC | PRN
  Administered 2023-06-03: 800 mg via ORAL
  Administered 2023-06-05: 400 mg via ORAL
  Filled 2023-06-02 (×2): qty 2

## 2023-06-02 MED ORDER — CARIPRAZINE HCL 6 MG PO CAPS: 1.0000 | ORAL_CAPSULE | Freq: Every day | ORAL | Status: DC

## 2023-06-02 MED ORDER — BENZTROPINE MESYLATE 0.5 MG PO TABS
1.0000 mg | ORAL_TABLET | Freq: Every morning | ORAL | Status: DC
Start: 2023-06-03 — End: 2023-06-05
  Administered 2023-06-03 – 2023-06-05 (×3): 1 mg via ORAL
  Filled 2023-06-02 (×3): qty 2
  Filled 2023-06-02: qty 1

## 2023-06-02 MED ORDER — ACETAMINOPHEN 325 MG PO TABS
650.0000 mg | ORAL_TABLET | Freq: Four times a day (QID) | ORAL | Status: DC | PRN
Start: 2023-06-02 — End: 2023-06-02

## 2023-06-02 NOTE — Telephone Encounter (Signed)
Noted. She was just seen again yesterday. Will await ER evaluation.

## 2023-06-02 NOTE — ED Provider Notes (Signed)
[facility] Provider Note    Event Date/Time   First MD Initiated Contact with Patient 06/02/23 1249     (approximate)   History   No chief complaint on file.   HPI  Courtney Carrillo is a 57 y.o. female with history of pneumonia complicated by effusion who comes in with concerns for shortness of breath.  I reviewed patient's hospital admission from September 2024 where patient was put on 2 Ollinger of Augmentin and had a thoracentesis done.  She comes in for worsening shortness of breath seen here yesterday.  She does not have any history of COPD or asthma was sent home on an inhaler to try to help with shortness of breath.  She reports worsening symptoms today and having difficulty taking a few steps due to shortness of breath.   Physical Exam   Triage Vital Signs: ED Triage Vitals [06/02/23 1043]  Encounter Vitals Group     BP (!) 151/99     Systolic BP Percentile      Diastolic BP Percentile      Pulse Rate 100     Resp (!) 21     Temp 98 F (36.7 C)     Temp src      SpO2 96 %     Weight 235 lb (106.6 kg)     Height 5' 4.5" (1.638 m)     Head Circumference      Peak Flow      Pain Score 0     Pain Loc      Pain Education      Exclude from Growth Chart     Most recent vital signs: Vitals:   06/02/23 1043  BP: (!) 151/99  Pulse: 100  Resp: (!) 21  Temp: 98 F (36.7 C)  SpO2: 96%     General: Awake, no distress.  CV:  Good peripheral perfusion.  Resp:  Normal effort.  No wheezing Abd:  No distention.  Other:  No leg swelling   ED Results / Procedures / Treatments   Labs (all labs ordered are listed, but only abnormal results are displayed) Labs Reviewed  CBC - Abnormal; Notable for the following components:      Result Value   Hemoglobin 11.4 (*)    MCV 78.7 (*)    MCH 23.2 (*)    MCHC 29.5 (*)    RDW 17.5 (*)    Platelets 468 (*)    All other components within normal limits  COMPREHENSIVE METABOLIC PANEL - Abnormal;  Notable for the following components:   Glucose, Bld 132 (*)    BUN <5 (*)    All other components within normal limits  LACTIC ACID, PLASMA - Abnormal; Notable for the following components:   Lactic Acid, Venous 3.2 (*)    All other components within normal limits  LACTIC ACID, PLASMA  TROPONIN I (HIGH SENSITIVITY)     EKG  My interpretation of EKG:  Normal sinus rate 97 without any ST shin or T wave inversions, occasional PVC  RADIOLOGY I have reviewed the xray personally and interpreted and patient has a left-sided pleural effusion  PROCEDURES:  Critical Care performed: No  Procedures   MEDICATIONS ORDERED IN ED: Medications - No data to display   IMPRESSION / MDM / ASSESSMENT AND PLAN / ED COURSE  I reviewed the triage vital signs and the nursing notes.   Patient's presentation is most consistent with acute presentation with potential threat to  life or bodily function.   Patient comes in with worsening shortness of breath, pleural effusion noted on x-ray.  Discussed with patient that she probably needs to have a thoracentesis do not hear any wheezing to suggest COPD or asthma.  Will get CT PE to make sure no other blood culture other issues that could be causing this to happen again.  Lactate is slightly elevated at 3.2 CBC shows stable hemoglobin CMP is stable.  1:46 PM At this time patient does not appear septic wuith no fever and normal white count in nature CT results are pending but I will admit to the hospitalist team.   FINAL CLINICAL IMPRESSION(S) / ED DIAGNOSES   Final diagnoses:  Pleural effusion     Rx / DC Orders   ED Discharge Orders     None        Note:  This document was prepared using Dragon voice recognition software and may include unintentional dictation errors.   Concha Se, MD 06/02/23 [phone]

## 2023-06-02 NOTE — ED Notes (Signed)
Pt in CT.

## 2023-06-02 NOTE — ED Notes (Signed)
Pt. Returned to rm from CT, CT tech attemtped to help pt. Ambulate to bathroom. Pt. Became very SOB, states she cannot walk any further. Pt. Provided wheelchair, and when taken back to room 2L [place] initiated. Pt's O2 sat 95% on RA, 100% on 2L [place]. Pt. Appears very SOB, and recovers well, once resting.

## 2023-06-02 NOTE — H&P (Signed)
History and Physical    Courtney Carrillo NUU:[id number] DOB: 1966/07/10 DOA: 06/02/2023  PCP: Lorre Munroe, NP (Confirm with patient/family/NH records and if not entered, this has to be entered at [facility] point of entry) Patient coming from: Home  I have personally briefly reviewed patient's old medical records in [facility]  Chief Complaint: Wheezing, SOB  HPI: Courtney Carrillo is a 57 y.o. female with medical history significant of recent pneumonia and parapneumonic left-sided pleural effusion, HTN, HLD, sleep apnea on CPAP at bedtime, anxiety depression, presented with worsening of wheezing.  Symptoms started 2 days ago, patient developed wheezing and increasing exertional dyspnea denied any cough no chest pain no fever or chills.  She came to ED yesterday and her x-ray was clear, patient was diagnosed with bronchitis and sent home with albuterol as needed.  Patient has been using the albuterol several times with some help.  Today however patient symptoms became worse and decided come back to the ED again.  3 [place] ago, patient was hospitalized for new onset of left-sided pleural effusion and thoracentesis was done which showed parapneumonic pleural effusion secondary to pneumonia.  ED Course: Borderline tachycardia, blood pressure elevated.  O2 saturation 100% on room air.  CTA negative for PE but recurrent moderate to large loculated left-sided pleural effusion.  Lactic acid 3.2, hemoglobin 11.4, K3.7, creatinine 0.8  Review of Systems: As per HPI otherwise 14 point review of systems negative.    Past Medical History:  Diagnosis Date   Anemia    2015   Anxiety    Depression    Headache    H/O   Hyperlipidemia    Hypertension    Sleep apnea    CPAP    Past Surgical History:  Procedure Laterality Date   BACK SURGERY     LUMBAR   CARPAL TUNNEL RELEASE Right 06/19/2015   Procedure: CARPAL TUNNEL RELEASE;  Surgeon: Kennedy Bucker, MD;  Location: [facility] ORS;  Service: Orthopedics;   Laterality: Right;   COLONOSCOPY WITH PROPOFOL N/A 01/28/2021   Procedure: COLONOSCOPY WITH PROPOFOL;  Surgeon: Toney Reil, MD;  Location: [facility] ENDOSCOPY;  Service: Gastroenterology;  Laterality: N/A;   DILATION AND CURETTAGE OF UTERUS     FLEXIBLE SIGMOIDOSCOPY N/A 04/10/2021   Procedure: FLEXIBLE SIGMOIDOSCOPY;  Surgeon: Toney Reil, MD;  Location: [facility] ENDOSCOPY;  Service: Gastroenterology;  Laterality: N/A;     reports that she has quit smoking. Her smoking use included cigarettes. She started smoking about 20 months ago. She has a 17.6 pack-year smoking history. She has never used smokeless tobacco. She reports current drug use. Frequency: 7.00 times per week. Drug: Marijuana. She reports that she does not drink alcohol.  Allergies  Allergen Reactions   Lisinopril Cough   Losartan Cough   Other Other (See Comments)    Perfumes : Congestion    Family History  Problem Relation Age of Onset   Diabetes Mother    Breast cancer Sister     Prior to Admission medications   Medication Sig Start Date End Date Taking? Authorizing Provider  acetaminophen-codeine (TYLENOL #3) 300-30 MG tablet Take 1 tablet by mouth every 8 (eight) hours as needed for moderate pain (pain score 4-6). 05/28/23  Yes Baity, Salvadore Oxford, NP  albuterol (VENTOLIN HFA) 108 (90 Base) MCG/ACT inhaler Inhale 2 puffs into the lungs every 6 (six) hours as needed for wheezing or shortness of breath. 06/01/23  Yes Phineas Semen, MD  amLODipine (NORVASC) 5 MG tablet TAKE  1 TABLET(5 MG) BY MOUTH DAILY 05/20/23  Yes Baity, Salvadore Oxford, NP  atorvastatin (LIPITOR) 40 MG tablet TAKE 1 TABLET(40 MG) BY MOUTH EVERY MORNING 05/12/23  Yes Baity, Salvadore Oxford, NP  benztropine (COGENTIN) 1 MG tablet Take 1 mg by mouth every morning. 12/04/20  Yes [provider]  busPIRone (BUSPAR) 10 MG tablet Take 20 mg by mouth 2 (two) times daily. 07/23/20  Yes [provider]  doxepin (SINEQUAN) 50 MG capsule Take 50 mg by  mouth at bedtime. 12/04/20  Yes [provider]  ibuprofen (ADVIL) 800 MG tablet Take 1 tablet (800 mg total) by mouth every 8 (eight) hours as needed. 12/16/21  Yes Baity, Salvadore Oxford, NP  lidocaine (LIDODERM) 5 % Place 1 patch onto the skin every 12 (twelve) hours. Remove & Discard patch within 12 hours or as directed by MD 05/05/23 05/04/24 Yes Pilar Jarvis, MD  methocarbamol (ROBAXIN) 500 MG tablet Take 1 tablet (500 mg total) by mouth every 8 (eight) hours as needed for muscle spasms. 05/01/23  Yes Baity, Salvadore Oxford, NP  VRAYLAR 6 MG CAPS Take 1 capsule by mouth daily. 07/23/20  Yes [provider]  hydrOXYzine (ATARAX) 10 MG tablet Take 1 tablet (10 mg total) by mouth 3 (three) times daily as needed. Patient not taking: Reported on 06/02/2023 01/28/23   Lorre Munroe, NP    Physical Exam: Vitals:   06/02/23 1043 06/02/23 1330 06/02/23 1511 06/02/23 1629  BP: (!) 151/99   (!) 177/98  Pulse: 100 93 (!) 108 98  Resp: (!) 21 20 (!) 28 (!) 24  Temp: 98 F (36.7 C)   98.1 F (36.7 C)  TempSrc:    Oral  SpO2: 96% 100% 100% 100%  Weight: 106.6 kg     Height: 5' 4.5" (1.638 m)       Constitutional: NAD, calm, comfortable Vitals:   06/02/23 1043 06/02/23 1330 06/02/23 1511 06/02/23 1629  BP: (!) 151/99   (!) 177/98  Pulse: 100 93 (!) 108 98  Resp: (!) 21 20 (!) 28 (!) 24  Temp: 98 F (36.7 C)   98.1 F (36.7 C)  TempSrc:    Oral  SpO2: 96% 100% 100% 100%  Weight: 106.6 kg     Height: 5' 4.5" (1.638 m)      Eyes: PERRL, lids and conjunctivae normal ENMT: Mucous membranes are moist. Posterior pharynx clear of any exudate or lesions.Normal dentition.  Neck: normal, supple, no masses, no thyromegaly Respiratory: Diminished breathing sound bilaterally more on the left side, scattered wheezing, no crackles.  Increasing respiratory effort. No accessory muscle use.  Cardiovascular: Regular rate and rhythm, no murmurs / rubs / gallops. No extremity edema. 2+ pedal pulses. No  carotid bruits.  Abdomen: no tenderness, no masses palpated. No hepatosplenomegaly. Bowel sounds positive.  Musculoskeletal: no clubbing / cyanosis. No joint deformity upper and lower extremities. Good ROM, no contractures. Normal muscle tone.  Skin: no rashes, lesions, ulcers. No induration Neurologic: CN 2-12 grossly intact. Sensation intact, DTR normal. Strength 5/5 in all 4.  Psychiatric: Normal judgment and insight. Alert and oriented x 3. Normal mood.     Labs on Admission: I have personally reviewed following labs and imaging studies  CBC: Recent Labs  Lab 06/01/23 0557 06/02/23 1046  WBC 8.9 9.6  HGB 10.2* 11.4*  HCT 33.5* 38.7  MCV 77.0* 78.7*  PLT 418* 468*   Basic Metabolic Panel: Recent Labs  Lab 06/01/23 0557 06/02/23 1046  NA 140 141  K 3.3* 3.7  CL 103 104  CO2 27 23  GLUCOSE 108* 132*  BUN <5* <5*  CREATININE 0.82 0.83  CALCIUM 8.8* 9.1   GFR: Estimated Creatinine Clearance: 90 mL/min (by C-G formula based on SCr of 0.83 mg/dL). Liver Function Tests: Recent Labs  Lab 06/02/23 1046  AST 23  ALT 19  ALKPHOS 84  BILITOT 1.2  PROT 7.9  ALBUMIN 4.0   No results for input(s): "LIPASE", "AMYLASE" in the last 168 hours. No results for input(s): "AMMONIA" in the last 168 hours. Coagulation Profile: No results for input(s): "INR", "PROTIME" in the last 168 hours. Cardiac Enzymes: No results for input(s): "CKTOTAL", "CKMB", "CKMBINDEX", "TROPONINI" in the last 168 hours. BNP (last 3 results) No results for input(s): "PROBNP" in the last 8760 hours. HbA1C: No results for input(s): "HGBA1C" in the last 72 hours. CBG: No results for input(s): "GLUCAP" in the last 168 hours. Lipid Profile: No results for input(s): "CHOL", "HDL", "LDLCALC", "TRIG", "CHOLHDL", "LDLDIRECT" in the last 72 hours. Thyroid Function Tests: No results for input(s): "TSH", "T4TOTAL", "FREET4", "T3FREE", "THYROIDAB" in the last 72 hours. Anemia Panel: No results for input(s):  "VITAMINB12", "FOLATE", "FERRITIN", "TIBC", "IRON", "RETICCTPCT" in the last 72 hours. Urine analysis:    Component Value Date/Time   COLORURINE YELLOW (A) 05/10/2023 0907   APPEARANCEUR HAZY (A) 05/10/2023 0907   LABSPEC 1.018 05/10/2023 0907   PHURINE 5.0 05/10/2023 0907   GLUCOSEU NEGATIVE 05/10/2023 0907   HGBUR NEGATIVE 05/10/2023 0907   BILIRUBINUR NEGATIVE 05/10/2023 0907   KETONESUR NEGATIVE 05/10/2023 0907   PROTEINUR NEGATIVE 05/10/2023 0907   NITRITE NEGATIVE 05/10/2023 0907   LEUKOCYTESUR NEGATIVE 05/10/2023 4696    Radiological Exams on Admission: DG Chest 2 View  Result Date: 06/02/2023 CLINICAL DATA:  Shortness of breath. EXAM: CHEST - 2 VIEW COMPARISON:  X-ray 06/01/2023 FINDINGS: Stable moderate left effusion with some adjacent opacity. No pneumothorax or edema. Normal cardiopericardial silhouette. Degenerative changes along the spine. IMPRESSION: Moderate left effusion. Similar to previous. Please correlate with the history Electronically Signed   By: Karen Kays M.D.   On: 06/02/2023 13:41   DG Chest 2 View  Result Date: 06/01/2023 CLINICAL DATA:  Shortness of breath and wheezing. EXAM: CHEST - 2 VIEW COMPARISON:  05/15/2023 FINDINGS: Unchanged cardiomediastinal contours. Moderate left pleural effusion with overlying subsegmental atelectasis is similar in volume to the previous exam. Right lung appears clear. Central airway thickening noted bilaterally. No acute osseous findings. IMPRESSION: 1. Moderate left pleural effusion with overlying subsegmental atelectasis. Similar in volume to 05/15/2023. 2. Central airway thickening compatible with bronchitis. Electronically Signed   By: Signa Kell M.D.   On: 06/01/2023 06:31    EKG: Independently reviewed.  Sinus tachycardia with frequent PVCs  Assessment/Plan Active Problems:   Bronchitis  (please populate well all problems here in Problem List. (For example, if patient is on BP meds at home and you resume or  decide to hold them, it is a problem that needs to be her. Same for CAD, COPD, HLD and so on)  Recurrent left-sided pleural effusion -Thoracentesis done 3 [place] ago with the full study showing exudate, compatible with parapneumonic effusion secondary to pneumonia.  Her pneumonia was sufficiently treated and at this time, unclear the reason for recurrent left-sided pleural effusion.  Case was discussed with on-call pulmonary Dr. Karna Christmas, who recommended hold off therapeutic thoracentesis and Lasix today, pulmonary will see the patient tomorrow morning and decide treatment plan. -Other etiology, she denied any history of  SLE, no joint pain or muscle pain.  Will check ESR, CRP and procalcitonin. -Given the nature of pleural effusion is exudate and low BNP, CHF unlikely.  Acute bronchitis -Patient quit smoking 1 year ago, appears at baseline she does not have any COPD symptoms. -No previous PFTs.  For now we will treat for acute bronchitis with continue DuoNebs and breathing treatment, Pulmicort and trial of p.o. prednisone. -Hold off antibiotics  HTN, uncontrolled -Continue amlodipine, add Coreg  Anxiety/depression -Continue SSRI and doxepin  DVT prophylaxis: Lovenox Code Status: Full code Family Communication: None at bedside Disposition Plan: Expect less than 2 midnight hospital stay Consults called: Pulmonary Admission status: MedSurg admission   Emeline General MD [facility] Pager [phone]  06/02/2023, 5:32 PM

## 2023-06-02 NOTE — Telephone Encounter (Signed)
  Chief Complaint: trouble breathing Symptoms: patient states she needs 911- she is having trouble breathing and needs breathing treatment  Disposition: [x] ED /[] Urgent Care (no appt availability in office) / [] Appointment(In office/virtual)/ []  [place] [facility]/ [] Home Care/ [] Refused Recommended Disposition /[] [place] [facility]/ []  Follow-up with PCP Additional Notes: Patient has called the agent and states she is having trouble breathing and needs 911. Patient is currently driving- advised her to pull over, called 911 and obtained her location so 911 could get her the assistance she is requesting. Call transferred to 911 operator.

## 2023-06-02 NOTE — ED Triage Notes (Signed)
Pt to ED [facility] from side of road on the way to doctors office for shob. Was seen at ER yesterday. 1 duoneb PTA. Wheezing noted. No improvement with inhaler.

## 2023-06-02 NOTE — Telephone Encounter (Signed)
Reason for Disposition  Sounds like a life-threatening emergency to the triager  Answer Assessment - Initial Assessment Questions Patient is calling requesting 911- she states she is having trouble breathing  Protocols used: Breathing Difficulty-A-AH

## 2023-06-03 ENCOUNTER — Observation Stay (HOSPITAL_COMMUNITY)
Admit: 2023-06-03 | Discharge: 2023-06-03 | Disposition: A | Discharge status: Home / Self Care | Payer: 59 | Attending: Pulmonary Disease | Admitting: Pulmonary Disease

## 2023-06-03 ENCOUNTER — Observation Stay: Payer: 59

## 2023-06-03 ENCOUNTER — Other Ambulatory Visit (HOSPITAL_COMMUNITY): Payer: Self-pay

## 2023-06-03 DIAGNOSIS — E876 Hypokalemia: Secondary | ICD-10-CM | POA: Diagnosis present

## 2023-06-03 DIAGNOSIS — Z87891 Personal history of nicotine dependence: Secondary | ICD-10-CM | POA: Diagnosis not present

## 2023-06-03 DIAGNOSIS — Z48813 Encounter for surgical aftercare following surgery on the respiratory system: Secondary | ICD-10-CM | POA: Diagnosis not present

## 2023-06-03 DIAGNOSIS — Z833 Family history of diabetes mellitus: Secondary | ICD-10-CM | POA: Diagnosis not present

## 2023-06-03 DIAGNOSIS — Z6839 Body mass index (BMI) 39.0-39.9, adult: Secondary | ICD-10-CM | POA: Diagnosis not present

## 2023-06-03 DIAGNOSIS — J209 Acute bronchitis, unspecified: Secondary | ICD-10-CM | POA: Diagnosis present

## 2023-06-03 DIAGNOSIS — F32A Depression, unspecified: Secondary | ICD-10-CM | POA: Diagnosis present

## 2023-06-03 DIAGNOSIS — Z803 Family history of malignant neoplasm of breast: Secondary | ICD-10-CM | POA: Diagnosis not present

## 2023-06-03 DIAGNOSIS — R0902 Hypoxemia: Secondary | ICD-10-CM | POA: Diagnosis not present

## 2023-06-03 DIAGNOSIS — F419 Anxiety disorder, unspecified: Secondary | ICD-10-CM | POA: Diagnosis present

## 2023-06-03 DIAGNOSIS — I2510 Atherosclerotic heart disease of native coronary artery without angina pectoris: Secondary | ICD-10-CM | POA: Diagnosis present

## 2023-06-03 DIAGNOSIS — J40 Bronchitis, not specified as acute or chronic: Secondary | ICD-10-CM | POA: Diagnosis not present

## 2023-06-03 DIAGNOSIS — J90 Pleural effusion, not elsewhere classified: Secondary | ICD-10-CM | POA: Diagnosis not present

## 2023-06-03 DIAGNOSIS — R0602 Shortness of breath: Secondary | ICD-10-CM

## 2023-06-03 DIAGNOSIS — Z8701 Personal history of pneumonia (recurrent): Secondary | ICD-10-CM | POA: Diagnosis not present

## 2023-06-03 DIAGNOSIS — E785 Hyperlipidemia, unspecified: Secondary | ICD-10-CM | POA: Diagnosis present

## 2023-06-03 DIAGNOSIS — I10 Essential (primary) hypertension: Secondary | ICD-10-CM | POA: Diagnosis present

## 2023-06-03 DIAGNOSIS — Z888 Allergy status to other drugs, medicaments and biological substances status: Secondary | ICD-10-CM | POA: Diagnosis not present

## 2023-06-03 DIAGNOSIS — J9601 Acute respiratory failure with hypoxia: Secondary | ICD-10-CM | POA: Diagnosis not present

## 2023-06-03 DIAGNOSIS — J9621 Acute and chronic respiratory failure with hypoxia: Secondary | ICD-10-CM | POA: Diagnosis present

## 2023-06-03 DIAGNOSIS — G4733 Obstructive sleep apnea (adult) (pediatric): Secondary | ICD-10-CM | POA: Diagnosis present

## 2023-06-03 DIAGNOSIS — I517 Cardiomegaly: Secondary | ICD-10-CM | POA: Diagnosis not present

## 2023-06-03 DIAGNOSIS — Z79899 Other long term (current) drug therapy: Secondary | ICD-10-CM | POA: Diagnosis not present

## 2023-06-03 DIAGNOSIS — E66813 Obesity, class 3: Secondary | ICD-10-CM | POA: Diagnosis present

## 2023-06-03 LAB — GLUCOSE, PLEURAL OR PERITONEAL FLUID: Glucose, Fluid: 140 mg/dL

## 2023-06-03 LAB — BODY FLUID CELL COUNT WITH DIFFERENTIAL
Eos, Fluid: 0 %
Lymphs, Fluid: 76 %
Monocyte-Macrophage-Serous Fluid: 14 %
Neutrophil Count, Fluid: 10 %
Total Nucleated Cell Count, Fluid: 6402 uL

## 2023-06-03 LAB — ECHOCARDIOGRAM COMPLETE
AR max vel: 2.82 cm2
AV Area VTI: 2.8 cm2
AV Area mean vel: 2.65 cm2
AV Mean grad: 6 mm[Hg]
AV Peak grad: 13.5 mm[Hg]
Ao pk vel: 1.84 m/s
Area-P 1/2: 3.01 cm2
Height: 64.173 in
MV VTI: 3.33 cm2
S' Lateral: 2.3 cm
Weight: 3671.98 [oz_av]

## 2023-06-03 LAB — C-REACTIVE PROTEIN: CRP: 2.9 mg/dL — ABNORMAL HIGH (ref <1.0)

## 2023-06-03 MED ORDER — IPRATROPIUM-ALBUTEROL 0.5-2.5 (3) MG/3ML IN SOLN
3.0000 mL | Freq: Two times a day (BID) | RESPIRATORY_TRACT | Status: DC
  Administered 2023-06-04 – 2023-06-05 (×3): 3 mL via RESPIRATORY_TRACT
  Filled 2023-06-03 (×3): qty 3

## 2023-06-03 MED ORDER — LIDOCAINE HCL (PF) 1 % IJ SOLN
10.0000 mL | Freq: Once | INTRAMUSCULAR | Status: AC
  Administered 2023-06-03: 10 mL via SUBCUTANEOUS

## 2023-06-03 MED ORDER — ENSURE ENLIVE PO LIQD
237.0000 mL | Freq: Two times a day (BID) | ORAL | Status: DC
  Administered 2023-06-04: 237 mL via ORAL

## 2023-06-03 MED ORDER — GUAIFENESIN 100 MG/5ML PO LIQD
5.0000 mL | Freq: Once | ORAL | Status: AC
  Administered 2023-06-03: 5 mL via ORAL
  Filled 2023-06-03: qty 10

## 2023-06-03 MED ORDER — ENOXAPARIN SODIUM 60 MG/0.6ML IJ SOSY
50.0000 mg | PREFILLED_SYRINGE | INTRAMUSCULAR | Status: DC
  Administered 2023-06-03 – 2023-06-04 (×2): 50 mg via SUBCUTANEOUS
  Filled 2023-06-03 (×2): qty 0.6

## 2023-06-03 NOTE — Plan of Care (Signed)
  Problem: Education: Goal: Knowledge of General Education information will improve Description: Including pain rating scale, medication(s)/side effects and non-pharmacologic comfort measures Outcome: Progressing   Problem: Health Behavior/Discharge Planning: Goal: Ability to manage health-related needs will improve Outcome: Progressing   Problem: Clinical Measurements: Goal: Ability to maintain clinical measurements within normal limits will improve Outcome: Progressing Goal: Will remain free from infection Outcome: Progressing Goal: Diagnostic test results will improve Outcome: Progressing Goal: Respiratory complications will improve Outcome: Progressing Goal: Cardiovascular complication will be avoided Outcome: Progressing   Problem: Pain Management: Goal: General experience of comfort will improve Outcome: Progressing   Problem: Safety: Goal: Ability to remain free from injury will improve Outcome: Progressing   Problem: Skin Integrity: Goal: Risk for impaired skin integrity will decrease Outcome: Progressing

## 2023-06-03 NOTE — TOC Benefit Eligibility Note (Signed)
Patient Product/process development scientist completed.    The patient is insured through [facility]. Patient has Medicare and is not eligible for a copay card, but may be able to apply for patient assistance, if available.    Ran test claim for Entresto 24-26 mg and the current 30 day co-pay is $0.00.  Ran test claim for Farxiga 10 mg and the current 30 day co-pay is $0.00.  Ran test claim for Jardiance 10 mg and the current 30 day co-pay is $0.00.   This test claim was processed through [facility]- copay amounts may vary at other pharmacies due to pharmacy/plan contracts, or as the patient moves through the different stages of their insurance plan.     Roland Earl, CPHT Pharmacy Technician III Certified Patient Advocate [facility] Patient Advocate Team Direct Number: [phone]  Fax: [phone]

## 2023-06-03 NOTE — ED Notes (Signed)
ED TO INPATIENT HANDOFF REPORT  ED Nurse Name and Phone #: Al Corpus, RN [phone]  S Name/Age/Gender Courtney Carrillo 57 y.o. female Room/Bed: ED36A/ED36A  Code Status   Code Status: Full Code  Home/SNF/Other Home Patient oriented to: self, place, time, and situation Is this baseline? Yes   Triage Complete: Triage complete  Chief Complaint Hypoxia [R09.02]  Triage Note Pt to ED [facility] from side of road on the way to doctors office for shob. Was seen at ER yesterday. 1 duoneb PTA. Wheezing noted. No improvement with inhaler.    Allergies Allergies  Allergen Reactions   Lisinopril Cough   Losartan Cough   Other Other (See Comments)    Perfumes : Congestion    Level of Care/Admitting Diagnosis ED Disposition     ED Disposition  Admit   Condition  --   Comment  Hospital Area: [facility] [[id number]]  Level of Care: Telemetry Medical [104]  Covid Evaluation: Asymptomatic - no recent exposure (last 10 days) testing not required  Diagnosis: Hypoxia [300808]  Admitting Physician: Emeline General [[id number]]  Attending Physician: Emeline General [[id number]]          B Medical/Surgery History Past Medical History:  Diagnosis Date   Anemia    2015   Anxiety    Depression    Headache    H/O   Hyperlipidemia    Hypertension    Sleep apnea    CPAP   Past Surgical History:  Procedure Laterality Date   BACK SURGERY     LUMBAR   CARPAL TUNNEL RELEASE Right 06/19/2015   Procedure: CARPAL TUNNEL RELEASE;  Surgeon: Kennedy Bucker, MD;  Location: [facility] ORS;  Service: Orthopedics;  Laterality: Right;   COLONOSCOPY WITH PROPOFOL N/A 01/28/2021   Procedure: COLONOSCOPY WITH PROPOFOL;  Surgeon: Toney Reil, MD;  Location: [facility] ENDOSCOPY;  Service: Gastroenterology;  Laterality: N/A;   DILATION AND CURETTAGE OF UTERUS     FLEXIBLE SIGMOIDOSCOPY N/A 04/10/2021   Procedure: FLEXIBLE SIGMOIDOSCOPY;  Surgeon: Toney Reil, MD;  Location: [facility]  ENDOSCOPY;  Service: Gastroenterology;  Laterality: N/A;     A IV Location/Drains/Wounds Patient Lines/Drains/Airways Status     Active Line/Drains/Airways     Name Placement date Placement time Site Days   Peripheral IV 06/02/23 20 G Right Antecubital 06/02/23  1350  Antecubital  1            Intake/Output Last 24 hours  Intake/Output Summary (Last 24 hours) at 06/03/2023 1412 Last data filed at 06/03/2023 0600 Gross per 24 hour  Intake --  Output 1000 ml  Net -1000 ml    Labs/Imaging Results for orders placed or performed during the hospital encounter of 06/02/23 (from the past 48 hour(s))  CBC     Status: Abnormal   Collection Time: 06/02/23 10:46 AM  Result Value Ref Range   WBC 9.6 4.0 - 10.5 K/uL   RBC 4.92 3.87 - 5.11 MIL/uL   Hemoglobin 11.4 (L) 12.0 - 15.0 g/dL   HCT 95.6 21.3 - 08.6 %   MCV 78.7 (L) 80.0 - 100.0 fL   MCH 23.2 (L) 26.0 - 34.0 pg   MCHC 29.5 (L) 30.0 - 36.0 g/dL   RDW 57.8 (H) 46.9 - 62.9 %   Platelets 468 (H) 150 - 400 K/uL   nRBC 0.0 0.0 - 0.2 %    Comment: Performed at [facility], [address], [place], [place] [address]  Comprehensive metabolic panel  Status: Abnormal   Collection Time: 06/02/23 10:46 AM  Result Value Ref Range   Sodium 141 135 - 145 mmol/L   Potassium 3.7 3.5 - 5.1 mmol/L    Comment: HEMOLYSIS AT THIS LEVEL MAY AFFECT RESULT   Chloride 104 98 - 111 mmol/L   CO2 23 22 - 32 mmol/L   Glucose, Bld 132 (H) 70 - 99 mg/dL    Comment: Glucose reference range applies only to samples taken after fasting for at least 8 hours.   BUN <5 (L) 6 - 20 mg/dL   Creatinine, Ser 4.09 0.44 - 1.00 mg/dL   Calcium 9.1 8.9 - 81.1 mg/dL   Total Protein 7.9 6.5 - 8.1 g/dL   Albumin 4.0 3.5 - 5.0 g/dL   AST 23 15 - 41 U/L    Comment: HEMOLYSIS AT THIS LEVEL MAY AFFECT RESULT   ALT 19 0 - 44 U/L    Comment: HEMOLYSIS AT THIS LEVEL MAY AFFECT RESULT   Alkaline Phosphatase 84 38 - 126 U/L   Total Bilirubin 1.2 0.3 - 1.2  mg/dL    Comment: HEMOLYSIS AT THIS LEVEL MAY AFFECT RESULT   GFR, Estimated >60 >60 mL/min    Comment: (NOTE) Calculated using the CKD-EPI Creatinine Equation (2021)    Anion gap 14 5 - 15    Comment: Performed at [facility], [address] [place], [place] [address]  Lactic acid, plasma     Status: Abnormal   Collection Time: 06/02/23 10:46 AM  Result Value Ref Range   Lactic Acid, Venous 3.2 (HH) 0.5 - 1.9 mmol/L    Comment: CRITICAL RESULT CALLED TO, READ BACK BY AND VERIFIED WITH JANE RYAN 06/02/23 @ 1125 BY SH Performed at [facility], [address] [place], [place] [address]   Brain natriuretic peptide     Status: None   Collection Time: 06/02/23 10:46 AM  Result Value Ref Range   B Natriuretic Peptide 28.0 0.0 - 100.0 pg/mL    Comment: Performed at [facility], [address] [place], [place] [address]  Troponin I (High Sensitivity)     Status: None   Collection Time: 06/02/23  1:50 PM  Result Value Ref Range   Troponin I (High Sensitivity) 5 <18 ng/L    Comment: (NOTE) Elevated high sensitivity troponin I (hsTnI) values and significant  changes across serial measurements may suggest ACS but many other  chronic and acute conditions are known to elevate hsTnI results.  Refer to the "Links" section for chest pain algorithms and additional  guidance. Performed at [facility], [address] [place], [place] [address]   Lactic acid, plasma     Status: Abnormal   Collection Time: 06/02/23  1:55 PM  Result Value Ref Range   Lactic Acid, Venous 2.5 (HH) 0.5 - 1.9 mmol/L    Comment: CRITICAL VALUE NOTED. VALUE IS CONSISTENT WITH PREVIOUSLY REPORTED/CALLED VALUE MU Performed at [facility], [address] [place], [place] [address]   Sedimentation rate     Status: Abnormal   Collection Time: 06/02/23  8:21 PM  Result Value Ref Range   Sed Rate 36 (H) 0 - 30 mm/hr    Comment: Performed at [facility],  [address] [place], [place] [address]  C-reactive protein     Status: Abnormal   Collection Time: 06/02/23  8:21 PM  Result Value Ref Range   CRP 2.9 (H) <1.0 mg/dL    Comment: Performed at [facility], [address] [place], [place] [address]  Procalcitonin     Status: None   Collection Time: 06/02/23  8:21 PM  Result Value Ref Range   Procalcitonin <0.10 ng/mL    Comment:        Interpretation: PCT (Procalcitonin) <= 0.5 ng/mL: Systemic infection (sepsis) is not likely. Local bacterial infection is possible. (NOTE)       Sepsis PCT Algorithm           Lower Respiratory Tract                                      Infection PCT Algorithm    ----------------------------     ----------------------------         PCT < 0.25 ng/mL                PCT < 0.10 ng/mL          Strongly encourage             Strongly discourage   discontinuation of antibiotics    initiation of antibiotics    ----------------------------     -----------------------------       PCT 0.25 - 0.50 ng/mL            PCT 0.10 - 0.25 ng/mL               OR       >80% decrease in PCT            Discourage initiation of                                            antibiotics      Encourage discontinuation           of antibiotics    ----------------------------     -----------------------------         PCT >= 0.50 ng/mL              PCT 0.26 - 0.50 ng/mL               AND        <80% decrease in PCT             Encourage initiation of                                             antibiotics       Encourage continuation           of antibiotics    ----------------------------     -----------------------------        PCT >= 0.50 ng/mL                  PCT > 0.50 ng/mL               AND         increase in PCT                  Strongly encourage  initiation of antibiotics    Strongly encourage escalation           of antibiotics                                      -----------------------------                                           PCT <= 0.25 ng/mL                                                 OR                                        > 80% decrease in PCT                                      Discontinue / Do not initiate                                             antibiotics  Performed at [facility], [address] [place], [place] [address]   Body fluid cell count with differential     Status: Abnormal   Collection Time: 06/03/23 11:35 AM  Result Value Ref Range   Fluid Type-FCT CYTO PLEU    Color, Fluid RED (A) YELLOW   Appearance, Fluid TURBID (A) CLEAR   Total Nucleated Cell Count, Fluid 6,402 cu mm   Neutrophil Count, Fluid 10 %   Lymphs, Fluid 76 %   Monocyte-Macrophage-Serous Fluid 14 %   Eos, Fluid 0 %    Comment: Performed at [facility], [address] [place], [place] [address]  Glucose, pleural or peritoneal fluid     Status: None   Collection Time: 06/03/23 11:35 AM  Result Value Ref Range   Glucose, Fluid 140 mg/dL    Comment: (NOTE) No normal range established for this test Results should be evaluated in conjunction with serum values    Fluid Type-FGLU CYTO PLEU     Comment: Performed at [facility], [address] [place], [place] [address]   CT Angio Chest PE W and/or Wo Contrast  Result Date: 06/02/2023 CLINICAL DATA:  Shortness of breath EXAM: CT ANGIOGRAPHY CHEST WITH CONTRAST TECHNIQUE: Multidetector CT imaging of the chest was performed using the standard protocol during bolus administration of intravenous contrast. Multiplanar CT image reconstructions and MIPs were obtained to evaluate the vascular anatomy. RADIATION DOSE REDUCTION: This exam was performed according to the departmental dose-optimization program which includes automated exposure control, adjustment of the mA and/or kV according to patient size and/or use of iterative reconstruction technique. CONTRAST:   75mL OMNIPAQUE IOHEXOL 350 MG/ML SOLN COMPARISON:  Chest radiographs dated 06/02/2023, 06/01/2023, CT chest dated 05/08/2023 FINDINGS: Cardiovascular: The study is high quality for the evaluation of pulmonary embolism. There are no filling  defects in the central, lobar, segmental or subsegmental pulmonary artery branches to suggest acute pulmonary embolism. Great vessels are normal in course and caliber. Left ventricle appears effaced by the adjacent pleural effusion. No significant pericardial fluid/thickening. Aortic atherosclerosis. Mediastinum/Nodes: Imaged thyroid gland without nodules meeting criteria for imaging follow-up by size. Normal esophagus. No pathologically enlarged axillary, supraclavicular, mediastinal, or hilar lymph nodes. Lungs/Pleura: The central airways are patent. Subsegmental lingular and left lower lobe atelectasis. No focal consolidation. No pneumothorax. Persistent moderate to large left pleural effusion with loculated components. Upper abdomen: 1.2 cm left upper pole hypoattenuating focus (4:151), likely simple cysts. No specific follow-up imaging recommended. Partially imaged lower pole left kidney demonstrates prominent contour anteriorly (4:164). Musculoskeletal: Healing left anterolateral fifth and sixth rib fractures. Review of the MIP images confirms the above findings. IMPRESSION: 1. No evidence of acute pulmonary embolism. 2. Persistent moderate to large left pleural effusion with loculated components. The left ventricle appears effaced by this pleural effusion. 3. Healing left anterolateral fifth and sixth rib fractures. 4. Partially imaged lower pole left kidney demonstrates prominent contour anteriorly. Recommend further evaluation with renal ultrasound. 5.  Aortic Atherosclerosis (ICD10-I70.0). Electronically Signed   By: Agustin Cree M.D.   On: 06/02/2023 17:36   DG Chest 2 View  Result Date: 06/02/2023 CLINICAL DATA:  Shortness of breath. EXAM: CHEST - 2 VIEW COMPARISON:   X-ray 06/01/2023 FINDINGS: Stable moderate left effusion with some adjacent opacity. No pneumothorax or edema. Normal cardiopericardial silhouette. Degenerative changes along the spine. IMPRESSION: Moderate left effusion. Similar to previous. Please correlate with the history Electronically Signed   By: Karen Kays M.D.   On: 06/02/2023 13:41    Pending Labs Unresulted Labs (From admission, onward)     Start     Ordered   06/03/23 1138  Miscellaneous [facility] test (send-out)  RELEASE UPON ORDERING,   TIMED        06/03/23 1138   06/03/23 1138  Body fluid culture w Gram Stain  RELEASE UPON ORDERING,   TIMED        06/03/23 1138            Vitals/Pain Today's Vitals   06/03/23 1300 06/03/23 1327 06/03/23 1330 06/03/23 1400  BP: (!) 143/111  138/78 134/83  Pulse: 74  80 76  Resp: 20  20 13   Temp:      TempSrc:      SpO2: 100%  100% 96%  Weight:      Height:      PainSc:  0-No pain      Isolation Precautions No active isolations  Medications Medications  ipratropium-albuterol (DUONEB) 0.5-2.5 (3) MG/3ML nebulizer solution 3 mL (3 mLs Nebulization Given 06/03/23 1326)  budesonide (PULMICORT) nebulizer solution 0.25 mg (0.25 mg Nebulization Given 06/03/23 0823)  predniSONE (DELTASONE) tablet 40 mg (40 mg Oral Given 06/03/23 0820)  acetaminophen-codeine (TYLENOL #3) 300-30 MG per tablet 1 tablet (1 tablet Oral Given 06/03/23 1251)  ibuprofen (ADVIL) tablet 800 mg (800 mg Oral Given 06/03/23 0830)  amLODipine (NORVASC) tablet 5 mg (5 mg Oral Given 06/03/23 1019)  atorvastatin (LIPITOR) tablet 40 mg (40 mg Oral Given 06/03/23 1019)  busPIRone (BUSPAR) tablet 20 mg (20 mg Oral Given 06/03/23 1018)  doxepin (SINEQUAN) capsule 50 mg (has no administration in time range)  benztropine (COGENTIN) tablet 1 mg (1 mg Oral Given 06/03/23 1019)  methocarbamol (ROBAXIN) tablet 500 mg (has no administration in time range)  albuterol (PROVENTIL) (2.5 MG/3ML) 0.083% nebulizer solution 3 mL (3  mLs Inhalation Given 06/03/23 1256)  ondansetron (ZOFRAN) tablet 4 mg (has no administration in time range)    Or  ondansetron (ZOFRAN) injection 4 mg (has no administration in time range)  carvedilol (COREG) tablet 3.125 mg (3.125 mg Oral Given 06/03/23 0821)  cariprazine (VRAYLAR) capsule 6 mg (6 mg Oral Given 06/03/23 1019)  enoxaparin (LOVENOX) injection 50 mg (has no administration in time range)  iohexol (OMNIPAQUE) 350 MG/ML injection 75 mL (75 mLs Intravenous Contrast Given 06/02/23 1434)  methylPREDNISolone sodium succinate (SOLU-MEDROL) 40 mg/mL injection 40 mg (40 mg Intravenous Given 06/02/23 1624)  guaiFENesin (ROBITUSSIN) 100 MG/5ML liquid 5 mL (5 mLs Oral Given 06/03/23 0558)  lidocaine (PF) (XYLOCAINE) 1 % injection 10 mL (10 mLs Subcutaneous Given 06/03/23 1134)    Mobility walks     Focused Assessments Pulmonary Assessment Handoff:  Lung sounds: Bilateral Breath Sounds: Diminished, Expiratory wheezes L Breath Sounds: Expiratory wheezes R Breath Sounds: Expiratory wheezes O2 Device: Nasal Cannula O2 Flow Rate (L/min): 2 L/min    R Recommendations: See Admitting Provider Note  Report given to:   Additional Notes: pleasant, ambulatory, 2L [place], thoracentesis done, h/o same, "feels better"

## 2023-06-03 NOTE — ED Notes (Signed)
Back from IR, verbalizes "tolerated thoracentesis well, breathing better", resps e/u, no active coughing, alert, NAD, calm, interactive, up to recliner. Denies pain or other sx.

## 2023-06-03 NOTE — Progress Notes (Signed)
Progress Note   Patient: Courtney Carrillo WUJ:[id number] DOB: 04/28/1966 DOA: 06/02/2023     0 DOS: the patient was seen and examined on 06/03/2023   Brief hospital course: LAVASHA STACHE is a 57 y.o. female with medical history significant of recent pneumonia and parapneumonic left-sided pleural effusion, HTN, HLD, sleep apnea on CPAP at bedtime, anxiety depression, presented with worsening of wheezing.   Symptoms started 2 days ago, patient developed wheezing and increasing exertional dyspnea denied any cough no chest pain no fever or chills.  She came to ED yesterday and her x-ray was clear, patient was diagnosed with bronchitis and sent home with albuterol as needed.  Patient has been using the albuterol several times with some help.  Today however patient symptoms became worse and decided come back to the ED again.  3 Das ago, patient was hospitalized for new onset of left-sided pleural effusion and thoracentesis was done which showed parapneumonic pleural effusion secondary to pneumonia.   ED Course: Borderline tachycardia, blood pressure elevated.  O2 saturation 100% on room air.  CTA negative for PE but recurrent moderate to large loculated left-sided pleural effusion.  Lactic acid 3.2, hemoglobin 11.4, K3.7, creatinine 0.8      Assessment and Plan:  Recurrent left-sided pleural effusion -Thoracentesis done 3 Jocelyn ago showed an exudate, compatible with parapneumonic effusion secondary to pneumonia.   Her pneumonia was sufficiently treated and at this time, unclear the reason for recurrent left-sided pleural effusion.   Appreciate pulmonary input, IR consult for thoracentesis Obtain 2D echocardiogram to assess LVEF    Acute bronchitis -Patient quit smoking 1 year ago, appears at baseline she does not have any COPD symptoms. -No previous PFTs.  For now we will treat for acute bronchitis with continue DuoNebs and breathing treatment, Pulmicort and trial of p.o. prednisone. -Hold off  antibiotics    HTN, -Continue amlodipine, add Coreg   Anxiety/depression -Continue SSRI and doxepin   Morbid obesity BMI 39 Complicates overall prognosis and care Lifestyle occasional exercise has been discussed with patient in detail        Subjective: Patient is seen and examined at the bedside.  Physical Exam: Vitals:   06/03/23 1330 06/03/23 1400 06/03/23 1425 06/03/23 1436  BP: 138/78 134/83  (!) 165/96  Pulse: 80 76 76 88  Resp: 20 13 12 18   Temp:    98.3 F (36.8 C)  TempSrc:    Oral  SpO2: 100% 96% 95% 97%  Weight:    104.1 kg  Height:    5' 4.17" (1.63 m)   General Morbidly obese Eyes: PERRL, lids and conjunctivae normal ENMT: Mucous membranes are moist. Posterior pharynx clear of any exudate or lesions.Normal dentition.  Neck: normal, supple, no masses, no thyromegaly Respiratory: Diminished breathing sound bilaterally more on the left side, scattered wheezing, no crackles.  Increasing respiratory effort. No accessory muscle use.  Cardiovascular: Regular rate and rhythm, no murmurs / rubs / gallops. No extremity edema. 2+ pedal pulses. No carotid bruits.  Abdomen: no tenderness, no masses palpated. No hepatosplenomegaly. Bowel sounds positive.  Musculoskeletal: no clubbing / cyanosis. No joint deformity upper and lower extremities. Good ROM, no contractures. Normal muscle tone.  Skin: no rashes, lesions, ulcers. No induration Neurologic: CN 2-12 grossly intact. Sensation intact, DTR normal. Strength 5/5 in all 4.  Psychiatric: Normal judgment and insight. Alert and oriented x 3. Normal mood.       Data Reviewed: Labs reviewed There are no new results to review at  this time.  Family Communication: Plan of care discussed with patient  Disposition: Status is: Inpatient Remains inpatient appropriate because: Needs thoracentesis  Planned Discharge Destination: Home    Time spent: 35 minutes  Author: Lucile Shutters, MD 06/03/2023 4:02 PM  For  on call review [email or web address].

## 2023-06-03 NOTE — Progress Notes (Signed)
*  PRELIMINARY RESULTS* Echocardiogram 2D Echocardiogram has been performed.  Carolyne Fiscal 06/03/2023, 4:02 PM

## 2023-06-03 NOTE — ED Notes (Signed)
Pt provided with po fluids.

## 2023-06-03 NOTE — ED Notes (Signed)
Calmer, breathing easier, less coughing.

## 2023-06-03 NOTE — ED Notes (Signed)
Neb in progress, admitting at [facility] : [facility].

## 2023-06-03 NOTE — ED Notes (Signed)
Report to john, rn

## 2023-06-03 NOTE — Plan of Care (Signed)
  Problem: Education: Goal: Knowledge of General Education information will improve Description: Including pain rating scale, medication(s)/side effects and non-pharmacologic comfort measures 06/03/2023 2322 by Eulis Manly, RN Outcome: Progressing 06/03/2023 2322 by Merleen Milliner A, RN Outcome: Progressing   Problem: Clinical Measurements: Goal: Ability to maintain clinical measurements within normal limits will improve 06/03/2023 2322 by Eulis Manly, RN Outcome: Progressing 06/03/2023 2322 by Eulis Manly, RN Outcome: Progressing Goal: Will remain free from infection 06/03/2023 2322 by Eulis Manly, RN Outcome: Progressing 06/03/2023 2322 by Eulis Manly, RN Outcome: Progressing Goal: Diagnostic test results will improve 06/03/2023 2322 by Eulis Manly, RN Outcome: Progressing 06/03/2023 2322 by Merleen Milliner A, RN Outcome: Progressing Goal: Respiratory complications will improve 06/03/2023 2322 by Eulis Manly, RN Outcome: Progressing 06/03/2023 2322 by Merleen Milliner A, RN Outcome: Progressing Goal: Cardiovascular complication will be avoided 06/03/2023 2322 by Eulis Manly, RN Outcome: Progressing 06/03/2023 2322 by Eulis Manly, RN Outcome: Progressing   Problem: Activity: Goal: Risk for activity intolerance will decrease 06/03/2023 2322 by Eulis Manly, RN Outcome: Progressing 06/03/2023 2322 by Merleen Milliner A, RN Outcome: Progressing   Problem: Elimination: Goal: Will not experience complications related to bowel motility 06/03/2023 2322 by Eulis Manly, RN Outcome: Progressing 06/03/2023 2322 by Eulis Manly, RN Outcome: Progressing Goal: Will not experience complications related to urinary retention 06/03/2023 2322 by Eulis Manly, RN Outcome: Progressing 06/03/2023 2322 by Eulis Manly, RN Outcome: Progressing

## 2023-06-03 NOTE — Procedures (Signed)
PROCEDURE SUMMARY:  Successful image-guided left thoracentesis. Yielded 1 L of bloody pleural fluid. Pt tolerated procedure well. No immediate complications. EBL = trace   Specimen was sent for labs. CXR ordered.  Please see imaging section of Epic for full dictation.  Kennieth Francois PA-C 06/03/2023 12:30 PM

## 2023-06-03 NOTE — ED Notes (Signed)
Pt up to bedside commode independently.   

## 2023-06-03 NOTE — ED Notes (Signed)
Pt with increased cough, requesting breathing "treatment" and cough syrup.

## 2023-06-03 NOTE — Consult Note (Signed)
PULMONOLOGY         Date: 06/03/2023,   MRN# [id number] Courtney Carrillo 29-Jan-1966     AdmissionWeight: 106.6 kg                 CurrentWeight: 106.6 kg  Referring provider: Dr Chipper Herb   CHIEF COMPLAINT:   Recurrent pleural effusion    HISTORY OF PRESENT ILLNESS   This is a 57 year old female with a history of chronic anemia, dyslipidemia, anxiety disorder, depression, recurrent headaches, sleep apnea currently on CPAP who came in for respiratory distress and acute on chronic hypoxemic respiratory failure.  She was recently admitted and was found to have a left-sided pleural effusion.  Approximately 3 Dani ago she did have thoracentesis to drain to the left pleural effusion.  Cultures from pleural fluid studies performed May 13, 2023 showed no growth, and a negative Gram stain.  Pleural fluid studies and cell count showed a bloody tap.  This was a exudative due to elevated LDH with hemolysis present and in the context of diuresis.  PCCM consultation for recurrent left pleural effusion with acute on chronic hypoxemic respiratory failure   PAST MEDICAL HISTORY   Past Medical History:  Diagnosis Date   Anemia    2015   Anxiety    Depression    Headache    H/O   Hyperlipidemia    Hypertension    Sleep apnea    CPAP     SURGICAL HISTORY   Past Surgical History:  Procedure Laterality Date   BACK SURGERY     LUMBAR   CARPAL TUNNEL RELEASE Right 06/19/2015   Procedure: CARPAL TUNNEL RELEASE;  Surgeon: Kennedy Bucker, MD;  Location: [facility] ORS;  Service: Orthopedics;  Laterality: Right;   COLONOSCOPY WITH PROPOFOL N/A 01/28/2021   Procedure: COLONOSCOPY WITH PROPOFOL;  Surgeon: Toney Reil, MD;  Location: [facility] ENDOSCOPY;  Service: Gastroenterology;  Laterality: N/A;   DILATION AND CURETTAGE OF UTERUS     FLEXIBLE SIGMOIDOSCOPY N/A 04/10/2021   Procedure: FLEXIBLE SIGMOIDOSCOPY;  Surgeon: Toney Reil, MD;  Location: [facility] ENDOSCOPY;  Service:  Gastroenterology;  Laterality: N/A;     FAMILY HISTORY   Family History  Problem Relation Age of Onset   Diabetes Mother    Breast cancer Sister      SOCIAL HISTORY   Social History   Tobacco Use   Smoking status: Former    Current packs/day: 0.50    Average packs/day: 0.5 packs/day for 35.1 years (17.6 ttl pk-yrs)    Types: Cigarettes    Start date: 09/12/2021   Smokeless tobacco: Never  Vaping Use   Vaping status: Never Used  Substance Use Topics   Alcohol use: No   Drug use: Yes    Frequency: 7.0 times per week    Types: Marijuana     MEDICATIONS    Home Medication:  Current Outpatient Rx   Order #: [id number] Class: Normal   Order #: [id number] Class: Normal   Order #: [id number] Class: Normal   Order #: [id number] Class: Normal   Order #: [id number] Class: Historical Med   Order #: [id number] Class: Historical Med   Order #: [id number] Class: Historical Med   Order #: [id number] Class: Normal   Order #: [id number] Class: Normal   Order #: [id number] Class: Normal   Order #: [id number] Class: Historical Med   Order #: [id number] Class: Normal    Current Medication:  Current Facility-Administered Medications:    acetaminophen-codeine (TYLENOL #3) 300-30 MG per tablet 1 tablet, 1 tablet, Oral, Q8H PRN,  Emeline General, MD   albuterol (PROVENTIL) (2.5 MG/3ML) 0.083% nebulizer solution 3 mL, 3 mL, Inhalation, Q6H PRN, Mikey College T, MD, 3 mL at 06/03/23 0547   amLODipine (NORVASC) tablet 5 mg, 5 mg, Oral, Daily, Mikey College T, MD   atorvastatin (LIPITOR) tablet 40 mg, 40 mg, Oral, Daily, Mikey College T, MD, 40 mg at 06/02/23 1901   benztropine (COGENTIN) tablet 1 mg, 1 mg, Oral, q morning, Mikey College T, MD   budesonide (PULMICORT) nebulizer solution 0.25 mg, 0.25 mg, Nebulization, BID, Mikey College T, MD, 0.25 mg at 06/03/23 9629   busPIRone (BUSPAR) tablet 20 mg, 20 mg, Oral, BID, Mikey College T, MD, 20 mg at 06/02/23 2107   cariprazine (VRAYLAR) capsule 6 mg, 6 mg, Oral, Daily,  Jaynie Bream, RPH   carvedilol (COREG) tablet 3.125 mg, 3.125 mg, Oral, BID WC, Mikey College T, MD, 3.125 mg at 06/03/23 5284   doxepin (SINEQUAN) capsule 50 mg, 50 mg, Oral, QHS, Zhang, Ilda Foil T, MD   enoxaparin (LOVENOX) injection 52.5 mg, 0.5 mg/kg, Subcutaneous, Q24H, Chipper Herb, Ping T, MD, 52.5 mg at 06/02/23 2108   ibuprofen (ADVIL) tablet 800 mg, 800 mg, Oral, Q8H PRN, Mikey College T, MD, 800 mg at 06/03/23 0830   ipratropium-albuterol (DUONEB) 0.5-2.5 (3) MG/3ML nebulizer solution 3 mL, 3 mL, Nebulization, Q6H, Mikey College T, MD, 3 mL at 06/03/23 1324   methocarbamol (ROBAXIN) tablet 500 mg, 500 mg, Oral, Q8H PRN, Mikey College T, MD   ondansetron [facility]) tablet 4 mg, 4 mg, Oral, Q6H PRN **OR** ondansetron (ZOFRAN) injection 4 mg, 4 mg, Intravenous, Q6H PRN, Mikey College T, MD   predniSONE (DELTASONE) tablet 40 mg, 40 mg, Oral, Q breakfast, Mikey College T, MD, 40 mg at 06/03/23 0820  Current Outpatient Medications:    acetaminophen-codeine (TYLENOL #3) 300-30 MG tablet, Take 1 tablet by mouth every 8 (eight) hours as needed for moderate pain (pain score 4-6)., Disp: 20 tablet, Rfl: 0   albuterol (VENTOLIN HFA) 108 (90 Base) MCG/ACT inhaler, Inhale 2 puffs into the lungs every 6 (six) hours as needed for wheezing or shortness of breath., Disp: 8 g, Rfl: 2   amLODipine (NORVASC) 5 MG tablet, TAKE 1 TABLET(5 MG) BY MOUTH DAILY, Disp: 90 tablet, Rfl: 0   atorvastatin (LIPITOR) 40 MG tablet, TAKE 1 TABLET(40 MG) BY MOUTH EVERY MORNING, Disp: 90 tablet, Rfl: 2   benztropine (COGENTIN) 1 MG tablet, Take 1 mg by mouth every morning., Disp: , Rfl:    busPIRone (BUSPAR) 10 MG tablet, Take 20 mg by mouth 2 (two) times daily., Disp: , Rfl:    doxepin (SINEQUAN) 50 MG capsule, Take 50 mg by mouth at bedtime., Disp: , Rfl:    ibuprofen (ADVIL) 800 MG tablet, Take 1 tablet (800 mg total) by mouth every 8 (eight) hours as needed., Disp: 30 tablet, Rfl: 2   lidocaine (LIDODERM) 5 %, Place 1 patch onto the skin  every 12 (twelve) hours. Remove & Discard patch within 12 hours or as directed by MD, Disp: 10 patch, Rfl: 0   methocarbamol (ROBAXIN) 500 MG tablet, Take 1 tablet (500 mg total) by mouth every 8 (eight) hours as needed for muscle spasms., Disp: 30 tablet, Rfl: 0   VRAYLAR 6 MG CAPS, Take 1 capsule by mouth daily., Disp: , Rfl:    hydrOXYzine (ATARAX) 10 MG tablet, Take 1 tablet (10 mg total) by mouth 3 (three) times daily as needed. (Patient not taking: Reported on 06/02/2023), Disp: 30 tablet,  Rfl: 0    ALLERGIES   Lisinopril, Losartan, and Other     REVIEW OF SYSTEMS    Review of Systems:  Gen:  Denies  fever, sweats, chills weigh loss  HEENT: Denies blurred vision, double vision, ear pain, eye pain, hearing loss, nose bleeds, sore throat Cardiac:  No dizziness, chest pain or heaviness, chest tightness,edema Resp:   reports dyspnea chronically  Gi: Denies swallowing difficulty, stomach pain, nausea or vomiting, diarrhea, constipation, bowel incontinence Gu:  Denies bladder incontinence, burning urine Ext:   Denies Joint pain, stiffness or swelling Skin: Denies  skin rash, easy bruising or bleeding or hives Endoc:  Denies polyuria, polydipsia , polyphagia or weight change Psych:   Denies depression, insomnia or hallucinations   Other:  All other systems negative   VS: BP (!) 162/90   Pulse 75   Temp 98.6 F (37 C) (Oral)   Resp 13   Ht 5' 4.5" (1.638 m)   Wt 106.6 kg   SpO2 98%   BMI 39.71 kg/m      PHYSICAL EXAM    GENERAL:NAD, no fevers, chills, no weakness no fatigue HEAD: Normocephalic, atraumatic.  EYES: Pupils equal, round, reactive to light. Extraocular muscles intact. No scleral icterus.  MOUTH: Moist mucosal membrane. Dentition intact. No abscess noted.  EAR, NOSE, THROAT: Clear without exudates. No external lesions.  NECK: Supple. No thyromegaly. No nodules. No JVD.  PULMONARY: decreased breath sounds with mild rhonchi worse at bases bilaterally.   CARDIOVASCULAR: S1 and S2. Regular rate and rhythm. No murmurs, rubs, or gallops. No edema. Pedal pulses 2+ bilaterally.  GASTROINTESTINAL: Soft, nontender, nondistended. No masses. Positive bowel sounds. No hepatosplenomegaly.  MUSCULOSKELETAL: No swelling, clubbing, or edema. Range of motion full in all extremities.  NEUROLOGIC: Cranial nerves II through XII are intact. No gross focal neurological deficits. Sensation intact. Reflexes intact.  SKIN: No ulceration, lesions, rashes, or cyanosis. Skin warm and dry. Turgor intact.  PSYCHIATRIC: Mood, affect within normal limits. The patient is awake, alert and oriented x 3. Insight, judgment intact.       IMAGING     Narrative & Impression  CLINICAL DATA:  Shortness of breath   EXAM: CT ANGIOGRAPHY CHEST WITH CONTRAST   TECHNIQUE: Multidetector CT imaging of the chest was performed using the standard protocol during bolus administration of intravenous contrast. Multiplanar CT image reconstructions and MIPs were obtained to evaluate the vascular anatomy.   RADIATION DOSE REDUCTION: This exam was performed according to the departmental dose-optimization program which includes automated exposure control, adjustment of the mA and/or kV according to patient size and/or use of iterative reconstruction technique.   CONTRAST:  75mL OMNIPAQUE IOHEXOL 350 MG/ML SOLN   COMPARISON:  Chest radiographs dated 06/02/2023, 06/01/2023, CT chest dated 05/08/2023   FINDINGS: Cardiovascular: The study is high quality for the evaluation of pulmonary embolism. There are no filling defects in the central, lobar, segmental or subsegmental pulmonary artery branches to suggest acute pulmonary embolism. Great vessels are normal in course and caliber. Left ventricle appears effaced by the adjacent pleural effusion. No significant pericardial fluid/thickening. Aortic atherosclerosis.   Mediastinum/Nodes: Imaged thyroid gland without nodules  meeting criteria for imaging follow-up by size. Normal esophagus. No pathologically enlarged axillary, supraclavicular, mediastinal, or hilar lymph nodes.   Lungs/Pleura: The central airways are patent. Subsegmental lingular and left lower lobe atelectasis. No focal consolidation. No pneumothorax. Persistent moderate to large left pleural effusion with loculated components.   Upper abdomen: 1.2 cm left upper pole hypoattenuating  focus (4:151), likely simple cysts. No specific follow-up imaging recommended. Partially imaged lower pole left kidney demonstrates prominent contour anteriorly (4:164).   Musculoskeletal: Healing left anterolateral fifth and sixth rib fractures.   Review of the MIP images confirms the above findings.   IMPRESSION: 1. No evidence of acute pulmonary embolism. 2. Persistent moderate to large left pleural effusion with loculated components. The left ventricle appears effaced by this pleural effusion. 3. Healing left anterolateral fifth and sixth rib fractures. 4. Partially imaged lower pole left kidney demonstrates prominent contour anteriorly. Recommend further evaluation with renal ultrasound. 5.  Aortic Atherosclerosis (ICD10-I70.0).     Electronically Signed   By: Agustin Cree M.D.    ASSESSMENT/PLAN   Recurrent left pleural effusion  - IR consultation - thoracentesis  - fluid studies  - previously thoracentesis 05/13/23 with bloody return -need to clear effusion and re test it -needs echo to rule out chf Will continue to follow    OSA on cpap  -use CPAP at bedtime with RT for settings            Thank you for allowing me to participate in the care of this patient.   Patient/Family are satisfied with care plan and all questions have been answered.    Provider disclosure: Patient with at least one acute or chronic illness or injury that poses a threat to life or bodily function and is being managed actively during this encounter.   All of the below services have been performed independently by signing provider:  review of prior documentation from internal and or external health records.  Review of previous and current lab results.  Interview and comprehensive assessment during patient visit today. Review of current and previous chest radiographs/CT scans. Discussion of management and test interpretation with health care team and patient/family.   This document was prepared using Dragon voice recognition software and may include unintentional dictation errors.     Vida Rigger, M.D.  Division of Pulmonary & Critical Care Medicine

## 2023-06-04 DIAGNOSIS — R0902 Hypoxemia: Secondary | ICD-10-CM | POA: Diagnosis not present

## 2023-06-04 LAB — COMPREHENSIVE METABOLIC PANEL
ALT: 15 U/L (ref 0–44)
AST: 17 U/L (ref 15–41)
Albumin: 3.6 g/dL (ref 3.5–5.0)
Alkaline Phosphatase: 75 U/L (ref 38–126)
Anion gap: 10 (ref 5–15)
BUN: 10 mg/dL (ref 6–20)
CO2: 30 mmol/L (ref 22–32)
Calcium: 9.4 mg/dL (ref 8.9–10.3)
Chloride: 103 mmol/L (ref 98–111)
Creatinine, Ser: 0.85 mg/dL (ref 0.44–1.00)
GFR, Estimated: >60 mL/min (ref >60)
Glucose, Bld: 103 mg/dL — ABNORMAL HIGH (ref 70–99)
Potassium: 3 mmol/L — ABNORMAL LOW (ref 3.5–5.1)
Sodium: 143 mmol/L (ref 135–145)
Total Bilirubin: 0.5 mg/dL (ref 0.3–1.2)
Total Protein: 7.1 g/dL (ref 6.5–8.1)

## 2023-06-04 LAB — CBC
HCT: 34.4 % — ABNORMAL LOW (ref 36.0–46.0)
Hemoglobin: 10.9 g/dL — ABNORMAL LOW (ref 12.0–15.0)
MCH: 24.3 pg — ABNORMAL LOW (ref 26.0–34.0)
MCHC: 31.7 g/dL (ref 30.0–36.0)
MCV: 76.8 fL — ABNORMAL LOW (ref 80.0–100.0)
Platelets: 483 10*3/uL — ABNORMAL HIGH (ref 150–400)
RBC: 4.48 MIL/uL (ref 3.87–5.11)
RDW: 17.5 % — ABNORMAL HIGH (ref 11.5–15.5)
WBC: 15.1 10*3/uL — ABNORMAL HIGH (ref 4.0–10.5)
nRBC: 0 % (ref 0.0–0.2)

## 2023-06-04 LAB — PROTEIN, PLEURAL OR PERITONEAL FLUID: Total protein, fluid: 5.2 g/dL

## 2023-06-04 LAB — ALBUMIN, PLEURAL OR PERITONEAL FLUID: Albumin, Fluid: 3.1 g/dL

## 2023-06-04 NOTE — Progress Notes (Signed)
Progress Note   Patient: Courtney Carrillo:[id number] DOB: 07/04/1966 DOA: 06/02/2023     1 DOS: the patient was seen and examined on 06/04/2023   Brief hospital course:  Courtney Carrillo is a 57 y.o. female with medical history significant of recent pneumonia and parapneumonic left-sided pleural effusion, HTN, HLD, sleep apnea on CPAP at bedtime, anxiety depression, presented with worsening of wheezing.   Symptoms started 2 days ago, patient developed wheezing and increasing exertional dyspnea denied any cough no chest pain no fever or chills.  She came to ED yesterday and her x-ray was clear, patient was diagnosed with bronchitis and sent home with albuterol as needed.  Patient has been using the albuterol several times with some help.  Today however patient symptoms became worse and decided come back to the ED again.  3 Courtney Carrillo ago, patient was hospitalized for new onset of left-sided pleural effusion and thoracentesis was done which showed parapneumonic pleural effusion secondary to pneumonia.   ED Course: Borderline tachycardia, blood pressure elevated.  O2 saturation 100% on room air.  CTA negative for PE but recurrent moderate to large loculated left-sided pleural effusion.  Lactic acid 3.2, hemoglobin 11.4, K3.7, creatinine 0.8     Assessment and Plan:  Recurrent left-sided pleural effusion -Thoracentesis done 3 [place] ago showed an exudate, compatible with parapneumonic effusion secondary to pneumonia.   Her pneumonia was sufficiently treated and at this time, unclear the reason for recurrent left-sided pleural effusion.   Patient is status post thoracentesis with drainage of 1 L of bloody pleural fluid Fluid appears to be an exudate 2D echocardiogram shows a normal LVEF Appreciate pulmonary input     Acute bronchitis Acute respiratory failure -Patient quit smoking 1 year ago, appears at baseline she does not have any COPD symptoms. -No previous PFTs.  For now we will treat for  acute bronchitis with continue DuoNebs and breathing treatment, Pulmicort and trial of p.o. prednisone. -Hold off antibiotics Patient requiring 2 L of oxygen to maintain pulse oximetry greater than 88% She was ambulated in the hall on room air pulse oximetry dropped to less than 88% She will likely require home oxygen upon discharge     HTN, -Continue amlodipine and Coreg   Anxiety/depression -Continue SSRI, Vraylar and doxepin     Morbid obesity BMI 39 Complicates overall prognosis and care Lifestyle occasional exercise has been discussed with patient in detail         Subjective: Patient is seen and examined at the bedside.  Continues to complain of shortness of breath with exertion but improved  Physical Exam: Vitals:   06/04/23 0359 06/04/23 0726 06/04/23 0836 06/04/23 1226  BP: 118/70  (!) 145/61   Pulse: 70  85   Resp: 18  17   Temp: 98.3 F (36.8 C)  98.2 F (36.8 C)   TempSrc: Oral     SpO2: 93% 94% 99% 100%  Weight:      Height:        General Morbidly obese Eyes: PERRL, lids and conjunctivae normal ENMT: Mucous membranes are moist. Posterior pharynx clear of any exudate or lesions.Normal dentition.  Neck: normal, supple, no masses, no thyromegaly Respiratory: Diminished breathing sound bilaterally more on the left side, scattered wheezing, no crackles.  Increasing respiratory effort. No accessory muscle use.  Cardiovascular: Regular rate and rhythm, no murmurs / rubs / gallops. No extremity edema. 2+ pedal pulses. No carotid bruits.  Abdomen: no tenderness, no masses palpated. No hepatosplenomegaly. Bowel sounds  positive.  Musculoskeletal: no clubbing / cyanosis. No joint deformity upper and lower extremities. Good ROM, no contractures. Normal muscle tone.  Skin: no rashes, lesions, ulcers. No induration Neurologic: CN 2-12 grossly intact. Sensation intact, DTR normal. Strength 5/5 in all 4.  Psychiatric: Normal judgment and insight. Alert and oriented x  3. Normal mood.       Data Reviewed: Pleural fluid culture no growth in less than 24 hours There are no new results to review at this time.  Family Communication: Plan of care discussed with patient in detail.  She verbalizes understanding and agrees with the plan  Disposition: Status is: Inpatient Remains inpatient appropriate because: Symptom control  Planned Discharge Destination: Home    Time spent: 33 minutes  Author: Lucile Shutters, MD 06/04/2023 3:07 PM  For on call review [email or web address].

## 2023-06-04 NOTE — Progress Notes (Signed)
Transition of Care [facility]) - Inpatient Brief Assessment   Patient Details  Name: Courtney Carrillo MRN: [id number] Date of Birth: [birth year]  [facility] CM/SW Contact:    Truddie Hidden, RN Phone Number: 06/04/2023, 1:49 PM   Clinical Narrative: TOC continuing to follow patient's progress throughout discharge planning.   Transition of Care Asessment: Insurance and Status: Insurance coverage has been reviewed Patient has primary care physician: Yes Home environment has been reviewed: home Prior level of function:: independent Prior/Current Home Services: No current home services Social Determinants of Health Reivew: SDOH reviewed no interventions necessary Readmission risk has been reviewed: Yes Transition of care needs: no transition of care needs at this time

## 2023-06-04 NOTE — Progress Notes (Signed)
PULMONOLOGY         Date: 06/04/2023,   MRN# [id number] Courtney Carrillo October 05, 1965     AdmissionWeight: 106.6 kg                 CurrentWeight: 104.1 kg  Referring provider: Dr Chipper Herb   CHIEF COMPLAINT:   Recurrent pleural effusion    HISTORY OF PRESENT ILLNESS   This is a 57 year old female with a history of chronic anemia, dyslipidemia, anxiety disorder, depression, recurrent headaches, sleep apnea currently on CPAP who came in for respiratory distress and acute on chronic hypoxemic respiratory failure.  She was recently admitted and was found to have a left-sided pleural effusion.  Approximately 3 Goin ago she did have thoracentesis to drain to the left pleural effusion.  Cultures from pleural fluid studies performed May 13, 2023 showed no growth, and a negative Gram stain.  Pleural fluid studies and cell count showed a bloody tap.  This was a exudative due to elevated LDH with hemolysis present and in the context of diuresis.  PCCM consultation for recurrent left pleural effusion with acute on chronic hypoxemic respiratory failure  06/04/23- patient evaluted at bedside, down to 1L/min [place].  She had thoracentesis with mononuclear predominant non infected exudate likely from previous traumatic tap.   PAST MEDICAL HISTORY   Past Medical History:  Diagnosis Date   Anemia    2015   Anxiety    Depression    Headache    H/O   Hyperlipidemia    Hypertension    Sleep apnea    CPAP     SURGICAL HISTORY   Past Surgical History:  Procedure Laterality Date   BACK SURGERY     LUMBAR   CARPAL TUNNEL RELEASE Right 06/19/2015   Procedure: CARPAL TUNNEL RELEASE;  Surgeon: Kennedy Bucker, MD;  Location: [facility] ORS;  Service: Orthopedics;  Laterality: Right;   COLONOSCOPY WITH PROPOFOL N/A 01/28/2021   Procedure: COLONOSCOPY WITH PROPOFOL;  Surgeon: Toney Reil, MD;  Location: [facility] ENDOSCOPY;  Service: Gastroenterology;  Laterality: N/A;   DILATION AND CURETTAGE OF  UTERUS     FLEXIBLE SIGMOIDOSCOPY N/A 04/10/2021   Procedure: FLEXIBLE SIGMOIDOSCOPY;  Surgeon: Toney Reil, MD;  Location: [facility] ENDOSCOPY;  Service: Gastroenterology;  Laterality: N/A;     FAMILY HISTORY   Family History  Problem Relation Age of Onset   Diabetes Mother    Breast cancer Sister      SOCIAL HISTORY   Social History   Tobacco Use   Smoking status: Former    Current packs/day: 0.50    Average packs/day: 0.5 packs/day for 35.1 years (17.6 ttl pk-yrs)    Types: Cigarettes    Start date: 09/12/2021   Smokeless tobacco: Never  Vaping Use   Vaping status: Never Used  Substance Use Topics   Alcohol use: No   Drug use: Yes    Frequency: 7.0 times per week    Types: Marijuana     MEDICATIONS    Home Medication:     Current Medication:  Current Facility-Administered Medications:    acetaminophen-codeine (TYLENOL #3) 300-30 MG per tablet 1 tablet, 1 tablet, Oral, Q8H PRN, Mikey College T, MD, 1 tablet at 06/03/23 1251   albuterol (PROVENTIL) (2.5 MG/3ML) 0.083% nebulizer solution 3 mL, 3 mL, Inhalation, Q6H PRN, Mikey College T, MD, 3 mL at 06/04/23 1226   amLODipine (NORVASC) tablet 5 mg, 5 mg, Oral, Daily, Mikey College T, MD, 5 mg at 06/04/23 [id number]  atorvastatin (LIPITOR) tablet 40 mg, 40 mg, Oral, Daily, Mikey College T, MD, 40 mg at 06/04/23 7829   benztropine (COGENTIN) tablet 1 mg, 1 mg, Oral, q morning, Mikey College T, MD, 1 mg at 06/04/23 0907   budesonide (PULMICORT) nebulizer solution 0.25 mg, 0.25 mg, Nebulization, BID, Mikey College T, MD, 0.25 mg at 06/04/23 0726   busPIRone (BUSPAR) tablet 20 mg, 20 mg, Oral, BID, Mikey College T, MD, 20 mg at 06/04/23 5621   cariprazine (VRAYLAR) capsule 6 mg, 6 mg, Oral, Daily, Jaynie Bream, RPH, 6 mg at 06/04/23 3086   carvedilol (COREG) tablet 3.125 mg, 3.125 mg, Oral, BID WC, Mikey College T, MD, 3.125 mg at 06/04/23 5784   doxepin (SINEQUAN) capsule 50 mg, 50 mg, Oral, QHS, Mikey College T, MD, 50 mg at 06/03/23  2031   enoxaparin (LOVENOX) injection 50 mg, 50 mg, Subcutaneous, Q24H, Ronnald Ramp, RPH, 50 mg at 06/03/23 2030   feeding supplement (ENSURE ENLIVE / ENSURE PLUS) liquid 237 mL, 237 mL, Oral, BID BM, Agbata, Tochukwu, MD, 237 mL at 06/04/23 0908   ibuprofen (ADVIL) tablet 800 mg, 800 mg, Oral, Q8H PRN, Mikey College T, MD, 800 mg at 06/03/23 0830   ipratropium-albuterol (DUONEB) 0.5-2.5 (3) MG/3ML nebulizer solution 3 mL, 3 mL, Nebulization, BID, Agbata, Tochukwu, MD, 3 mL at 06/04/23 0726   methocarbamol (ROBAXIN) tablet 500 mg, 500 mg, Oral, Q8H PRN, Mikey College T, MD   ondansetron [facility]) tablet 4 mg, 4 mg, Oral, Q6H PRN **OR** ondansetron (ZOFRAN) injection 4 mg, 4 mg, Intravenous, Q6H PRN, Mikey College T, MD   predniSONE (DELTASONE) tablet 40 mg, 40 mg, Oral, Q breakfast, Chipper Herb, Ping T, MD, 40 mg at 06/04/23 0908    ALLERGIES   Lisinopril, Losartan, and Other     REVIEW OF SYSTEMS    Review of Systems:  Gen:  Denies  fever, sweats, chills weigh loss  HEENT: Denies blurred vision, double vision, ear pain, eye pain, hearing loss, nose bleeds, sore throat Cardiac:  No dizziness, chest pain or heaviness, chest tightness,edema Resp:   reports dyspnea chronically  Gi: Denies swallowing difficulty, stomach pain, nausea or vomiting, diarrhea, constipation, bowel incontinence Gu:  Denies bladder incontinence, burning urine Ext:   Denies Joint pain, stiffness or swelling Skin: Denies  skin rash, easy bruising or bleeding or hives Endoc:  Denies polyuria, polydipsia , polyphagia or weight change Psych:   Denies depression, insomnia or hallucinations   Other:  All other systems negative   VS: BP (!) 145/61 (BP Location: Left Arm)   Pulse 85   Temp 98.2 F (36.8 C)   Resp 17   Ht 5' 4.17" (1.63 m)   Wt 104.1 kg   SpO2 100%   BMI 39.18 kg/m      PHYSICAL EXAM    GENERAL:NAD, no fevers, chills, no weakness no fatigue HEAD: Normocephalic, atraumatic.  EYES: Pupils  equal, round, reactive to light. Extraocular muscles intact. No scleral icterus.  MOUTH: Moist mucosal membrane. Dentition intact. No abscess noted.  EAR, NOSE, THROAT: Clear without exudates. No external lesions.  NECK: Supple. No thyromegaly. No nodules. No JVD.  PULMONARY: decreased breath sounds with mild rhonchi worse at bases bilaterally.  CARDIOVASCULAR: S1 and S2. Regular rate and rhythm. No murmurs, rubs, or gallops. No edema. Pedal pulses 2+ bilaterally.  GASTROINTESTINAL: Soft, nontender, nondistended. No masses. Positive bowel sounds. No hepatosplenomegaly.  MUSCULOSKELETAL: No swelling, clubbing, or edema. Range of motion full in all extremities.  NEUROLOGIC: Cranial  nerves II through XII are intact. No gross focal neurological deficits. Sensation intact. Reflexes intact.  SKIN: No ulceration, lesions, rashes, or cyanosis. Skin warm and dry. Turgor intact.  PSYCHIATRIC: Mood, affect within normal limits. The patient is awake, alert and oriented x 3. Insight, judgment intact.       IMAGING     Narrative & Impression  CLINICAL DATA:  Shortness of breath   EXAM: CT ANGIOGRAPHY CHEST WITH CONTRAST   TECHNIQUE: Multidetector CT imaging of the chest was performed using the standard protocol during bolus administration of intravenous contrast. Multiplanar CT image reconstructions and MIPs were obtained to evaluate the vascular anatomy.   RADIATION DOSE REDUCTION: This exam was performed according to the departmental dose-optimization program which includes automated exposure control, adjustment of the mA and/or kV according to patient size and/or use of iterative reconstruction technique.   CONTRAST:  75mL OMNIPAQUE IOHEXOL 350 MG/ML SOLN   COMPARISON:  Chest radiographs dated 06/02/2023, 06/01/2023, CT chest dated 05/08/2023   FINDINGS: Cardiovascular: The study is high quality for the evaluation of pulmonary embolism. There are no filling defects in the  central, lobar, segmental or subsegmental pulmonary artery branches to suggest acute pulmonary embolism. Great vessels are normal in course and caliber. Left ventricle appears effaced by the adjacent pleural effusion. No significant pericardial fluid/thickening. Aortic atherosclerosis.   Mediastinum/Nodes: Imaged thyroid gland without nodules meeting criteria for imaging follow-up by size. Normal esophagus. No pathologically enlarged axillary, supraclavicular, mediastinal, or hilar lymph nodes.   Lungs/Pleura: The central airways are patent. Subsegmental lingular and left lower lobe atelectasis. No focal consolidation. No pneumothorax. Persistent moderate to large left pleural effusion with loculated components.   Upper abdomen: 1.2 cm left upper pole hypoattenuating focus (4:151), likely simple cysts. No specific follow-up imaging recommended. Partially imaged lower pole left kidney demonstrates prominent contour anteriorly (4:164).   Musculoskeletal: Healing left anterolateral fifth and sixth rib fractures.   Review of the MIP images confirms the above findings.   IMPRESSION: 1. No evidence of acute pulmonary embolism. 2. Persistent moderate to large left pleural effusion with loculated components. The left ventricle appears effaced by this pleural effusion. 3. Healing left anterolateral fifth and sixth rib fractures. 4. Partially imaged lower pole left kidney demonstrates prominent contour anteriorly. Recommend further evaluation with renal ultrasound. 5.  Aortic Atherosclerosis (ICD10-I70.0).     Electronically Signed   By: Agustin Cree M.D.    ASSESSMENT/PLAN   Recurrent left pleural effusion  - IR consultation - thoracentesis  - fluid studies  - previously thoracentesis 05/13/23 with bloody return -need to clear effusion and re test it -needs echo to rule out chf Will continue to follow    OSA on cpap  -use CPAP at bedtime with RT for settings             Thank you for allowing me to participate in the care of this patient.   Patient/Family are satisfied with care plan and all questions have been answered.    Provider disclosure: Patient with at least one acute or chronic illness or injury that poses a threat to life or bodily function and is being managed actively during this encounter.  All of the below services have been performed independently by signing provider:  review of prior documentation from internal and or external health records.  Review of previous and current lab results.  Interview and comprehensive assessment during patient visit today. Review of current and previous chest radiographs/CT scans. Discussion of management and  test interpretation with health care team and patient/family.   This document was prepared using Dragon voice recognition software and may include unintentional dictation errors.     Vida Rigger, M.D.  Division of Pulmonary & Critical Care Medicine

## 2023-06-05 DIAGNOSIS — J9601 Acute respiratory failure with hypoxia: Secondary | ICD-10-CM

## 2023-06-05 LAB — CBC
HCT: 34 % — ABNORMAL LOW (ref 36.0–46.0)
Hemoglobin: 10.6 g/dL — ABNORMAL LOW (ref 12.0–15.0)
MCH: 23.7 pg — ABNORMAL LOW (ref 26.0–34.0)
MCHC: 31.2 g/dL (ref 30.0–36.0)
MCV: 75.9 fL — ABNORMAL LOW (ref 80.0–100.0)
Platelets: 488 10*3/uL — ABNORMAL HIGH (ref 150–400)
RBC: 4.48 MIL/uL (ref 3.87–5.11)
RDW: 17.5 % — ABNORMAL HIGH (ref 11.5–15.5)
WBC: 17.7 10*3/uL — ABNORMAL HIGH (ref 4.0–10.5)
nRBC: 0 % (ref 0.0–0.2)

## 2023-06-05 LAB — BASIC METABOLIC PANEL
Anion gap: 9 (ref 5–15)
BUN: 14 mg/dL (ref 6–20)
CO2: 31 mmol/L (ref 22–32)
Calcium: 8.9 mg/dL (ref 8.9–10.3)
Chloride: 101 mmol/L (ref 98–111)
Creatinine, Ser: 0.91 mg/dL (ref 0.44–1.00)
GFR, Estimated: >60 mL/min (ref >60)
Glucose, Bld: 98 mg/dL (ref 70–99)
Potassium: 3 mmol/L — ABNORMAL LOW (ref 3.5–5.1)
Sodium: 141 mmol/L (ref 135–145)

## 2023-06-05 LAB — CYTOLOGY - NON PAP

## 2023-06-05 MED ORDER — POTASSIUM CHLORIDE CRYS ER 20 MEQ PO TBCR
40.0000 meq | EXTENDED_RELEASE_TABLET | Freq: Once | ORAL | Status: AC
  Administered 2023-06-05: 40 meq via ORAL
  Filled 2023-06-05: qty 2

## 2023-06-05 MED ORDER — AMLODIPINE BESYLATE 10 MG PO TABS: 10.0000 mg | ORAL_TABLET | Freq: Every day | ORAL | 11 refills | Status: DC

## 2023-06-05 MED ORDER — CARVEDILOL 3.125 MG PO TABS: 3.1250 mg | ORAL_TABLET | Freq: Two times a day (BID) | ORAL | 0 refills | Status: DC

## 2023-06-05 MED ORDER — BUDESONIDE-FORMOTEROL FUMARATE 160-4.5 MCG/ACT IN AERO
2.0000 | INHALATION_SPRAY | Freq: Two times a day (BID) | RESPIRATORY_TRACT | 12 refills | Status: DC
Start: 2023-06-05 — End: 2024-02-29

## 2023-06-05 NOTE — Plan of Care (Signed)
Adequate for discharge.

## 2023-06-05 NOTE — Care Management Important Message (Signed)
Important Message  Patient Details  Name: Courtney Carrillo MRN: [id number] Date of Birth: August 08, 1966   Important Message Given:  Yes - Medicare IM     Truddie Hidden, RN 06/05/2023, 10:42 AM

## 2023-06-05 NOTE — Progress Notes (Signed)
Patient's ride here, [facility] the [facility] mall exit by volunteers via wheel chair for discharge in stable condition.  Cornell Barman Ricco Dershem

## 2023-06-05 NOTE — Progress Notes (Signed)
PULMONOLOGY         Date: 06/05/2023,   MRN# [id number] Courtney Carrillo 1966/06/16     AdmissionWeight: 106.6 kg                 CurrentWeight: 104.1 kg  Referring provider: Dr Chipper Herb   CHIEF COMPLAINT:   Recurrent pleural effusion    HISTORY OF PRESENT ILLNESS   This is a 57 year old female with a history of chronic anemia, dyslipidemia, anxiety disorder, depression, recurrent headaches, sleep apnea currently on CPAP who came in for respiratory distress and acute on chronic hypoxemic respiratory failure.  She was recently admitted and was found to have a left-sided pleural effusion.  Approximately 3 [name] ago she did have thoracentesis to drain to the left pleural effusion.  Cultures from pleural fluid studies performed May 13, 2023 showed no growth, and a negative Gram stain.  Pleural fluid studies and cell count showed a bloody tap.  This was a exudative due to elevated LDH with hemolysis present and in the context of diuresis.  PCCM consultation for recurrent left pleural effusion with acute on chronic hypoxemic respiratory failure  06/04/23- patient seen at bedside.  She is better but still needs some O2.  We will follow up in clinic on outpatient.  She is cleared for dc home / PAST MEDICAL HISTORY   Past Medical History:  Diagnosis Date   Anemia    2015   Anxiety    Depression    Headache    H/O   Hyperlipidemia    Hypertension    Sleep apnea    CPAP     SURGICAL HISTORY   Past Surgical History:  Procedure Laterality Date   BACK SURGERY     LUMBAR   CARPAL TUNNEL RELEASE Right 06/19/2015   Procedure: CARPAL TUNNEL RELEASE;  Surgeon: Kennedy Bucker, MD;  Location: [facility] ORS;  Service: Orthopedics;  Laterality: Right;   COLONOSCOPY WITH PROPOFOL N/A 01/28/2021   Procedure: COLONOSCOPY WITH PROPOFOL;  Surgeon: Toney Reil, MD;  Location: [facility] ENDOSCOPY;  Service: Gastroenterology;  Laterality: N/A;   DILATION AND CURETTAGE OF UTERUS     FLEXIBLE  SIGMOIDOSCOPY N/A 04/10/2021   Procedure: FLEXIBLE SIGMOIDOSCOPY;  Surgeon: Toney Reil, MD;  Location: [facility] ENDOSCOPY;  Service: Gastroenterology;  Laterality: N/A;     FAMILY HISTORY   Family History  Problem Relation Age of Onset   Diabetes Mother    Breast cancer Sister      SOCIAL HISTORY   Social History   Tobacco Use   Smoking status: Former    Current packs/day: 0.50    Average packs/day: 0.5 packs/day for 35.1 years (17.6 ttl pk-yrs)    Types: Cigarettes    Start date: 09/12/2021   Smokeless tobacco: Never  Vaping Use   Vaping status: Never Used  Substance Use Topics   Alcohol use: No   Drug use: Yes    Frequency: 7.0 times per week    Types: Marijuana     MEDICATIONS    Home Medication:     Current Medication:  Current Facility-Administered Medications:    acetaminophen-codeine (TYLENOL #3) 300-30 MG per tablet 1 tablet, 1 tablet, Oral, Q8H PRN, Mikey College T, MD, 1 tablet at 06/03/23 1251   albuterol (PROVENTIL) (2.5 MG/3ML) 0.083% nebulizer solution 3 mL, 3 mL, Inhalation, Q6H PRN, Mikey College T, MD, 3 mL at 06/05/23 0627   amLODipine (NORVASC) tablet 5 mg, 5 mg, Oral, Daily, Emeline General, MD,  5 mg at 06/05/23 0849   atorvastatin (LIPITOR) tablet 40 mg, 40 mg, Oral, Daily, Mikey College T, MD, 40 mg at 06/05/23 0847   benztropine (COGENTIN) tablet 1 mg, 1 mg, Oral, q morning, Mikey College T, MD, 1 mg at 06/05/23 0848   budesonide (PULMICORT) nebulizer solution 0.25 mg, 0.25 mg, Nebulization, BID, Mikey College T, MD, 0.25 mg at 06/05/23 0800   busPIRone (BUSPAR) tablet 20 mg, 20 mg, Oral, BID, Mikey College T, MD, 20 mg at 06/05/23 0849   cariprazine (VRAYLAR) capsule 6 mg, 6 mg, Oral, Daily, Jaynie Bream, RPH, 6 mg at 06/05/23 0847   carvedilol (COREG) tablet 3.125 mg, 3.125 mg, Oral, BID WC, Mikey College T, MD, 3.125 mg at 06/05/23 0849   doxepin (SINEQUAN) capsule 50 mg, 50 mg, Oral, QHS, Mikey College T, MD, 50 mg at 06/04/23 2058   enoxaparin  (LOVENOX) injection 50 mg, 50 mg, Subcutaneous, Q24H, Ronnald Ramp, RPH, 50 mg at 06/04/23 2059   feeding supplement (ENSURE ENLIVE / ENSURE PLUS) liquid 237 mL, 237 mL, Oral, BID BM, Agbata, Tochukwu, MD, 237 mL at 06/04/23 0908   ibuprofen (ADVIL) tablet 800 mg, 800 mg, Oral, Q8H PRN, Mikey College T, MD, 800 mg at 06/03/23 0830   ipratropium-albuterol (DUONEB) 0.5-2.5 (3) MG/3ML nebulizer solution 3 mL, 3 mL, Nebulization, BID, Agbata, Tochukwu, MD, 3 mL at 06/05/23 0800   methocarbamol (ROBAXIN) tablet 500 mg, 500 mg, Oral, Q8H PRN, Mikey College T, MD   ondansetron [facility]) tablet 4 mg, 4 mg, Oral, Q6H PRN **OR** ondansetron (ZOFRAN) injection 4 mg, 4 mg, Intravenous, Q6H PRN, Mikey College T, MD   predniSONE (DELTASONE) tablet 40 mg, 40 mg, Oral, Q breakfast, Mikey College T, MD, 40 mg at 06/05/23 9629    ALLERGIES   Lisinopril, Losartan, and Other     REVIEW OF SYSTEMS    Review of Systems:  Gen:  Denies  fever, sweats, chills weigh loss  HEENT: Denies blurred vision, double vision, ear pain, eye pain, hearing loss, nose bleeds, sore throat Cardiac:  No dizziness, chest pain or heaviness, chest tightness,edema Resp:   reports dyspnea chronically  Gi: Denies swallowing difficulty, stomach pain, nausea or vomiting, diarrhea, constipation, bowel incontinence Gu:  Denies bladder incontinence, burning urine Ext:   Denies Joint pain, stiffness or swelling Skin: Denies  skin rash, easy bruising or bleeding or hives Endoc:  Denies polyuria, polydipsia , polyphagia or weight change Psych:   Denies depression, insomnia or hallucinations   Other:  All other systems negative   VS: BP (!) 165/102 (BP Location: Left Arm)   Pulse 85   Temp 98.3 F (36.8 C) (Oral)   Resp 17   Ht 5' 4.17" (1.63 m)   Wt 104.1 kg   SpO2 96%   BMI 39.18 kg/m      PHYSICAL EXAM    GENERAL:NAD, no fevers, chills, no weakness no fatigue HEAD: Normocephalic, atraumatic.  EYES: Pupils equal, round,  reactive to light. Extraocular muscles intact. No scleral icterus.  MOUTH: Moist mucosal membrane. Dentition intact. No abscess noted.  EAR, NOSE, THROAT: Clear without exudates. No external lesions.  NECK: Supple. No thyromegaly. No nodules. No JVD.  PULMONARY: decreased breath sounds with mild rhonchi worse at bases bilaterally.  CARDIOVASCULAR: S1 and S2. Regular rate and rhythm. No murmurs, rubs, or gallops. No edema. Pedal pulses 2+ bilaterally.  GASTROINTESTINAL: Soft, nontender, nondistended. No masses. Positive bowel sounds. No hepatosplenomegaly.  MUSCULOSKELETAL: No swelling, clubbing, or edema. Range of  motion full in all extremities.  NEUROLOGIC: Cranial nerves II through XII are intact. No gross focal neurological deficits. Sensation intact. Reflexes intact.  SKIN: No ulceration, lesions, rashes, or cyanosis. Skin warm and dry. Turgor intact.  PSYCHIATRIC: Mood, affect within normal limits. The patient is awake, alert and oriented x 3. Insight, judgment intact.       IMAGING     Narrative & Impression  CLINICAL DATA:  Shortness of breath   EXAM: CT ANGIOGRAPHY CHEST WITH CONTRAST   TECHNIQUE: Multidetector CT imaging of the chest was performed using the standard protocol during bolus administration of intravenous contrast. Multiplanar CT image reconstructions and MIPs were obtained to evaluate the vascular anatomy.   RADIATION DOSE REDUCTION: This exam was performed according to the departmental dose-optimization program which includes automated exposure control, adjustment of the mA and/or kV according to patient size and/or use of iterative reconstruction technique.   CONTRAST:  75mL OMNIPAQUE IOHEXOL 350 MG/ML SOLN   COMPARISON:  Chest radiographs dated 06/02/2023, 06/01/2023, CT chest dated 05/08/2023   FINDINGS: Cardiovascular: The study is high quality for the evaluation of pulmonary embolism. There are no filling defects in the central, lobar, segmental  or subsegmental pulmonary artery branches to suggest acute pulmonary embolism. Great vessels are normal in course and caliber. Left ventricle appears effaced by the adjacent pleural effusion. No significant pericardial fluid/thickening. Aortic atherosclerosis.   Mediastinum/Nodes: Imaged thyroid gland without nodules meeting criteria for imaging follow-up by size. Normal esophagus. No pathologically enlarged axillary, supraclavicular, mediastinal, or hilar lymph nodes.   Lungs/Pleura: The central airways are patent. Subsegmental lingular and left lower lobe atelectasis. No focal consolidation. No pneumothorax. Persistent moderate to large left pleural effusion with loculated components.   Upper abdomen: 1.2 cm left upper pole hypoattenuating focus (4:151), likely simple cysts. No specific follow-up imaging recommended. Partially imaged lower pole left kidney demonstrates prominent contour anteriorly (4:164).   Musculoskeletal: Healing left anterolateral fifth and sixth rib fractures.   Review of the MIP images confirms the above findings.   IMPRESSION: 1. No evidence of acute pulmonary embolism. 2. Persistent moderate to large left pleural effusion with loculated components. The left ventricle appears effaced by this pleural effusion. 3. Healing left anterolateral fifth and sixth rib fractures. 4. Partially imaged lower pole left kidney demonstrates prominent contour anteriorly. Recommend further evaluation with renal ultrasound. 5.  Aortic Atherosclerosis (ICD10-I70.0).     Electronically Signed   By: Agustin Cree M.D.    ASSESSMENT/PLAN   Recurrent left pleural effusion  - IR consultation - thoracentesis  - fluid studies  - previously thoracentesis 05/13/23 with bloody return -need to clear effusion and re test it -needs echo to rule out chf Will continue to follow    OSA on cpap  -use CPAP at bedtime with RT for settings            Thank you for  allowing me to participate in the care of this patient.   Patient/Family are satisfied with care plan and all questions have been answered.    Provider disclosure: Patient with at least one acute or chronic illness or injury that poses a threat to life or bodily function and is being managed actively during this encounter.  All of the below services have been performed independently by signing provider:  review of prior documentation from internal and or external health records.  Review of previous and current lab results.  Interview and comprehensive assessment during patient visit today. Review of current and  previous chest radiographs/CT scans. Discussion of management and test interpretation with health care team and patient/family.   This document was prepared using Dragon voice recognition software and may include unintentional dictation errors.     Vida Rigger, M.D.  Division of Pulmonary & Critical Care Medicine

## 2023-06-05 NOTE — TOC Transition Note (Addendum)
Transition of Care [facility]) - CM/SW Discharge Note   Patient Details  Name: Courtney Carrillo MRN: [id number] Date of Birth: 05-01-1966  Transition of Care [facility]) CM/SW Contact:  Truddie Hidden, RN Phone Number: 06/05/2023, 10:39 AM   Clinical Narrative:    Patient discharging home. New home oxygen request sent to [facility] from [employer].   Patient advised oxygen has been requested by [employer] and will be delivered to her room. RNCM advised [employer] will contact her directly to schedule an appointment to come out today to set up the remainder of her oxygen and supplies. Her daughter will come to transport her home.           Patient Goals and CMS Choice      Discharge Placement                         Discharge Plan and Services Additional resources added to the After Visit Summary for                                       Social Determinants of Health (SDOH) Interventions SDOH Screenings   Food Insecurity: No Food Insecurity (06/03/2023)  Housing: Low Risk  (06/03/2023)  Transportation Needs: No Transportation Needs (06/03/2023)  Utilities: Not At Risk (06/03/2023)  Alcohol Screen: Low Risk  (01/28/2023)  Depression (PHQ2-9): High Risk (01/28/2023)  Financial Resource Strain: Low Risk  (12/25/2022)  Physical Activity: Inactive (12/25/2022)  Social Connections: Unknown (12/25/2022)  Stress: No Stress Concern Present (12/25/2022)  Tobacco Use: Medium Risk (06/02/2023)     Readmission Risk Interventions     No data to display

## 2023-06-05 NOTE — Progress Notes (Signed)
IV removed. Discharge education completed. Patient waiting on ride.  Cornell Barman Akeel Reffner

## 2023-06-05 NOTE — Discharge Summary (Signed)
Physician Discharge Summary   Patient: Courtney Carrillo MRN: [id number] DOB: 1965-09-05  Admit date:     06/02/2023  Discharge date: 06/05/23  Discharge Physician: Khloi Rawl   PCP: Lorre Munroe, NP   Recommendations at discharge:   Follow-up with pulmonary as an outpatient  Discharge Diagnoses: Principal Problem:   Acute hypoxic respiratory failure (HCC) Active Problems:   Recurrent left pleural effusion   Obesity, Class III, BMI 40-49.9 (morbid obesity) (HCC)   OSA (obstructive sleep apnea)   Anxiety and depression   Hypokalemia   Bronchitis  Resolved Problems:   * No resolved hospital problems. *  Hospital Course:  Courtney Carrillo is a 57 y.o. female with medical history significant of recent pneumonia and parapneumonic left-sided pleural effusion, HTN, HLD, sleep apnea on CPAP at bedtime, anxiety depression, presented with worsening of wheezing.   Symptoms started 2 days ago, patient developed wheezing and increasing exertional dyspnea denied any cough no chest pain no fever or chills.  She came to ED yesterday and her x-ray was clear, patient was diagnosed with bronchitis and sent home with albuterol as needed.  Patient has been using the albuterol several times with some help.  Today however patient symptoms became worse and decided come back to the ED again.  3 [place] ago, patient was hospitalized for new onset of left-sided pleural effusion and thoracentesis was done which showed parapneumonic pleural effusion secondary to pneumonia.   ED Course: Borderline tachycardia, blood pressure elevated.  O2 saturation 100% on room air.  CTA negative for PE but recurrent moderate to large loculated left-sided pleural effusion.  Lactic acid 3.2, hemoglobin 11.4, K3.7, creatinine 0.8  Assessment and Plan:  Recurrent left-sided pleural effusion -Thoracentesis done 3 [place] ago showed an exudate, compatible with parapneumonic effusion secondary to pneumonia.   Her pneumonia was  sufficiently treated and at this time, unclear the reason for recurrent left-sided pleural effusion.   Patient is status post thoracentesis with drainage of 1 L of bloody pleural fluid Fluid appears to be an exudate 2D echocardiogram shows a normal LVEF Appreciate pulmonary input.  Patient to follow-up with pulmonary in 2 Ose as     Acute bronchitis Acute respiratory failure -Patient quit smoking 1 year ago, appears at baseline she does not have any COPD symptoms. -No previous PFTs.   -Patient was treated with systemic steroids for acute bronchitis -Hold off antibiotics - Patient was assessed for home oxygen and on room air at rest she had pulse oximetry of 91%.  Pulse oximetry dropped to 86% with ambulation and improved to 92% on 2 L Patient will be discharged home with home oxygen at 2 L for exertion.      HTN, -Continue amlodipine and Coreg -Increase dose of amlodipine to 10 mg daily    Anxiety/depression -Continue SSRI, Vraylar and doxepin     Morbid obesity BMI 39 Complicates overall prognosis and care Lifestyle occasional exercise has been discussed with patient in detail            Consultants: Pulmonary, interventional radiology Procedures performed: Thoracentesis Disposition: Home Diet recommendation:  Discharge Diet Orders (From admission, onward)     Start     Ordered   06/05/23 0000  Diet - low sodium heart healthy        06/05/23 1029           Cardiac diet DISCHARGE MEDICATION: Allergies as of 06/05/2023       Reactions   Lisinopril Cough  Physician Discharge Summary   Patient: Courtney Carrillo MRN: [id number] DOB: 1965-09-05  Admit date:     06/02/2023  Discharge date: 06/05/23  Discharge Physician: Khloi Rawl   PCP: Lorre Munroe, NP   Recommendations at discharge:   Follow-up with pulmonary as an outpatient  Discharge Diagnoses: Principal Problem:   Acute hypoxic respiratory failure (HCC) Active Problems:   Recurrent left pleural effusion   Obesity, Class III, BMI 40-49.9 (morbid obesity) (HCC)   OSA (obstructive sleep apnea)   Anxiety and depression   Hypokalemia   Bronchitis  Resolved Problems:   * No resolved hospital problems. *  Hospital Course:  Courtney Carrillo is a 57 y.o. female with medical history significant of recent pneumonia and parapneumonic left-sided pleural effusion, HTN, HLD, sleep apnea on CPAP at bedtime, anxiety depression, presented with worsening of wheezing.   Symptoms started 2 days ago, patient developed wheezing and increasing exertional dyspnea denied any cough no chest pain no fever or chills.  She came to ED yesterday and her x-ray was clear, patient was diagnosed with bronchitis and sent home with albuterol as needed.  Patient has been using the albuterol several times with some help.  Today however patient symptoms became worse and decided come back to the ED again.  3 [place] ago, patient was hospitalized for new onset of left-sided pleural effusion and thoracentesis was done which showed parapneumonic pleural effusion secondary to pneumonia.   ED Course: Borderline tachycardia, blood pressure elevated.  O2 saturation 100% on room air.  CTA negative for PE but recurrent moderate to large loculated left-sided pleural effusion.  Lactic acid 3.2, hemoglobin 11.4, K3.7, creatinine 0.8  Assessment and Plan:  Recurrent left-sided pleural effusion -Thoracentesis done 3 [place] ago showed an exudate, compatible with parapneumonic effusion secondary to pneumonia.   Her pneumonia was  sufficiently treated and at this time, unclear the reason for recurrent left-sided pleural effusion.   Patient is status post thoracentesis with drainage of 1 L of bloody pleural fluid Fluid appears to be an exudate 2D echocardiogram shows a normal LVEF Appreciate pulmonary input.  Patient to follow-up with pulmonary in 2 Ose as     Acute bronchitis Acute respiratory failure -Patient quit smoking 1 year ago, appears at baseline she does not have any COPD symptoms. -No previous PFTs.   -Patient was treated with systemic steroids for acute bronchitis -Hold off antibiotics - Patient was assessed for home oxygen and on room air at rest she had pulse oximetry of 91%.  Pulse oximetry dropped to 86% with ambulation and improved to 92% on 2 L Patient will be discharged home with home oxygen at 2 L for exertion.      HTN, -Continue amlodipine and Coreg -Increase dose of amlodipine to 10 mg daily    Anxiety/depression -Continue SSRI, Vraylar and doxepin     Morbid obesity BMI 39 Complicates overall prognosis and care Lifestyle occasional exercise has been discussed with patient in detail            Consultants: Pulmonary, interventional radiology Procedures performed: Thoracentesis Disposition: Home Diet recommendation:  Discharge Diet Orders (From admission, onward)     Start     Ordered   06/05/23 0000  Diet - low sodium heart healthy        06/05/23 1029           Cardiac diet DISCHARGE MEDICATION: Allergies as of 06/05/2023       Reactions   Lisinopril Cough  Physician Discharge Summary   Patient: Courtney Carrillo MRN: [id number] DOB: 1965-09-05  Admit date:     06/02/2023  Discharge date: 06/05/23  Discharge Physician: Khloi Rawl   PCP: Lorre Munroe, NP   Recommendations at discharge:   Follow-up with pulmonary as an outpatient  Discharge Diagnoses: Principal Problem:   Acute hypoxic respiratory failure (HCC) Active Problems:   Recurrent left pleural effusion   Obesity, Class III, BMI 40-49.9 (morbid obesity) (HCC)   OSA (obstructive sleep apnea)   Anxiety and depression   Hypokalemia   Bronchitis  Resolved Problems:   * No resolved hospital problems. *  Hospital Course:  Courtney Carrillo is a 57 y.o. female with medical history significant of recent pneumonia and parapneumonic left-sided pleural effusion, HTN, HLD, sleep apnea on CPAP at bedtime, anxiety depression, presented with worsening of wheezing.   Symptoms started 2 days ago, patient developed wheezing and increasing exertional dyspnea denied any cough no chest pain no fever or chills.  She came to ED yesterday and her x-ray was clear, patient was diagnosed with bronchitis and sent home with albuterol as needed.  Patient has been using the albuterol several times with some help.  Today however patient symptoms became worse and decided come back to the ED again.  3 [place] ago, patient was hospitalized for new onset of left-sided pleural effusion and thoracentesis was done which showed parapneumonic pleural effusion secondary to pneumonia.   ED Course: Borderline tachycardia, blood pressure elevated.  O2 saturation 100% on room air.  CTA negative for PE but recurrent moderate to large loculated left-sided pleural effusion.  Lactic acid 3.2, hemoglobin 11.4, K3.7, creatinine 0.8  Assessment and Plan:  Recurrent left-sided pleural effusion -Thoracentesis done 3 [place] ago showed an exudate, compatible with parapneumonic effusion secondary to pneumonia.   Her pneumonia was  sufficiently treated and at this time, unclear the reason for recurrent left-sided pleural effusion.   Patient is status post thoracentesis with drainage of 1 L of bloody pleural fluid Fluid appears to be an exudate 2D echocardiogram shows a normal LVEF Appreciate pulmonary input.  Patient to follow-up with pulmonary in 2 Ose as     Acute bronchitis Acute respiratory failure -Patient quit smoking 1 year ago, appears at baseline she does not have any COPD symptoms. -No previous PFTs.   -Patient was treated with systemic steroids for acute bronchitis -Hold off antibiotics - Patient was assessed for home oxygen and on room air at rest she had pulse oximetry of 91%.  Pulse oximetry dropped to 86% with ambulation and improved to 92% on 2 L Patient will be discharged home with home oxygen at 2 L for exertion.      HTN, -Continue amlodipine and Coreg -Increase dose of amlodipine to 10 mg daily    Anxiety/depression -Continue SSRI, Vraylar and doxepin     Morbid obesity BMI 39 Complicates overall prognosis and care Lifestyle occasional exercise has been discussed with patient in detail            Consultants: Pulmonary, interventional radiology Procedures performed: Thoracentesis Disposition: Home Diet recommendation:  Discharge Diet Orders (From admission, onward)     Start     Ordered   06/05/23 0000  Diet - low sodium heart healthy        06/05/23 1029           Cardiac diet DISCHARGE MEDICATION: Allergies as of 06/05/2023       Reactions   Lisinopril Cough  Physician Discharge Summary   Patient: Courtney Carrillo MRN: [id number] DOB: 1965-09-05  Admit date:     06/02/2023  Discharge date: 06/05/23  Discharge Physician: Khloi Rawl   PCP: Lorre Munroe, NP   Recommendations at discharge:   Follow-up with pulmonary as an outpatient  Discharge Diagnoses: Principal Problem:   Acute hypoxic respiratory failure (HCC) Active Problems:   Recurrent left pleural effusion   Obesity, Class III, BMI 40-49.9 (morbid obesity) (HCC)   OSA (obstructive sleep apnea)   Anxiety and depression   Hypokalemia   Bronchitis  Resolved Problems:   * No resolved hospital problems. *  Hospital Course:  Courtney Carrillo is a 57 y.o. female with medical history significant of recent pneumonia and parapneumonic left-sided pleural effusion, HTN, HLD, sleep apnea on CPAP at bedtime, anxiety depression, presented with worsening of wheezing.   Symptoms started 2 days ago, patient developed wheezing and increasing exertional dyspnea denied any cough no chest pain no fever or chills.  She came to ED yesterday and her x-ray was clear, patient was diagnosed with bronchitis and sent home with albuterol as needed.  Patient has been using the albuterol several times with some help.  Today however patient symptoms became worse and decided come back to the ED again.  3 [place] ago, patient was hospitalized for new onset of left-sided pleural effusion and thoracentesis was done which showed parapneumonic pleural effusion secondary to pneumonia.   ED Course: Borderline tachycardia, blood pressure elevated.  O2 saturation 100% on room air.  CTA negative for PE but recurrent moderate to large loculated left-sided pleural effusion.  Lactic acid 3.2, hemoglobin 11.4, K3.7, creatinine 0.8  Assessment and Plan:  Recurrent left-sided pleural effusion -Thoracentesis done 3 [place] ago showed an exudate, compatible with parapneumonic effusion secondary to pneumonia.   Her pneumonia was  sufficiently treated and at this time, unclear the reason for recurrent left-sided pleural effusion.   Patient is status post thoracentesis with drainage of 1 L of bloody pleural fluid Fluid appears to be an exudate 2D echocardiogram shows a normal LVEF Appreciate pulmonary input.  Patient to follow-up with pulmonary in 2 Ose as     Acute bronchitis Acute respiratory failure -Patient quit smoking 1 year ago, appears at baseline she does not have any COPD symptoms. -No previous PFTs.   -Patient was treated with systemic steroids for acute bronchitis -Hold off antibiotics - Patient was assessed for home oxygen and on room air at rest she had pulse oximetry of 91%.  Pulse oximetry dropped to 86% with ambulation and improved to 92% on 2 L Patient will be discharged home with home oxygen at 2 L for exertion.      HTN, -Continue amlodipine and Coreg -Increase dose of amlodipine to 10 mg daily    Anxiety/depression -Continue SSRI, Vraylar and doxepin     Morbid obesity BMI 39 Complicates overall prognosis and care Lifestyle occasional exercise has been discussed with patient in detail            Consultants: Pulmonary, interventional radiology Procedures performed: Thoracentesis Disposition: Home Diet recommendation:  Discharge Diet Orders (From admission, onward)     Start     Ordered   06/05/23 0000  Diet - low sodium heart healthy        06/05/23 1029           Cardiac diet DISCHARGE MEDICATION: Allergies as of 06/05/2023       Reactions   Lisinopril Cough  The results of significant diagnostics from this hospitalization (including imaging, microbiology, ancillary and laboratory) are listed below for reference.   Imaging Studies: ECHOCARDIOGRAM COMPLETE  Result Date: 06/03/2023    ECHOCARDIOGRAM REPORT   Patient Name:   Courtney Carrillo Date of Exam: 06/03/2023 Medical Rec #:  [id number]      Height:       64.2 in Accession #:    [id number]     Weight:       229.5 lb Date of Birth:  [birth year]      BSA:          2.078 m Patient Age:    57 years       BP:           139/91 mmHg Patient Gender: F              HR:           79 bpm. Exam Location:  [facility] Procedure: 2D Echo, Cardiac Doppler and Color Doppler Indications:     CHF  History:         Patient has no prior history of Echocardiogram examinations.                  CHF; Risk Factors:Sleep Apnea, Hypertension and Dyslipidemia.  Sonographer:     Mikki Harbor Referring Phys:  [id number] RWER ALESKEROV Diagnosing Phys: Julien Nordmann MD  Sonographer Comments: Patient is obese. IMPRESSIONS  1. Left ventricular ejection fraction, by estimation, is 60 to 65%. The left ventricle has normal function. The  left ventricle has no regional wall motion abnormalities. Left ventricular diastolic parameters were normal.  2. Right ventricular systolic function is normal. The right ventricular size is normal. Tricuspid regurgitation signal is inadequate for assessing PA pressure.  3. The mitral valve is normal in structure. No evidence of mitral valve regurgitation. No evidence of mitral stenosis.  4. The aortic valve is normal in structure. Aortic valve regurgitation is not visualized. Aortic valve sclerosis is present, with no evidence of aortic valve stenosis.  5. The inferior vena cava is normal in size with greater than 50% respiratory variability, suggesting right atrial pressure of 3 mmHg. FINDINGS  Left Ventricle: Left ventricular ejection fraction, by estimation, is 60 to 65%. The left ventricle has normal function. The left ventricle has no regional wall motion abnormalities. The left ventricular internal cavity size was normal in size. There is  no left ventricular hypertrophy. Left ventricular diastolic parameters were normal. Right Ventricle: The right ventricular size is normal. No increase in right ventricular wall thickness. Right ventricular systolic function is normal. Tricuspid regurgitation signal is inadequate for assessing PA pressure. Left Atrium: Left atrial size was normal in size. Right Atrium: Right atrial size was normal in size. Pericardium: There is no evidence of pericardial effusion. Mitral Valve: The mitral valve is normal in structure. No evidence of mitral valve regurgitation. No evidence of mitral valve stenosis. MV peak gradient, 4.4 mmHg. The mean mitral valve gradient is 2.0 mmHg. Tricuspid Valve: The tricuspid valve is normal in structure. Tricuspid valve regurgitation is mild . No evidence of tricuspid stenosis. Aortic Valve: The aortic valve is normal in structure. Aortic valve regurgitation is not visualized. Aortic valve sclerosis is present, with no evidence of aortic valve  stenosis. Aortic valve mean gradient measures 6.0 mmHg. Aortic valve peak gradient measures 13.5 mmHg. Aortic valve area, by VTI measures 2.80 cm. Pulmonic Valve: The pulmonic valve was normal in structure. Pulmonic valve regurgitation is  Physician Discharge Summary   Patient: Courtney Carrillo MRN: [id number] DOB: 1965-09-05  Admit date:     06/02/2023  Discharge date: 06/05/23  Discharge Physician: Khloi Rawl   PCP: Lorre Munroe, NP   Recommendations at discharge:   Follow-up with pulmonary as an outpatient  Discharge Diagnoses: Principal Problem:   Acute hypoxic respiratory failure (HCC) Active Problems:   Recurrent left pleural effusion   Obesity, Class III, BMI 40-49.9 (morbid obesity) (HCC)   OSA (obstructive sleep apnea)   Anxiety and depression   Hypokalemia   Bronchitis  Resolved Problems:   * No resolved hospital problems. *  Hospital Course:  Courtney Carrillo is a 57 y.o. female with medical history significant of recent pneumonia and parapneumonic left-sided pleural effusion, HTN, HLD, sleep apnea on CPAP at bedtime, anxiety depression, presented with worsening of wheezing.   Symptoms started 2 days ago, patient developed wheezing and increasing exertional dyspnea denied any cough no chest pain no fever or chills.  She came to ED yesterday and her x-ray was clear, patient was diagnosed with bronchitis and sent home with albuterol as needed.  Patient has been using the albuterol several times with some help.  Today however patient symptoms became worse and decided come back to the ED again.  3 [place] ago, patient was hospitalized for new onset of left-sided pleural effusion and thoracentesis was done which showed parapneumonic pleural effusion secondary to pneumonia.   ED Course: Borderline tachycardia, blood pressure elevated.  O2 saturation 100% on room air.  CTA negative for PE but recurrent moderate to large loculated left-sided pleural effusion.  Lactic acid 3.2, hemoglobin 11.4, K3.7, creatinine 0.8  Assessment and Plan:  Recurrent left-sided pleural effusion -Thoracentesis done 3 [place] ago showed an exudate, compatible with parapneumonic effusion secondary to pneumonia.   Her pneumonia was  sufficiently treated and at this time, unclear the reason for recurrent left-sided pleural effusion.   Patient is status post thoracentesis with drainage of 1 L of bloody pleural fluid Fluid appears to be an exudate 2D echocardiogram shows a normal LVEF Appreciate pulmonary input.  Patient to follow-up with pulmonary in 2 Ose as     Acute bronchitis Acute respiratory failure -Patient quit smoking 1 year ago, appears at baseline she does not have any COPD symptoms. -No previous PFTs.   -Patient was treated with systemic steroids for acute bronchitis -Hold off antibiotics - Patient was assessed for home oxygen and on room air at rest she had pulse oximetry of 91%.  Pulse oximetry dropped to 86% with ambulation and improved to 92% on 2 L Patient will be discharged home with home oxygen at 2 L for exertion.      HTN, -Continue amlodipine and Coreg -Increase dose of amlodipine to 10 mg daily    Anxiety/depression -Continue SSRI, Vraylar and doxepin     Morbid obesity BMI 39 Complicates overall prognosis and care Lifestyle occasional exercise has been discussed with patient in detail            Consultants: Pulmonary, interventional radiology Procedures performed: Thoracentesis Disposition: Home Diet recommendation:  Discharge Diet Orders (From admission, onward)     Start     Ordered   06/05/23 0000  Diet - low sodium heart healthy        06/05/23 1029           Cardiac diet DISCHARGE MEDICATION: Allergies as of 06/05/2023       Reactions   Lisinopril Cough  Physician Discharge Summary   Patient: Courtney Carrillo MRN: [id number] DOB: 1965-09-05  Admit date:     06/02/2023  Discharge date: 06/05/23  Discharge Physician: Khloi Rawl   PCP: Lorre Munroe, NP   Recommendations at discharge:   Follow-up with pulmonary as an outpatient  Discharge Diagnoses: Principal Problem:   Acute hypoxic respiratory failure (HCC) Active Problems:   Recurrent left pleural effusion   Obesity, Class III, BMI 40-49.9 (morbid obesity) (HCC)   OSA (obstructive sleep apnea)   Anxiety and depression   Hypokalemia   Bronchitis  Resolved Problems:   * No resolved hospital problems. *  Hospital Course:  Courtney Carrillo is a 57 y.o. female with medical history significant of recent pneumonia and parapneumonic left-sided pleural effusion, HTN, HLD, sleep apnea on CPAP at bedtime, anxiety depression, presented with worsening of wheezing.   Symptoms started 2 days ago, patient developed wheezing and increasing exertional dyspnea denied any cough no chest pain no fever or chills.  She came to ED yesterday and her x-ray was clear, patient was diagnosed with bronchitis and sent home with albuterol as needed.  Patient has been using the albuterol several times with some help.  Today however patient symptoms became worse and decided come back to the ED again.  3 Ve[place] ago, patient was hospitalized for new onset of left-sided pleural effusion and thoracentesis was done which showed parapneumonic pleural effusion secondary to pneumonia.   ED Course: Borderline tachycardia, blood pressure elevated.  O2 saturation 100% on room air.  CTA negative for PE but recurrent moderate to large loculated left-sided pleural effusion.  Lactic acid 3.2, hemoglobin 11.4, K3.7, creatinine 0.8  Assessment and Plan:  Recurrent left-sided pleural effusion -Thoracentesis done 3 [place] ago showed an exudate, compatible with parapneumonic effusion secondary to pneumonia.   Her pneumonia was  sufficiently treated and at this time, unclear the reason for recurrent left-sided pleural effusion.   Patient is status post thoracentesis with drainage of 1 L of bloody pleural fluid Fluid appears to be an exudate 2D echocardiogram shows a normal LVEF Appreciate pulmonary input.  Patient to follow-up with pulmonary in 2 Ose as     Acute bronchitis Acute respiratory failure -Patient quit smoking 1 year ago, appears at baseline she does not have any COPD symptoms. -No previous PFTs.   -Patient was treated with systemic steroids for acute bronchitis -Hold off antibiotics - Patient was assessed for home oxygen and on room air at rest she had pulse oximetry of 91%.  Pulse oximetry dropped to 86% with ambulation and improved to 92% on 2 L Patient will be discharged home with home oxygen at 2 L for exertion.      HTN, -Continue amlodipine and Coreg -Increase dose of amlodipine to 10 mg daily    Anxiety/depression -Continue SSRI, Vraylar and doxepin     Morbid obesity BMI 39 Complicates overall prognosis and care Lifestyle occasional exercise has been discussed with patient in detail            Consultants: Pulmonary, interventional radiology Procedures performed: Thoracentesis Disposition: Home Diet recommendation:  Discharge Diet Orders (From admission, onward)     Start     Ordered   06/05/23 0000  Diet - low sodium heart healthy        06/05/23 1029           Cardiac diet DISCHARGE MEDICATION: Allergies as of 06/05/2023       Reactions   Lisinopril Cough

## 2023-06-05 NOTE — Progress Notes (Signed)
SATURATION QUALIFICATIONS: (This note is used to comply with regulatory documentation for home oxygen)  Patient Saturations on Room Air at Rest = 91%  Patient Saturations on Room Air while Ambulating = 86%  Patient Saturations on 2 Liters of oxygen while Ambulating = 92%  Please briefly explain why patient needs home oxygen: 

## 2023-06-06 LAB — BODY FLUID CULTURE W GRAM STAIN: Culture: NO GROWTH

## 2023-06-08 ENCOUNTER — Telehealth: Payer: Self-pay | Admitting: *Deleted

## 2023-06-08 ENCOUNTER — Other Ambulatory Visit (HOSPITAL_COMMUNITY): Payer: Self-pay

## 2023-06-08 ENCOUNTER — Telehealth: Payer: Self-pay

## 2023-06-08 DIAGNOSIS — J9601 Acute respiratory failure with hypoxia: Secondary | ICD-10-CM

## 2023-06-08 NOTE — Consult Note (Signed)
[facility] Liaison Note  06/08/2023  [name] 1965-09-23 [id number]  Location: screened the patient remotely at [facility] ED.  Insurance: [employer]   Courtney Carrillo is a 57 y.o. female who is a Primary Care Patient of Lorre Munroe, NP The patient was screened for 30 day readmission hospitalization with noted medium risk score for unplanned readmission risk with 2 IP/2 ED in 6 months.  The patient was assessed for potential Care Management service needs for post hospital transition for care coordination. Review of patient's electronic medical record reveals patient was admitted with Acute Hypoxia respiratory failure. Liaison spoke with pt concerning [facility] community case management services. Pt receptive to a nurse care coordinator post hospital prevention readmission follow up call. Will make a referral for services.    [facility] Care Management/Population Health does not replace or interfere with any arrangements made by the Inpatient Transition of Care team.   For questions contact:   Elliot Cousin, RN, [facility] Liaison [place]   [facility], Population Health Office Hours MTWF  8:00 am-6:00 pm Direct Dial: [phone] mobile [phone] [Office toll free line] Office Hours are M-F 8:30 - 5 pm [email or web address]

## 2023-06-08 NOTE — Progress Notes (Signed)
Care Coordination   Note   06/08/2023 Name: TERRIYAH LINNEBUR MRN: [id number] DOB: 21-Nov-1965  Clementeen Graham Raj is a 57 y.o. year old female who sees Baity, Salvadore Oxford, NP for primary care. I reached out to [name] by phone today to offer care coordination services.  Ms. Edgell was given information about Care Coordination services today including:   The Care Coordination services include support from the care team which includes your Nurse Coordinator, Clinical Social Worker, or Pharmacist.  The Care Coordination team is here to help remove barriers to the health concerns and goals most important to you. Care Coordination services are voluntary, and the patient may decline or stop services at any time by request to their care team member.   Care Coordination Consent Status: Patient agreed to services and verbal consent obtained.   Follow up plan:  Telephone appointment with care coordination team member scheduled for:  06/11/2023  Encounter Outcome:  Patient Scheduled from referral   Burman Nieves, [facility] Care Coordination Care Guide Direct Dial: [phone]

## 2023-06-11 ENCOUNTER — Other Ambulatory Visit: Payer: Self-pay | Admitting: *Deleted

## 2023-06-11 ENCOUNTER — Other Ambulatory Visit: Payer: Self-pay

## 2023-06-11 LAB — MISC LABCORP TEST (SEND OUT): Labcorp test code: 5367

## 2023-06-11 NOTE — Patient Outreach (Signed)
Care Management   Visit Note  06/11/2023 Name: Courtney Carrillo MRN: [id number] DOB: [birth year]  Subjective: Courtney Carrillo is a 57 y.o. year old female who is a primary care patient of Lorre Munroe, NP. The Care Management team was consulted for assistance.      Engaged with patient spoke with patient by telephone.    Goals Addressed             This Visit's Progress    RNCM Care Managment Expected Outcomes: Monitor, Self-Manage and Reduce Symptoms of Hypertension, HLD, Acute Respiratory Failure       Current Barriers:  Knowledge Deficits related to plan of care for management of HTN, HLD, and Acute Respiratory Failure/OSA/Bronchitis  Chronic Disease Management support and education needs related to HTN, HLD, and Acute Respiratory Failure/OSA/Bronchitis   RNCM Clinical Goal(s):  Patient will verbalize basic understanding of  HTN, HLD, and Acute Respiratory Failure/OSA/Bronchitis disease process and self health management plan as evidenced by adherance to plan of care and reduced hospitalizations and ED visits take all medications exactly as prescribed and will call provider for medication related questions as evidenced by compliance with medication regime attend all scheduled medical appointments: with PCP as evidenced by keeping appointments and calling for any new concerns if needed demonstrate Improved and Ongoing adherence to prescribed treatment plan for HTN and HLD as evidenced by adherence to treatment plan continue to work with RN Care Manager to address care management and care coordination needs related to  HTN, HLD, and OSA/Bronchitis as evidenced by adherence to CM Team Scheduled appointments experience decrease in ED visits as evidenced by EMR review.  ED visits in in last 6 months = 2 with 2 hospitalizations  through collaboration with RN Care manager, provider, and care team.   Interventions: Evaluation of current treatment plan related to  self management and  patient's adherence to plan as established by provider   Acute Respiratory Failure/OSA/Bronchitis: (Status:New goal. and Goal on track:  Yes.) Long Term Goal Provided patient with basic written and verbal respiratory failure education on self care/management/and exacerbation prevention Advised patient to track and manage respiratory triggers Provided written and verbal instructions on pursed lip breathing and utilized returned demonstration as teach back Provided instruction about proper use of medications used for management of inhalers Provided education about and advised patient to utilize infection prevention strategies to reduce risk of respiratory infection Discussed the importance of adequate rest and management of fatigue  Screening for signs and symptoms of depression related to chronic disease state  Assessed social determinant of health barriers    Hyperlipidemia Interventions:  (Status:  New goal. and Goal on track:  Yes.) Long Term Goal Medication review performed; medication list updated in electronic medical record.  Provider established cholesterol goals reviewed Counseled on importance of regular laboratory monitoring as prescribed Provided HLD educational materials Reviewed role and benefits of statin for ASCVD risk reduction Discussed strategies to manage statin-induced myalgias Reviewed importance of limiting foods high in cholesterol Reviewed exercise goals and target of 150 minutes per week Screening for signs and symptoms of depression related to chronic disease state Assessed social determinant of health barriers   Hypertension Interventions:  (Status:  New goal. and Goal on track:  Yes.) Long Term Goal Last practice recorded BP readings:  BP Readings from Last 3 Encounters:  06/05/23 (!) 165/102  06/01/23 (!) 168/92  05/15/23 138/86   Most recent eGFR/CrCl:  Lab Results  Component Value Date   EGFR 87  01/28/2023    No components found for: "CRCL"  Lab  Results  Component Value Date   NA 141 06/05/2023   CL 101 06/05/2023   K 3.0 (L) 06/05/2023   CO2 31 06/05/2023   BUN 14 06/05/2023   CREATININE 0.91 06/05/2023   GFRNONAA >60 06/05/2023   CALCIUM 8.9 06/05/2023   ALBUMIN 3.6 06/04/2023   GLUCOSE 98 06/05/2023     Evaluation of current treatment plan related to hypertension self management and patient's adherence to plan as established by provider Provided education to patient re: stroke prevention, s/s of heart attack and stroke Reviewed medications with patient and discussed importance of compliance Counseled on adverse effects of illicit drug and excessive alcohol use in patients with high blood pressure  Counseled on the importance of exercise goals with target of 150 minutes per week Discussed plans with patient for ongoing care management follow up and provided patient with direct contact information for care management team Advised patient, providing education and rationale, to monitor blood pressure daily and record, calling PCP for findings outside established parameters Discussed complications of poorly controlled blood pressure such as heart disease, stroke, circulatory complications, vision complications, kidney impairment, sexual dysfunction Screening for signs and symptoms of depression related to chronic disease state  Assessed social determinant of health barriers  Patient Goals/Self-Care Activities: Take all medications as prescribed Attend all scheduled provider appointments Call pharmacy for medication refills 3-7 days in advance of running out of medications Attend church or other social activities Perform all self care activities independently  Perform IADL's (shopping, preparing meals, housekeeping, managing finances) independently Call provider office for new concerns or questions  call the Suicide and Crisis Lifeline: 988 call the [place] National Suicide Prevention Lifeline: 505-480-1634 or TTY: (403)249-9173  TTY (323)702-3866) to talk to a trained counselor call 1-800-273-TALK (toll free, 24 hour hotline) call 911 if experiencing a Mental Health or Behavioral Health Crisis  identify and avoid work-related triggers keep follow-up appointments: with provider eat healthy/prescribed diet: heart-healthy, low sodium get at least 7 to 8 hours of sleep at night do breathing exercises every day check blood pressure daily write blood pressure results in a log or diary take blood pressure log to all doctor appointments call doctor for signs and symptoms of high blood pressure take medications for blood pressure exactly as prescribed begin an exercise program eat more whole grains, fruits and vegetables, lean meats and healthy fats call for medicine refill 2 or 3 days before it runs out  Follow Up Plan:  Telephone follow up appointment with care management team member scheduled for:  07-17-2023 at 1:00 pm            Consent to Services:  Patient was given information about care management services, agreed to services, and gave verbal consent to participate.   Plan: Telephone follow up appointment with care management team member scheduled for: 07-17-2023 at 1:00 pm  Danise Edge, BSN RN RN Care Manager  [facility]  Ambulatory Care Management  Direct Number: [phone]

## 2023-06-11 NOTE — Patient Outreach (Signed)
Care Management   Visit Note  06/11/2023 Name: Courtney Carrillo MRN: [id number] DOB: [birth year]  Subjective: Courtney Carrillo is a 57 y.o. year old female who is a primary care patient of Courtney Munroe, NP. The Care Management team was consulted for assistance.      Engaged with patient spoke with patient by telephone.    Goals Addressed             This Visit's Progress    RNCM Care Managment Expected Outcomes: Monitor, Self-Manage and Reduce Symptoms of Hypertension, HLD, Acute Respiratory Failure       Current Barriers:  Knowledge Deficits related to plan of care for management of HTN, HLD, and Acute Respiratory Failure/OSA/Bronchitis  Chronic Disease Management support and education needs related to HTN, HLD, and Acute Respiratory Failure/OSA/Bronchitis   RNCM Clinical Goal(s):  Patient will verbalize basic understanding of  HTN, HLD, and Acute Respiratory Failure/OSA/Bronchitis disease process and self health management plan as evidenced by adherance to plan of care and reduced hospitalizations and ED visits take all medications exactly as prescribed and will call provider for medication related questions as evidenced by compliance with medication regime attend all scheduled medical appointments: with PCP as evidenced by keeping appointments and calling for any new concerns if needed demonstrate Improved and Ongoing adherence to prescribed treatment plan for HTN and HLD as evidenced by adherence to treatment plan continue to work with RN Care Manager to address care management and care coordination needs related to  HTN, HLD, and OSA/Bronchitis as evidenced by adherence to CM Team Scheduled appointments experience decrease in ED visits as evidenced by EMR review.  ED visits in in last 6 months = 2 with 2 hospitalizations  through collaboration with RN Care manager, provider, and care team.   Interventions: Evaluation of current treatment plan related to  self management and  patient's adherence to plan as established by provider   Acute Respiratory Failure/OSA/Bronchitis: (Status:New goal. and Goal on track:  Yes.) Long Term Goal Provided patient with basic written and verbal respiratory failure education on self care/management/and exacerbation prevention Advised patient to track and manage respiratory triggers Provided written and verbal instructions on pursed lip breathing and utilized returned demonstration as teach back Provided instruction about proper use of medications used for management of inhalers Provided education about and advised patient to utilize infection prevention strategies to reduce risk of respiratory infection Discussed the importance of adequate rest and management of fatigue  Screening for signs and symptoms of depression related to chronic disease state  Assessed social determinant of health barriers    Hyperlipidemia Interventions:  (Status:  New goal. and Goal on track:  Yes.) Long Term Goal Medication review performed; medication list updated in electronic medical record.  Provider established cholesterol goals reviewed Counseled on importance of regular laboratory monitoring as prescribed Provided HLD educational materials Reviewed role and benefits of statin for ASCVD risk reduction Discussed strategies to manage statin-induced myalgias Reviewed importance of limiting foods high in cholesterol Reviewed exercise goals and target of 150 minutes per week Screening for signs and symptoms of depression related to chronic disease state Assessed social determinant of health barriers   Hypertension Interventions:  (Status:  New goal. and Goal on track:  Yes.) Long Term Goal Last practice recorded BP readings:  BP Readings from Last 3 Encounters:  06/05/23 (!) 165/102  06/01/23 (!) 168/92  05/15/23 138/86   Most recent eGFR/CrCl:  Lab Results  Component Value Date   EGFR 87  01/28/2023    No components found for: "CRCL"  Lab  Results  Component Value Date   NA 141 06/05/2023   CL 101 06/05/2023   K 3.0 (L) 06/05/2023   CO2 31 06/05/2023   BUN 14 06/05/2023   CREATININE 0.91 06/05/2023   GFRNONAA >60 06/05/2023   CALCIUM 8.9 06/05/2023   ALBUMIN 3.6 06/04/2023   GLUCOSE 98 06/05/2023     Evaluation of current treatment plan related to hypertension self management and patient's adherence to plan as established by provider Provided education to patient re: stroke prevention, s/s of heart attack and stroke Reviewed medications with patient and discussed importance of compliance Counseled on adverse effects of illicit drug and excessive alcohol use in patients with high blood pressure  Counseled on the importance of exercise goals with target of 150 minutes per week Discussed plans with patient for ongoing care management follow up and provided patient with direct contact information for care management team Advised patient, providing education and rationale, to monitor blood pressure daily and record, calling PCP for findings outside established parameters Discussed complications of poorly controlled blood pressure such as heart disease, stroke, circulatory complications, vision complications, kidney impairment, sexual dysfunction Screening for signs and symptoms of depression related to chronic disease state  Assessed social determinant of health barriers  Patient Goals/Self-Care Activities: Take all medications as prescribed Attend all scheduled provider appointments Call pharmacy for medication refills 3-7 days in advance of running out of medications Attend church or other social activities Perform all self care activities independently  Perform IADL's (shopping, preparing meals, housekeeping, managing finances) independently Call provider office for new concerns or questions  call the Suicide and Crisis Lifeline: 988 call the [place] National Suicide Prevention Lifeline: 505-480-1634 or TTY: (403)249-9173  TTY (323)702-3866) to talk to a trained counselor call 1-800-273-TALK (toll free, 24 hour hotline) call 911 if experiencing a Mental Health or Behavioral Health Crisis  identify and avoid work-related triggers keep follow-up appointments: with provider eat healthy/prescribed diet: heart-healthy, low sodium get at least 7 to 8 hours of sleep at night do breathing exercises every day check blood pressure daily write blood pressure results in a log or diary take blood pressure log to all doctor appointments call doctor for signs and symptoms of high blood pressure take medications for blood pressure exactly as prescribed begin an exercise program eat more whole grains, fruits and vegetables, lean meats and healthy fats call for medicine refill 2 or 3 days before it runs out  Follow Up Plan:  Telephone follow up appointment with care management team member scheduled for:  07-17-2023 at 1:00 pm            Consent to Services:  Patient was given information about care management services, agreed to services, and gave verbal consent to participate.   Plan: Telephone follow up appointment with care management team member scheduled for: 07-17-2023 at 1:00 pm  Danise Edge, BSN RN RN Care Manager  [facility]  Ambulatory Care Management  Direct Number: [phone]

## 2023-06-11 NOTE — Patient Instructions (Signed)
Care Management   Initial Visit Note  06/11/2023 Name: Courtney Carrillo MRN: [id number] DOB: 1965/09/25  Courtney Carrillo is enrolled in a Managed Medicaid plan: No. Outreach attempt today was successful.   Subjective:   Objective:  Assessment: Courtney Carrillo is a 57 y.o. year old female who sees Baity, Salvadore Oxford, NP for primary care. The care management team was consulted for assistance with care management and care coordination needs related to Disease Management, Educational Needs, and Care Coordination.   Review of patient status, including review of consultants reports, relevant laboratory and other test results, and collaboration with appropriate care team members and the patient's provider was performed as part of comprehensive patient evaluation and provision of care management services.    SDOH (Social Determinants of Health) screening performed today. See Care Plan Entry related to challenges with: Social Connections and Physical Activity   Goals Addressed             This Visit's Progress    RNCM Care Managment Expected Outcomes: Monitor, Self-Manage and Reduce Symptoms of Hypertension, HLD, Acute Respiratory Failure       Current Barriers:  Knowledge Deficits related to plan of care for management of HTN, HLD, and Acute Respiratory Failure/OSA/Bronchitis  Chronic Disease Management support and education needs related to HTN, HLD, and Acute Respiratory Failure/OSA/Bronchitis   RNCM Clinical Goal(s):  Patient will verbalize basic understanding of  HTN, HLD, and Acute Respiratory Failure/OSA/Bronchitis disease process and self health management plan as evidenced by adherance to plan of care and reduced hospitalizations and ED visits take all medications exactly as prescribed and will call provider for medication related questions as evidenced by compliance with medication regime attend all scheduled medical appointments: with PCP as evidenced by keeping appointments and calling  for any new concerns if needed demonstrate Improved and Ongoing adherence to prescribed treatment plan for HTN and HLD as evidenced by adherence to treatment plan continue to work with RN Care Manager to address care management and care coordination needs related to  HTN, HLD, and OSA/Bronchitis as evidenced by adherence to CM Team Scheduled appointments experience decrease in ED visits as evidenced by EMR review.  ED visits in in last 6 months = 2 with 2 hospitalizations  through collaboration with RN Care manager, provider, and care team.   Interventions: Evaluation of current treatment plan related to  self management and patient's adherence to plan as established by provider   Acute Respiratory Failure/OSA/Bronchitis: (Status:New goal. and Goal on track:  Yes.) Long Term Goal Provided patient with basic written and verbal respiratory failure education on self care/management/and exacerbation prevention Advised patient to track and manage respiratory triggers Provided written and verbal instructions on pursed lip breathing and utilized returned demonstration as teach back Provided instruction about proper use of medications used for management of inhalers Provided education about and advised patient to utilize infection prevention strategies to reduce risk of respiratory infection Discussed the importance of adequate rest and management of fatigue  Screening for signs and symptoms of depression related to chronic disease state  Assessed social determinant of health barriers    Hyperlipidemia Interventions:  (Status:  New goal. and Goal on track:  Yes.) Long Term Goal Medication review performed; medication list updated in electronic medical record.  Provider established cholesterol goals reviewed Counseled on importance of regular laboratory monitoring as prescribed Provided HLD educational materials Reviewed role and benefits of statin for ASCVD risk reduction Discussed strategies to  manage statin-induced myalgias Reviewed importance  of limiting foods high in cholesterol Reviewed exercise goals and target of 150 minutes per week Screening for signs and symptoms of depression related to chronic disease state Assessed social determinant of health barriers   Hypertension Interventions:  (Status:  New goal. and Goal on track:  Yes.) Long Term Goal Last practice recorded BP readings:  BP Readings from Last 3 Encounters:  06/05/23 (!) 165/102  06/01/23 (!) 168/92  05/15/23 138/86   Most recent eGFR/CrCl:  Lab Results  Component Value Date   EGFR 87 01/28/2023    No components found for: "CRCL"  Lab Results  Component Value Date   NA 141 06/05/2023   CL 101 06/05/2023   K 3.0 (L) 06/05/2023   CO2 31 06/05/2023   BUN 14 06/05/2023   CREATININE 0.91 06/05/2023   GFRNONAA >60 06/05/2023   CALCIUM 8.9 06/05/2023   ALBUMIN 3.6 06/04/2023   GLUCOSE 98 06/05/2023     Evaluation of current treatment plan related to hypertension self management and patient's adherence to plan as established by provider Provided education to patient re: stroke prevention, s/s of heart attack and stroke Reviewed medications with patient and discussed importance of compliance Counseled on adverse effects of illicit drug and excessive alcohol use in patients with high blood pressure  Counseled on the importance of exercise goals with target of 150 minutes per week Discussed plans with patient for ongoing care management follow up and provided patient with direct contact information for care management team Advised patient, providing education and rationale, to monitor blood pressure daily and record, calling PCP for findings outside established parameters Discussed complications of poorly controlled blood pressure such as heart disease, stroke, circulatory complications, vision complications, kidney impairment, sexual dysfunction Screening for signs and symptoms of depression related to  chronic disease state  Assessed social determinant of health barriers  Patient Goals/Self-Care Activities: Take all medications as prescribed Attend all scheduled provider appointments Call pharmacy for medication refills 3-7 days in advance of running out of medications Attend church or other social activities Perform all self care activities independently  Perform IADL's (shopping, preparing meals, housekeeping, managing finances) independently Call provider office for new concerns or questions  call the Suicide and Crisis Lifeline: 988 call the [place] National Suicide Prevention Lifeline: 4252542321 or TTY: (830)383-8087 TTY 832-601-4650) to talk to a trained counselor call 1-800-273-TALK (toll free, 24 hour hotline) call 911 if experiencing a Mental Health or Behavioral Health Crisis  identify and avoid work-related triggers keep follow-up appointments: with provider eat healthy/prescribed diet: heart-healthy, low sodium get at least 7 to 8 hours of sleep at night do breathing exercises every day check blood pressure daily write blood pressure results in a log or diary take blood pressure log to all doctor appointments call doctor for signs and symptoms of high blood pressure take medications for blood pressure exactly as prescribed begin an exercise program eat more whole grains, fruits and vegetables, lean meats and healthy fats call for medicine refill 2 or 3 days before it runs out  Follow Up Plan:  Telephone follow up appointment with care management team member scheduled for:  07-17-2023 at 1:00 pm           Follow up plan:  Telephone follow up appointment with care management team member scheduled for: 07-17-2023 at 1:00 pm  Ms. Dimarzo was given information about Care Management services today including:  Care Management services include personalized support from designated clinical staff supervised by a physician, including individualized plan of  care and  coordination with other care providers 24/7 contact phone numbers for assistance for urgent and routine care needs. The patient may stop CCM services at any time (effective at the end of the month) by phone call to the office staff.  Patient agreed to services and verbal consent obtained.  Danise Edge, BSN RN RN Care Manager  [facility]  Direct Number: [phone]

## 2023-06-11 NOTE — Patient Instructions (Signed)
Care Management   Initial Visit Note  06/11/2023 Name: Courtney Carrillo MRN: [id number] DOB: 1965/09/25  Courtney Carrillo is enrolled in a Managed Medicaid plan: No. Outreach attempt today was successful.   Subjective:   Objective:  Assessment: Courtney Carrillo is a 57 y.o. year old female who sees Courtney Carrillo, Courtney Oxford, NP for primary care. The care management team was consulted for assistance with care management and care coordination needs related to Disease Management, Educational Needs, and Care Coordination.   Review of patient status, including review of consultants reports, relevant laboratory and other test results, and collaboration with appropriate care team members and the patient's provider was performed as part of comprehensive patient evaluation and provision of care management services.    SDOH (Social Determinants of Health) screening performed today. See Care Plan Entry related to challenges with: Social Connections and Physical Activity   Goals Addressed             This Visit's Progress    RNCM Care Managment Expected Outcomes: Monitor, Self-Manage and Reduce Symptoms of Hypertension, HLD, Acute Respiratory Failure       Current Barriers:  Knowledge Deficits related to plan of care for management of HTN, HLD, and Acute Respiratory Failure/OSA/Bronchitis  Chronic Disease Management support and education needs related to HTN, HLD, and Acute Respiratory Failure/OSA/Bronchitis   RNCM Clinical Goal(s):  Patient will verbalize basic understanding of  HTN, HLD, and Acute Respiratory Failure/OSA/Bronchitis disease process and self health management plan as evidenced by adherance to plan of care and reduced hospitalizations and ED visits take all medications exactly as prescribed and will call provider for medication related questions as evidenced by compliance with medication regime attend all scheduled medical appointments: with PCP as evidenced by keeping appointments and calling  for any new concerns if needed demonstrate Improved and Ongoing adherence to prescribed treatment plan for HTN and HLD as evidenced by adherence to treatment plan continue to work with RN Care Manager to address care management and care coordination needs related to  HTN, HLD, and OSA/Bronchitis as evidenced by adherence to CM Team Scheduled appointments experience decrease in ED visits as evidenced by EMR review.  ED visits in in last 6 months = 2 with 2 hospitalizations  through collaboration with RN Care manager, provider, and care team.   Interventions: Evaluation of current treatment plan related to  self management and patient's adherence to plan as established by provider   Acute Respiratory Failure/OSA/Bronchitis: (Status:New goal. and Goal on track:  Yes.) Long Term Goal Provided patient with basic written and verbal respiratory failure education on self care/management/and exacerbation prevention Advised patient to track and manage respiratory triggers Provided written and verbal instructions on pursed lip breathing and utilized returned demonstration as teach back Provided instruction about proper use of medications used for management of inhalers Provided education about and advised patient to utilize infection prevention strategies to reduce risk of respiratory infection Discussed the importance of adequate rest and management of fatigue  Screening for signs and symptoms of depression related to chronic disease state  Assessed social determinant of health barriers    Hyperlipidemia Interventions:  (Status:  New goal. and Goal on track:  Yes.) Long Term Goal Medication review performed; medication list updated in electronic medical record.  Provider established cholesterol goals reviewed Counseled on importance of regular laboratory monitoring as prescribed Provided HLD educational materials Reviewed role and benefits of statin for ASCVD risk reduction Discussed strategies to  manage statin-induced myalgias Reviewed importance  of limiting foods high in cholesterol Reviewed exercise goals and target of 150 minutes per week Screening for signs and symptoms of depression related to chronic disease state Assessed social determinant of health barriers   Hypertension Interventions:  (Status:  New goal. and Goal on track:  Yes.) Long Term Goal Last practice recorded BP readings:  BP Readings from Last 3 Encounters:  06/05/23 (!) 165/102  06/01/23 (!) 168/92  05/15/23 138/86   Most recent eGFR/CrCl:  Lab Results  Component Value Date   EGFR 87 01/28/2023    No components found for: "CRCL"  Lab Results  Component Value Date   NA 141 06/05/2023   CL 101 06/05/2023   K 3.0 (L) 06/05/2023   CO2 31 06/05/2023   BUN 14 06/05/2023   CREATININE 0.91 06/05/2023   GFRNONAA >60 06/05/2023   CALCIUM 8.9 06/05/2023   ALBUMIN 3.6 06/04/2023   GLUCOSE 98 06/05/2023     Evaluation of current treatment plan related to hypertension self management and patient's adherence to plan as established by provider Provided education to patient re: stroke prevention, s/s of heart attack and stroke Reviewed medications with patient and discussed importance of compliance Counseled on adverse effects of illicit drug and excessive alcohol use in patients with high blood pressure  Counseled on the importance of exercise goals with target of 150 minutes per week Discussed plans with patient for ongoing care management follow up and provided patient with direct contact information for care management team Advised patient, providing education and rationale, to monitor blood pressure daily and record, calling PCP for findings outside established parameters Discussed complications of poorly controlled blood pressure such as heart disease, stroke, circulatory complications, vision complications, kidney impairment, sexual dysfunction Screening for signs and symptoms of depression related to  chronic disease state  Assessed social determinant of health barriers  Patient Goals/Self-Care Activities: Take all medications as prescribed Attend all scheduled provider appointments Call pharmacy for medication refills 3-7 days in advance of running out of medications Attend church or other social activities Perform all self care activities independently  Perform IADL's (shopping, preparing meals, housekeeping, managing finances) independently Call provider office for new concerns or questions  call the Suicide and Crisis Lifeline: 988 call the [place] National Suicide Prevention Lifeline: 4252542321 or TTY: (830)383-8087 TTY 832-601-4650) to talk to a trained counselor call 1-800-273-TALK (toll free, 24 hour hotline) call 911 if experiencing a Mental Health or Behavioral Health Crisis  identify and avoid work-related triggers keep follow-up appointments: with provider eat healthy/prescribed diet: heart-healthy, low sodium get at least 7 to 8 hours of sleep at night do breathing exercises every day check blood pressure daily write blood pressure results in a log or diary take blood pressure log to all doctor appointments call doctor for signs and symptoms of high blood pressure take medications for blood pressure exactly as prescribed begin an exercise program eat more whole grains, fruits and vegetables, lean meats and healthy fats call for medicine refill 2 or 3 days before it runs out  Follow Up Plan:  Telephone follow up appointment with care management team member scheduled for:  07-17-2023 at 1:00 pm           Follow up plan:  Telephone follow up appointment with care management team member scheduled for: 07-17-2023 at 1:00 pm  Courtney Carrillo was given information about Care Management services today including:  Care Management services include personalized support from designated clinical staff supervised by a physician, including individualized plan of  care and  coordination with other care providers 24/7 contact phone numbers for assistance for urgent and routine care needs. The patient may stop CCM services at any time (effective at the end of the month) by phone call to the office staff.  Patient agreed to services and verbal consent obtained.  Danise Edge, BSN RN RN Care Manager  [facility]  Direct Number: [phone]

## 2023-06-11 NOTE — Patient Outreach (Signed)
Error in charting. See new encounter 

## 2023-06-17 ENCOUNTER — Encounter: Payer: Self-pay | Admitting: Internal Medicine

## 2023-06-17 ENCOUNTER — Ambulatory Visit (INDEPENDENT_AMBULATORY_CARE_PROVIDER_SITE_OTHER): Payer: 59 | Admitting: Internal Medicine

## 2023-06-17 VITALS — BP 130/80 | HR 80 | Ht 64.17 in | Wt 232.0 lb

## 2023-06-17 DIAGNOSIS — S2242XG Multiple fractures of ribs, left side, subsequent encounter for fracture with delayed healing: Secondary | ICD-10-CM

## 2023-06-17 DIAGNOSIS — J9601 Acute respiratory failure with hypoxia: Secondary | ICD-10-CM | POA: Diagnosis not present

## 2023-06-17 DIAGNOSIS — J90 Pleural effusion, not elsewhere classified: Secondary | ICD-10-CM | POA: Diagnosis not present

## 2023-06-17 NOTE — Progress Notes (Signed)
Subjective:    Patient ID: JAYLISE PEEK, female    DOB: 1966/06/08, 57 y.o.   MRN: [id number]  HPI  Pt presents to the clinic today for hospital followup. She went to the ER 10/22 with worsening cough, wheezing and shortness of breath. She had gone to the ER 10/21 with similar symptoms, diagnosed with bronchitis and treated with prednisone. She had previously sustained a fall September 2024 resulting in multiple left sided rib fractures complicated by pleural effusion. She had a thoracentesis at that time and was discharged from the hospital with antibiotics to cover for pneumonia. During this admission, CTA showed a recurrent parapneumonic pleural effusion. Echo showed a normal EF.  Cardiology and pulmonology were consulted, she was placed on supportive oxygen for acute hypoxic respiratory failure. She had a liter of bloody fluid drained.  She was not placed on any additional antibiotics at that time but was treated with steroids.  She was discharged on 10/25, given oxygen at to wear at home.  Since that time, she has weaned herself off the oxygen. She is not on steroid any longer. The left side rib pain is gone but she still occasionally has shortness of breath. She follows up with pulmonology next week.  Review of Systems     Past Medical History:  Diagnosis Date   Anemia    2015   Anxiety    Depression    Headache    H/O   Hyperlipidemia    Hypertension    Sleep apnea    CPAP    Current Outpatient Medications  Medication Sig Dispense Refill   acetaminophen-codeine (TYLENOL #3) 300-30 MG tablet Take 1 tablet by mouth every 8 (eight) hours as needed for moderate pain (pain score 4-6). 20 tablet 0   albuterol (VENTOLIN HFA) 108 (90 Base) MCG/ACT inhaler Inhale 2 puffs into the lungs every 6 (six) hours as needed for wheezing or shortness of breath. 8 g 2   amLODipine (NORVASC) 10 MG tablet Take 1 tablet (10 mg total) by mouth daily. 30 tablet 11   atorvastatin (LIPITOR) 40 MG  tablet TAKE 1 TABLET(40 MG) BY MOUTH EVERY MORNING 90 tablet 2   benztropine (COGENTIN) 1 MG tablet Take 1 mg by mouth every morning.     budesonide-formoterol (SYMBICORT) 160-4.5 MCG/ACT inhaler Inhale 2 puffs into the lungs 2 (two) times daily. 1 each 12   busPIRone (BUSPAR) 10 MG tablet Take 20 mg by mouth 2 (two) times daily.     carvedilol (COREG) 3.125 MG tablet Take 1 tablet (3.125 mg total) by mouth 2 (two) times daily with a meal. 60 tablet 0   doxepin (SINEQUAN) 50 MG capsule Take 50 mg by mouth at bedtime.     lidocaine (LIDODERM) 5 % Place 1 patch onto the skin every 12 (twelve) hours. Remove & Discard patch within 12 hours or as directed by MD (Patient not taking: Reported on 06/11/2023) 10 patch 0   methocarbamol (ROBAXIN) 500 MG tablet Take 1 tablet (500 mg total) by mouth every 8 (eight) hours as needed for muscle spasms. 30 tablet 0   VRAYLAR 6 MG CAPS Take 1 capsule by mouth daily.     No current facility-administered medications for this visit.    Allergies  Allergen Reactions   Lisinopril Cough   Losartan Cough   Other Other (See Comments)    Perfumes : Congestion    Family History  Problem Relation Age of Onset   Diabetes Mother  Breast cancer Sister     Social History   Socioeconomic History   Marital status: Single    Spouse name: Not on file   Number of children: Not on file   Years of education: Not on file   Highest education level: Not on file  Occupational History   Not on file  Tobacco Use   Smoking status: Former    Current packs/day: 0.50    Average packs/day: 0.5 packs/day for 35.2 years (17.6 ttl pk-yrs)    Types: Cigarettes    Start date: 09/12/2021   Smokeless tobacco: Never  Vaping Use   Vaping status: Never Used  Substance and Sexual Activity   Alcohol use: No   Drug use: Yes    Frequency: 7.0 times per week    Types: Marijuana   Sexual activity: Not on file  Other Topics Concern   Not on file  Social History Narrative   Not  on file   Social Determinants of Health   Financial Resource Strain: Low Risk  (12/25/2022)   Overall Financial Resource Strain (CARDIA)    Difficulty of Paying Living Expenses: Not hard at all  Food Insecurity: No Food Insecurity (06/03/2023)   Hunger Vital Sign    Worried About Running Out of Food in the Last Year: Never true    Ran Out of Food in the Last Year: Never true  Transportation Needs: No Transportation Needs (06/03/2023)   PRAPARE - Administrator, Civil Service (Medical): No    Lack of Transportation (Non-Medical): No  Physical Activity: Inactive (06/11/2023)   Exercise Vital Sign    Days of Exercise per Week: 0 days    Minutes of Exercise per Session: 0 min  Stress: No Stress Concern Present (12/25/2022)   [employer] of Occupational Health - Occupational Stress Questionnaire    Feeling of Stress : Not at all  Social Connections: Socially Isolated (06/11/2023)   Social Connection and Isolation Panel [NHANES]    Frequency of Communication with Friends and Family: More than three times a week    Frequency of Social Gatherings with Friends and Family: Once a week    Attends Religious Services: Never    Database administrator or Organizations: No    Attends Banker Meetings: Never    Marital Status: Never married  Intimate Partner Violence: Not At Risk (06/03/2023)   Humiliation, Afraid, Rape, and Kick questionnaire    Fear of Current or Ex-Partner: No    Emotionally Abused: No    Physically Abused: No    Sexually Abused: No     Constitutional: Denies fever, malaise, fatigue, headache or abrupt weight changes.  HEENT: Denies eye pain, eye redness, ear pain, ringing in the ears, wax buildup, runny nose, nasal congestion, bloody nose, or sore throat. Respiratory: Pt reports shortness of breath. Denies difficulty breathing, cough or sputum production.   Cardiovascular: Denies chest pain, chest tightness, palpitations or swelling in the  hands or feet.  Gastrointestinal: Denies abdominal pain, bloating, constipation, diarrhea or blood in the stool.  GU: Denies urgency, frequency, pain with urination, burning sensation, blood in urine, odor or discharge. Musculoskeletal: Denies decrease in range of motion, difficulty with gait, muscle pain or joint pain and swelling.  Skin: Denies redness, rashes, lesions or ulcercations.  Neurological: Denies dizziness, difficulty with memory, difficulty with speech or problems with balance and coordination.  Psych: Denies anxiety, depression, SI/HI.  No other specific complaints in a complete review of systems (except  as listed in HPI above).  Objective:   Physical Exam   BP 130/80   Pulse 80   Ht 5' 4.17" (1.63 m)   Wt 232 lb (105.2 kg)   SpO2 95%   BMI 39.61 kg/m   Wt Readings from Last 3 Encounters:  06/03/23 229 lb 8 oz (104.1 kg)  06/01/23 239 lb 13.8 oz (108.8 kg)  05/15/23 240 lb (108.9 kg)    General: Appears her stated age, obese, in NAD. Cardiovascular: Normal rate and rhythm. S1,S2 noted.  No murmur, rubs or gallops noted.  Pulmonary/Chest: Normal effort and positive vesicular breath sounds. No respiratory distress. No wheezes, rales or ronchi noted.  Musculoskeletal:  No difficulty with gait.  Neurological: Alert and oriented. Coordination normal.    BMET    Component Value Date/Time   NA 141 06/05/2023 0855   K 3.0 (L) 06/05/2023 0855   CL 101 06/05/2023 0855   CO2 31 06/05/2023 0855   GLUCOSE 98 06/05/2023 0855   BUN 14 06/05/2023 0855   CREATININE 0.91 06/05/2023 0855   CREATININE 0.79 01/28/2023 0000   CALCIUM 8.9 06/05/2023 0855   GFRNONAA >60 06/05/2023 0855   GFRNONAA 74 08/17/2020 0814   GFRAA 86 08/17/2020 0814    Lipid Panel     Component Value Date/Time   CHOL 158 01/28/2023 0000   TRIG 124 01/28/2023 0000   HDL 51 01/28/2023 0000   CHOLHDL 3.1 01/28/2023 0000   LDLCALC 84 01/28/2023 0000    CBC    Component Value Date/Time    WBC 17.7 (H) 06/05/2023 0855   RBC 4.48 06/05/2023 0855   HGB 10.6 (L) 06/05/2023 0855   HCT 34.0 (L) 06/05/2023 0855   PLT 488 (H) 06/05/2023 0855   MCV 75.9 (L) 06/05/2023 0855   MCH 23.7 (L) 06/05/2023 0855   MCHC 31.2 06/05/2023 0855   RDW 17.5 (H) 06/05/2023 0855   LYMPHSABS 4.5 (H) 05/11/2023 0601   MONOABS 0.9 05/11/2023 0601   EOSABS 0.1 05/11/2023 0601   BASOSABS 0.1 05/11/2023 0601    Hgb A1C Lab Results  Component Value Date   HGBA1C 6.3 (H) 01/28/2023           Assessment & Plan:   Hospital follow-up for multiple left-sided rib fractures, pleural effusion status postthoracentesis, acute hypoxic respiratory failure:  Hospital notes, labs and imaging reviewed Symptoms have essentially resolved She will followup with pulmonology as scheduled Continue current medications  RTC in 1 month for your annual exam Nicki Reaper, NP

## 2023-06-19 ENCOUNTER — Emergency Department
Admission: EM | Admit: 2023-06-19 | Discharge: 2023-06-20 | Disposition: A | Discharge status: Home / Self Care | Payer: 59 | Attending: Emergency Medicine | Admitting: Emergency Medicine

## 2023-06-19 ENCOUNTER — Emergency Department: Payer: 59

## 2023-06-19 ENCOUNTER — Encounter (HOSPITAL_COMMUNITY): Payer: Self-pay

## 2023-06-19 ENCOUNTER — Emergency Department
Admission: EM | Admit: 2023-06-19 | Discharge: 2023-06-19 | Disposition: A | Discharge status: Home / Self Care | Payer: 59 | Attending: Emergency Medicine | Admitting: Emergency Medicine

## 2023-06-19 ENCOUNTER — Ambulatory Visit: Payer: 59 | Admitting: Internal Medicine

## 2023-06-19 ENCOUNTER — Encounter: Payer: Self-pay | Admitting: *Deleted

## 2023-06-19 ENCOUNTER — Other Ambulatory Visit: Payer: Self-pay

## 2023-06-19 DIAGNOSIS — J189 Pneumonia, unspecified organism: Secondary | ICD-10-CM

## 2023-06-19 DIAGNOSIS — Z743 Need for continuous supervision: Secondary | ICD-10-CM | POA: Diagnosis not present

## 2023-06-19 DIAGNOSIS — J9811 Atelectasis: Secondary | ICD-10-CM | POA: Insufficient documentation

## 2023-06-19 DIAGNOSIS — W19XXXA Unspecified fall, initial encounter: Secondary | ICD-10-CM | POA: Diagnosis not present

## 2023-06-19 DIAGNOSIS — R0600 Dyspnea, unspecified: Secondary | ICD-10-CM | POA: Diagnosis not present

## 2023-06-19 DIAGNOSIS — R918 Other nonspecific abnormal finding of lung field: Secondary | ICD-10-CM | POA: Diagnosis not present

## 2023-06-19 DIAGNOSIS — J90 Pleural effusion, not elsewhere classified: Secondary | ICD-10-CM | POA: Diagnosis not present

## 2023-06-19 DIAGNOSIS — I700 Atherosclerosis of aorta: Secondary | ICD-10-CM | POA: Insufficient documentation

## 2023-06-19 DIAGNOSIS — J181 Lobar pneumonia, unspecified organism: Secondary | ICD-10-CM | POA: Insufficient documentation

## 2023-06-19 DIAGNOSIS — S2242XD Multiple fractures of ribs, left side, subsequent encounter for fracture with routine healing: Secondary | ICD-10-CM | POA: Insufficient documentation

## 2023-06-19 DIAGNOSIS — S2242XA Multiple fractures of ribs, left side, initial encounter for closed fracture: Secondary | ICD-10-CM | POA: Diagnosis not present

## 2023-06-19 DIAGNOSIS — Z9981 Dependence on supplemental oxygen: Secondary | ICD-10-CM | POA: Insufficient documentation

## 2023-06-19 DIAGNOSIS — J9601 Acute respiratory failure with hypoxia: Secondary | ICD-10-CM | POA: Diagnosis not present

## 2023-06-19 DIAGNOSIS — R0602 Shortness of breath: Secondary | ICD-10-CM | POA: Diagnosis not present

## 2023-06-19 DIAGNOSIS — W19XXXD Unspecified fall, subsequent encounter: Secondary | ICD-10-CM | POA: Insufficient documentation

## 2023-06-19 DIAGNOSIS — R062 Wheezing: Secondary | ICD-10-CM | POA: Diagnosis not present

## 2023-06-19 DIAGNOSIS — I10 Essential (primary) hypertension: Secondary | ICD-10-CM | POA: Insufficient documentation

## 2023-06-19 DIAGNOSIS — J811 Chronic pulmonary edema: Secondary | ICD-10-CM | POA: Diagnosis not present

## 2023-06-19 LAB — CBC WITH DIFFERENTIAL/PLATELET
Abs Immature Granulocytes: 0.04 10*3/uL (ref 0.00–0.07)
Basophils Absolute: 0.1 10*3/uL (ref 0.0–0.1)
Basophils Relative: 0 %
Eosinophils Absolute: 0.6 10*3/uL — ABNORMAL HIGH (ref 0.0–0.5)
Eosinophils Relative: 5 %
HCT: 37.3 % (ref 36.0–46.0)
Hemoglobin: 11.4 g/dL — ABNORMAL LOW (ref 12.0–15.0)
Immature Granulocytes: 0 %
Lymphocytes Relative: 18 %
Lymphs Abs: 2.2 10*3/uL (ref 0.7–4.0)
MCH: 23.5 pg — ABNORMAL LOW (ref 26.0–34.0)
MCHC: 30.6 g/dL (ref 30.0–36.0)
MCV: 76.7 fL — ABNORMAL LOW (ref 80.0–100.0)
Monocytes Absolute: 0.6 10*3/uL (ref 0.1–1.0)
Monocytes Relative: 5 %
Neutro Abs: 8.7 10*3/uL — ABNORMAL HIGH (ref 1.7–7.7)
Neutrophils Relative %: 72 %
Platelets: 524 10*3/uL — ABNORMAL HIGH (ref 150–400)
RBC: 4.86 MIL/uL (ref 3.87–5.11)
RDW: 17.4 % — ABNORMAL HIGH (ref 11.5–15.5)
WBC: 12.2 10*3/uL — ABNORMAL HIGH (ref 4.0–10.5)
nRBC: 0 % (ref 0.0–0.2)

## 2023-06-19 LAB — COMPREHENSIVE METABOLIC PANEL
ALT: 18 U/L (ref 0–44)
AST: 21 U/L (ref 15–41)
Albumin: 4 g/dL (ref 3.5–5.0)
Alkaline Phosphatase: 84 U/L (ref 38–126)
Anion gap: 12 (ref 5–15)
BUN: 6 mg/dL (ref 6–20)
CO2: 26 mmol/L (ref 22–32)
Calcium: 9.2 mg/dL (ref 8.9–10.3)
Chloride: 102 mmol/L (ref 98–111)
Creatinine, Ser: 0.84 mg/dL (ref 0.44–1.00)
GFR, Estimated: >60 mL/min (ref >60)
Glucose, Bld: 143 mg/dL — ABNORMAL HIGH (ref 70–99)
Potassium: 3.2 mmol/L — ABNORMAL LOW (ref 3.5–5.1)
Sodium: 140 mmol/L (ref 135–145)
Total Bilirubin: 0.4 mg/dL (ref <1.2)
Total Protein: 7.9 g/dL (ref 6.5–8.1)

## 2023-06-19 LAB — TROPONIN I (HIGH SENSITIVITY): Troponin I (High Sensitivity): 346 ng/L (ref <18)

## 2023-06-19 LAB — PROTIME-INR
INR: 1 (ref 0.8–1.2)
Prothrombin Time: 13.3 s (ref 11.4–15.2)

## 2023-06-19 LAB — LACTIC ACID, PLASMA
Lactic Acid, Venous: 2.3 mmol/L (ref 0.5–1.9)
Lactic Acid, Venous: 4.1 mmol/L (ref 0.5–1.9)

## 2023-06-19 LAB — PROCALCITONIN: Procalcitonin: <0.1 ng/mL

## 2023-06-19 LAB — BRAIN NATRIURETIC PEPTIDE: B Natriuretic Peptide: 54.2 pg/mL (ref 0.0–100.0)

## 2023-06-19 LAB — APTT: aPTT: 27 s (ref 24–36)

## 2023-06-19 MED ORDER — HEPARIN BOLUS VIA INFUSION
4000.0000 [IU] | Freq: Once | INTRAVENOUS | Status: AC
Start: 2023-06-19 — End: 2023-06-19
  Administered 2023-06-19: 4000 [IU] via INTRAVENOUS
  Filled 2023-06-19: qty 4000

## 2023-06-19 MED ORDER — ASPIRIN 81 MG PO CHEW
324.0000 mg | CHEWABLE_TABLET | Freq: Once | ORAL | Status: AC
  Administered 2023-06-19: 324 mg via ORAL
  Filled 2023-06-19: qty 4

## 2023-06-19 MED ORDER — PIPERACILLIN-TAZOBACTAM 3.375 G IVPB
3.3750 g | Freq: Once | INTRAVENOUS | Status: AC
  Administered 2023-06-19: 3.375 g via INTRAVENOUS
  Filled 2023-06-19: qty 50

## 2023-06-19 MED ORDER — ALBUTEROL SULFATE (2.5 MG/3ML) 0.083% IN NEBU
2.5000 mg | INHALATION_SOLUTION | Freq: Once | RESPIRATORY_TRACT | Status: AC
  Administered 2023-06-19: 2.5 mg via RESPIRATORY_TRACT
  Filled 2023-06-19: qty 3

## 2023-06-19 MED ORDER — METHYLPREDNISOLONE SODIUM SUCC 125 MG IJ SOLR
125.0000 mg | Freq: Once | INTRAMUSCULAR | Status: AC
  Administered 2023-06-19: 125 mg via INTRAVENOUS
  Filled 2023-06-19: qty 2

## 2023-06-19 MED ORDER — VANCOMYCIN HCL 1500 MG/300ML IV SOLN
1500.0000 mg | Freq: Once | INTRAVENOUS | Status: AC
  Administered 2023-06-20: 1500 mg via INTRAVENOUS
  Filled 2023-06-19: qty 300

## 2023-06-19 MED ORDER — IPRATROPIUM-ALBUTEROL 0.5-2.5 (3) MG/3ML IN SOLN
3.0000 mL | Freq: Once | RESPIRATORY_TRACT | Status: AC
  Administered 2023-06-19: 3 mL via RESPIRATORY_TRACT
  Filled 2023-06-19: qty 3

## 2023-06-19 MED ORDER — SODIUM CHLORIDE 0.9 % IV BOLUS
500.0000 mL | Freq: Once | INTRAVENOUS | Status: AC
  Administered 2023-06-19: 500 mL via INTRAVENOUS

## 2023-06-19 MED ORDER — SODIUM CHLORIDE 0.9 % IV BOLUS
1000.0000 mL | Freq: Once | INTRAVENOUS | Status: AC
  Administered 2023-06-19: 1000 mL via INTRAVENOUS

## 2023-06-19 MED ORDER — IOHEXOL 350 MG/ML SOLN
75.0000 mL | Freq: Once | INTRAVENOUS | Status: AC | PRN
  Administered 2023-06-19: 75 mL via INTRAVENOUS

## 2023-06-19 MED ORDER — HEPARIN (PORCINE) 25000 UT/250ML-% IV SOLN
1100.0000 [IU]/h | INTRAVENOUS | Status: DC
  Administered 2023-06-19: 1100 [IU]/h via INTRAVENOUS
  Filled 2023-06-19: qty 250

## 2023-06-19 MED ORDER — VANCOMYCIN HCL IN DEXTROSE 1-5 GM/200ML-% IV SOLN
1000.0000 mg | Freq: Once | INTRAVENOUS | Status: AC
  Administered 2023-06-19: 1000 mg via INTRAVENOUS
  Filled 2023-06-19: qty 200

## 2023-06-19 MED ORDER — PREDNISONE 10 MG PO TABS: ORAL_TABLET | ORAL | 0 refills | Status: DC

## 2023-06-19 MED ORDER — ALBUTEROL SULFATE (2.5 MG/3ML) 0.083% IN NEBU: 2.5000 mg | INHALATION_SOLUTION | RESPIRATORY_TRACT | 2 refills | Status: AC | PRN

## 2023-06-19 MED ORDER — DOXYCYCLINE HYCLATE 100 MG PO CAPS: 100.0000 mg | ORAL_CAPSULE | Freq: Two times a day (BID) | ORAL | 0 refills | Status: DC

## 2023-06-19 MED ORDER — POTASSIUM CHLORIDE CRYS ER 20 MEQ PO TBCR
40.0000 meq | EXTENDED_RELEASE_TABLET | Freq: Once | ORAL | Status: AC
  Administered 2023-06-19: 40 meq via ORAL
  Filled 2023-06-19: qty 2

## 2023-06-19 MED ORDER — LORAZEPAM 2 MG/ML IJ SOLN
1.0000 mg | Freq: Once | INTRAMUSCULAR | Status: AC
  Administered 2023-06-19: 1 mg via INTRAVENOUS
  Filled 2023-06-19: qty 1

## 2023-06-19 NOTE — ED Notes (Signed)
Patient transported to CT 

## 2023-06-19 NOTE — ED Provider Notes (Signed)
[facility] Provider Note    Event Date/Time   First MD Initiated Contact with Patient 06/19/23 1507     (approximate)   History   Respiratory Distress   HPI  Courtney Carrillo is a 57 y.o. female  with PMHx obesity, hypoxic resp failure, chronic pain, here with cough, SOB. Pt reportedly fell and broke several ribs recently. She has had recurrent breathing issues since then despite being on ABX, and supplemental O2 at home PRN. Over past 24 hours she has had worsening acute on chronic SOB, wheezing, and fatigue. She was seen earlier and dx with possble PNA, sent home. Over the next few hours, her SOB worsened to the point where she couldn't walk per report.  She subsequent presents back for evaluation.  She tried her inhaler without significant relief.      Physical Exam   Triage Vital Signs: ED Triage Vitals 06/19/23 1449  Encounter Vitals Group     BP (!) 182/122     Systolic BP Percentile      Diastolic BP Percentile      Pulse Rate 91     Resp (!) 26     Temp 98.2 F (36.8 C)     Temp Source Oral     SpO2 92 %     Weight 105 lb 3.8 oz (47.7 kg)     Height 5\' 4"  (1.626 m)     Head Circumference      Peak Flow      Pain Score 0     Pain Loc      Pain Education      Exclude from Growth Chart     Most recent vital signs: Vitals:   06/19/23 1500 06/19/23 1901  BP: (!) 108/91 133/75  Pulse: (!) 103 88  Resp: 20 18  Temp:  98 F (36.7 C)  SpO2: 97% 94%     General: Awake, mild respiratory distress. CV:  Good peripheral perfusion.  Tachycardia.  Regular.  No murmurs. Resp:  Slight tachypnea with globally diminished aeration.  Diffuse expiratory wheezes.  Speaking in short sentences.  Mild retractions. Abd:  No distention.  No tenderness Other:  No major lower extremity edema.   ED Results / Procedures / Treatments   Labs (all labs ordered are listed, but only abnormal results are displayed) Labs Reviewed  LACTIC ACID, PLASMA -  Abnormal; Notable for the following components:      Result Value   Lactic Acid, Venous 2.3 (*)    All other components within normal limits  LACTIC ACID, PLASMA - Abnormal; Notable for the following components:   Lactic Acid, Venous 4.1 (*)    All other components within normal limits  BLOOD GAS, VENOUS - Abnormal; Notable for the following components:   Bicarbonate 31.1 (*)    Acid-Base Excess 3.6 (*)    All other components within normal limits  TROPONIN I (HIGH SENSITIVITY) - Abnormal; Notable for the following components:   Troponin I (High Sensitivity) 346 (*)    All other components within normal limits  CULTURE, BLOOD (ROUTINE X 2)  CULTURE, BLOOD (ROUTINE X 2)  BRAIN NATRIURETIC PEPTIDE  PROCALCITONIN  PROTIME-INR  APTT  HEPARIN LEVEL (UNFRACTIONATED)  CBC     EKG Normal sinus rhythm, ventricular at 94.  PR 162, QRS 80, QTc 467.  No acute ST elevations or depressions.  No ischemia or infarct.   RADIOLOGY Chest x-ray: Small residual left pleural effusion, persistent mild opacity  in the lateral left lung CT angio: L pleural effusion with loculation, atelectasis   I also independently reviewed and agree with radiologist interpretations.   PROCEDURES:  Critical Care performed: Yes, see critical care procedure note(s)  .Critical Care  Performed by: Shaune Pollack, MD Authorized by: Shaune Pollack, MD   Critical care provider statement:    Critical care time (minutes):  30   Critical care time was exclusive of:  Separately billable procedures and treating other patients   Critical care was necessary to treat or prevent imminent or life-threatening deterioration of the following conditions:  Cardiac failure, circulatory failure, respiratory failure and sepsis   Critical care was time spent personally by me on the following activities:  Development of treatment plan with patient or surrogate, discussions with consultants, evaluation of patient's response to  treatment, examination of patient, ordering and review of laboratory studies, ordering and review of radiographic studies, ordering and performing treatments and interventions, pulse oximetry, re-evaluation of patient's condition and review of old charts     MEDICATIONS ORDERED IN ED: Medications  heparin ADULT infusion 100 units/mL (25000 units/268mL) (1,100 Units/hr Intravenous New Bag/Given 06/19/23 2027)  sodium chloride 0.9 % bolus 1,000 mL (has no administration in time range)  piperacillin-tazobactam (ZOSYN) IVPB 3.375 g (has no administration in time range)  methylPREDNISolone sodium succinate (SOLU-MEDROL) 125 mg/2 mL injection 125 mg (125 mg Intravenous Given 06/19/23 1530)  ipratropium-albuterol (DUONEB) 0.5-2.5 (3) MG/3ML nebulizer solution 3 mL (3 mLs Nebulization Given 06/19/23 1530)  ipratropium-albuterol (DUONEB) 0.5-2.5 (3) MG/3ML nebulizer solution 3 mL (3 mLs Nebulization Given 06/19/23 1530)  ipratropium-albuterol (DUONEB) 0.5-2.5 (3) MG/3ML nebulizer solution 3 mL (3 mLs Nebulization Given 06/19/23 1529)  potassium chloride SA (KLOR-CON M) CR tablet 40 mEq (40 mEq Oral Given 06/19/23 1533)  iohexol (OMNIPAQUE) 350 MG/ML injection 75 mL (75 mLs Intravenous Contrast Given 06/19/23 1732)  LORazepam (ATIVAN) injection 1 mg (1 mg Intravenous Given 06/19/23 1641)  sodium chloride 0.9 % bolus 500 mL (0 mLs Intravenous Stopped 06/19/23 2046)  heparin bolus via infusion 4,000 Units (4,000 Units Intravenous Bolus from Bag 06/19/23 2027)     IMPRESSION / MDM / ASSESSMENT AND PLAN / ED COURSE  I reviewed the triage vital signs and the nursing notes.                              Differential diagnosis includes, but is not limited to, pneumonia, bronchospasm with underlying emphysema or COPD, PE, symptomatic pleural effusion, CHF  Patient's presentation is most consistent with acute presentation with potential threat to life or bodily function.  The patient is on the cardiac monitor to  evaluate for evidence of arrhythmia and/or significant heart rate changes  57 yo F here with recurrent SOB. Pt sustained rib fx recently and has been admitted with recurrent resp issues since then. She is tachypneic, hypoxic, and in mild resp distress. Suspect recurrent PNA, vs effusion, vs PE. CXR shows PNA. CT Angio pending. Labs show no retention. Mild LA elevation noted. Procal negative. CBC, BMP from earlier today unremarkable. BNP normal. Trop, interestingly, elevated at 346. EKG nonischemic and she has no CP. Concern for possible NSTEMI vs PE. Will start on heparin, admit to medicine.   CT angio obtained, reviewed, shows possible loculated effusion on the left.  Initially discussed with the hospitalist here, Dr. Allena Katz, who recommended discussing with CT surgery given the recurrent nature of the effusion.  Discussed the case with  Dr. Cliffton Asters of CT surgery, who feels patient likely needs a chest tube, but this can be managed by pulmonology.  Discussed the case with pulmonology at [facility], will transfer to hospitalist service for admission and evaluation there, as the patient also will have access to CT surgery if needed for pleurodesis.  Patient updated and in agreement with this plan.  Lactic acid elevated which I suspect is due to increased work of breathing.  She does not appear septic clinically.  Will give empiric antibiotics for the loculated effusion while awaiting transfer.   FINAL CLINICAL IMPRESSION(S) / ED DIAGNOSES   Final diagnoses:  Pleural effusion  Compressive atelectasis  Acute respiratory failure with hypoxia (HCC)     Rx / DC Orders   ED Discharge Orders     None        Note:  This document was prepared using Dragon voice recognition software and may include unintentional dictation errors.   Shaune Pollack, MD 06/19/23 2207

## 2023-06-19 NOTE — Progress Notes (Deleted)
Subjective:    Patient ID: Courtney Carrillo, female    DOB: [birth year], 57 y.o.   MRN: [id number]  HPI    Review of Systems     Past Medical History:  Diagnosis Date   Anemia    2015   Anxiety    Depression    Headache    H/O   Hyperlipidemia    Hypertension    Sleep apnea    CPAP    Current Outpatient Medications  Medication Sig Dispense Refill   acetaminophen-codeine (TYLENOL #3) 300-30 MG tablet Take 1 tablet by mouth every 8 (eight) hours as needed for moderate pain (pain score 4-6). 20 tablet 0   albuterol (VENTOLIN HFA) 108 (90 Base) MCG/ACT inhaler Inhale 2 puffs into the lungs every 6 (six) hours as needed for wheezing or shortness of breath. 8 g 2   amLODipine (NORVASC) 10 MG tablet Take 1 tablet (10 mg total) by mouth daily. 30 tablet 11   atorvastatin (LIPITOR) 40 MG tablet TAKE 1 TABLET(40 MG) BY MOUTH EVERY MORNING 90 tablet 2   benztropine (COGENTIN) 1 MG tablet Take 1 mg by mouth every morning.     budesonide-formoterol (SYMBICORT) 160-4.5 MCG/ACT inhaler Inhale 2 puffs into the lungs 2 (two) times daily. 1 each 12   busPIRone (BUSPAR) 10 MG tablet Take 20 mg by mouth 2 (two) times daily.     carvedilol (COREG) 3.125 MG tablet Take 1 tablet (3.125 mg total) by mouth 2 (two) times daily with a meal. 60 tablet 0   doxepin (SINEQUAN) 50 MG capsule Take 50 mg by mouth at bedtime.     lidocaine (LIDODERM) 5 % Place 1 patch onto the skin every 12 (twelve) hours. Remove & Discard patch within 12 hours or as directed by MD 10 patch 0   methocarbamol (ROBAXIN) 500 MG tablet Take 1 tablet (500 mg total) by mouth every 8 (eight) hours as needed for muscle spasms. 30 tablet 0   VRAYLAR 6 MG CAPS Take 1 capsule by mouth daily.     No current facility-administered medications for this visit.    Allergies  Allergen Reactions   Lisinopril Cough   Losartan Cough   Other Other (See Comments)    Perfumes : Congestion    Family History  Problem Relation Age of Onset    Diabetes Mother    Breast cancer Sister     Social History   Socioeconomic History   Marital status: Single    Spouse name: Not on file   Number of children: Not on file   Years of education: Not on file   Highest education level: Not on file  Occupational History   Not on file  Tobacco Use   Smoking status: Former    Current packs/day: 0.50    Average packs/day: 0.5 packs/day for 35.2 years (17.6 ttl pk-yrs)    Types: Cigarettes    Start date: 09/12/2021   Smokeless tobacco: Never  Vaping Use   Vaping status: Never Used  Substance and Sexual Activity   Alcohol use: No   Drug use: Yes    Frequency: 7.0 times per week    Types: Marijuana   Sexual activity: Not on file  Other Topics Concern   Not on file  Social History Narrative   Not on file   Social Determinants of Health   Financial Resource Strain: Low Risk  (12/25/2022)   Overall Financial Resource Strain (CARDIA)    Difficulty of Paying Living Expenses:  Not hard at all  Food Insecurity: No Food Insecurity (06/03/2023)   Hunger Vital Sign    Worried About Running Out of Food in the Last Year: Never true    Ran Out of Food in the Last Year: Never true  Transportation Needs: No Transportation Needs (06/03/2023)   PRAPARE - Administrator, Civil Service (Medical): No    Lack of Transportation (Non-Medical): No  Physical Activity: Inactive (06/11/2023)   Exercise Vital Sign    Days of Exercise per Week: 0 days    Minutes of Exercise per Session: 0 min  Stress: No Stress Concern Present (12/25/2022)   [employer] of Occupational Health - Occupational Stress Questionnaire    Feeling of Stress : Not at all  Social Connections: Socially Isolated (06/11/2023)   Social Connection and Isolation Panel [NHANES]    Frequency of Communication with Friends and Family: More than three times a week    Frequency of Social Gatherings with Friends and Family: Once a week    Attends Religious Services: Never     Database administrator or Organizations: No    Attends Banker Meetings: Never    Marital Status: Never married  Intimate Partner Violence: Not At Risk (06/03/2023)   Humiliation, Afraid, Rape, and Kick questionnaire    Fear of Current or Ex-Partner: No    Emotionally Abused: No    Physically Abused: No    Sexually Abused: No     Constitutional: Denies fever, malaise, fatigue, headache or abrupt weight changes.  HEENT: Denies eye pain, eye redness, ear pain, ringing in the ears, wax buildup, runny nose, nasal congestion, bloody nose, or sore throat. Respiratory: Patient reports shortness of breath.  Denies difficulty breathing, cough or sputum production.   Cardiovascular: Denies chest pain, chest tightness, palpitations or swelling in the hands or feet.  Gastrointestinal: Denies abdominal pain, bloating, constipation, diarrhea or blood in the stool.  GU: Denies urgency, frequency, pain with urination, burning sensation, blood in urine, odor or discharge. Musculoskeletal: Denies decrease in range of motion, difficulty with gait, muscle pain or joint pain and swelling.  Skin: Denies redness, rashes, lesions or ulcercations.  Neurological: Denies dizziness, difficulty with memory, difficulty with speech or problems with balance and coordination.  Psych: Denies anxiety, depression, SI/HI.  No other specific complaints in a complete review of systems (except as listed in HPI above).  Objective:   Physical Exam  There were no vitals taken for this visit. Wt Readings from Last 3 Encounters:  06/17/23 232 lb (105.2 kg)  06/03/23 229 lb 8 oz (104.1 kg)  06/01/23 239 lb 13.8 oz (108.8 kg)    General: Appears their stated age, well developed, well nourished in NAD. Skin: Warm, dry and intact. No rashes, lesions or ulcerations noted. HEENT: Head: normal shape and size; Eyes: sclera white, no icterus, conjunctiva pink, PERRLA and EOMs intact; Ears: Tm's gray and intact, normal  light reflex; Nose: mucosa pink and moist, septum midline; Throat/Mouth: Teeth present, mucosa pink and moist, no exudate, lesions or ulcerations noted.  Neck:  Neck supple, trachea midline. No masses, lumps or thyromegaly present.  Cardiovascular: Normal rate and rhythm. S1,S2 noted.  No murmur, rubs or gallops noted. No JVD or BLE edema. No carotid bruits noted. Pulmonary/Chest: Normal effort and positive vesicular breath sounds. No respiratory distress. No wheezes, rales or ronchi noted.  Abdomen: Soft and nontender. Normal bowel sounds. No distention or masses noted. Liver, spleen and kidneys non palpable. Musculoskeletal:  Normal range of motion. No signs of joint swelling. No difficulty with gait.  Neurological: Alert and oriented. Cranial nerves II-XII grossly intact. Coordination normal.  Psychiatric: Mood and affect normal. Behavior is normal. Judgment and thought content normal.   BMET    Component Value Date/Time   NA 141 06/05/2023 0855   K 3.0 (L) 06/05/2023 0855   CL 101 06/05/2023 0855   CO2 31 06/05/2023 0855   GLUCOSE 98 06/05/2023 0855   BUN 14 06/05/2023 0855   CREATININE 0.91 06/05/2023 0855   CREATININE 0.79 01/28/2023 0000   CALCIUM 8.9 06/05/2023 0855   GFRNONAA >60 06/05/2023 0855   GFRNONAA 74 08/17/2020 0814   GFRAA 86 08/17/2020 0814    Lipid Panel     Component Value Date/Time   CHOL 158 01/28/2023 0000   TRIG 124 01/28/2023 0000   HDL 51 01/28/2023 0000   CHOLHDL 3.1 01/28/2023 0000   LDLCALC 84 01/28/2023 0000    CBC    Component Value Date/Time   WBC 17.7 (H) 06/05/2023 0855   RBC 4.48 06/05/2023 0855   HGB 10.6 (L) 06/05/2023 0855   HCT 34.0 (L) 06/05/2023 0855   PLT 488 (H) 06/05/2023 0855   MCV 75.9 (L) 06/05/2023 0855   MCH 23.7 (L) 06/05/2023 0855   MCHC 31.2 06/05/2023 0855   RDW 17.5 (H) 06/05/2023 0855   LYMPHSABS 4.5 (H) 05/11/2023 0601   MONOABS 0.9 05/11/2023 0601   EOSABS 0.1 05/11/2023 0601   BASOSABS 0.1 05/11/2023 0601     Hgb A1C Lab Results  Component Value Date   HGBA1C 6.3 (H) 01/28/2023            Assessment & Plan:     RTC in 1 month for annual exam Nicki Reaper, NP

## 2023-06-19 NOTE — ED Triage Notes (Signed)
Per EMS report, patient was on her way to her MD for a breathing treatment after c/o shortness of breath since last night. Patient is on 3L O2 at home since September after she fracture a rib. Patient has dyspnea at rest, speaks in short sentences.   122/76 106 pulse 95% 3L O2 after albuterol treatment en route.

## 2023-06-19 NOTE — Progress Notes (Signed)
PHARMACY -  BRIEF ANTIBIOTIC NOTE   Pharmacy has received consult(s) for Vancomycin from an ED provider.  The patient's profile has been reviewed for ht/wt/allergies/indication/available labs.    One time order(s) placed for Vancomycin 2500 mg IV X 1  Further antibiotics/pharmacy consults should be ordered by admitting physician if indicated.                       Thank you, Ovila Lepage D 06/19/2023  10:59 PM

## 2023-06-19 NOTE — Progress Notes (Signed)
PHARMACY -  BRIEF ANTIBIOTIC NOTE   Pharmacy has received consult(s) for Zosyn from an ED provider.  The patient's profile has been reviewed for ht/wt/allergies/indication/available labs.    One time order(s) placed for Zosyn 3.375 g IV x 1  Further antibiotics/pharmacy consults should be ordered by admitting physician if indicated.                       Thank you, Merryl Hacker, PharmD Clinical Pharmacist 06/19/2023  9:31 PM

## 2023-06-19 NOTE — Progress Notes (Signed)
PHARMACY - ANTICOAGULATION CONSULT NOTE  Pharmacy Consult for Heparin Infusion Indication: chest pain/ACS  Allergies  Allergen Reactions   Lisinopril Cough   Losartan Cough   Other Other (See Comments)    Perfumes : Congestion    Patient Measurements: Height: 5\' 4"  (162.6 cm) Weight: 123 kg (271 lb 2.7 oz) IBW/kg (Calculated) : 54.7 Heparin Dosing Weight: 84.8 kg   Vital Signs: Temp: 98.2 F (36.8 C) (11/08 1449) Temp Source: Oral (11/08 1449) BP: 108/91 (11/08 1500) Pulse Rate: 103 (11/08 1500)  Labs: Recent Labs    06/19/23 1039 06/19/23 1538  HGB 11.4*  --   HCT 37.3  --   PLT 524*  --   CREATININE 0.84  --   TROPONINIHS  --  346*    Estimated Creatinine Clearance: 95.7 mL/min (by C-G formula based on SCr of 0.84 mg/dL).   Medical History: Past Medical History:  Diagnosis Date   Anemia    2015   Anxiety    Depression    Headache    H/O   Hyperlipidemia    Hypertension    Sleep apnea    CPAP    Assessment: Patient is a 57 year old female with a past medical history of bronchitis, left pleural effusion, prediabetes, hypertension, anxiety and depression, sleep apnea, anemia and depression who was sent from her PCP for breathing treatment s/p rib fracture in September. Her labs showed elevated troponin. Pharmacy was consulted to initiate patient on heparin infusion for ACS/chest pain. Patient was not on anticoagulation prior to admission, therefore, we can proceed with monitoring via heparin levels.  Baseline INR and aPTT ordered.  No signs/symptoms of bleeding noted. Hgb 11.4. PLT 524.  Goal of Therapy:  Heparin level 0.3-0.7 units/ml Monitor platelets by anticoagulation protocol: Yes   Plan:  Give 4000 units bolus x 1 Start heparin infusion at 1100 units/hr Check anti-Xa level in 6 hours and daily while on heparin Continue to monitor H&H and platelets  Merryl Hacker, PharmD Clinical Pharmacist 06/19/2023,7:01 PM

## 2023-06-19 NOTE — ED Provider Notes (Signed)
[facility] Provider Note    Event Date/Time   First MD Initiated Contact with Patient 06/19/23 1035     (approximate)   History   Shortness of Breath   HPI  Courtney Carrillo is a 57 y.o. female presents to the ED via EMS after she was told by her PCP to come to the emergency department for a breathing treatment.  Patient states that in September she had a fracture to her rib and since that time has had some shortness of breath and has been on 3 L O2 at home since September.  Patient denies any fever, chills, nausea or vomiting.  She states that her doctor had her taking a hand-held albuterol treatment at home which she does not feel is working.  She was given an albuterol nebulizer while in the EMS truck and states that she is feeling better.  Patient has a history of bronchitis, left pleural effusion, prediabetes, hypertension, anxiety and depression, sleep apnea, anemia and depression.     Physical Exam   Triage Vital Signs: ED Triage Vitals  Encounter Vitals Group     BP 06/19/23 1023 125/84     Systolic BP Percentile --      Diastolic BP Percentile --      Pulse Rate 06/19/23 1023 98     Resp 06/19/23 1023 (!) 22     Temp 06/19/23 1023 98.2 F (36.8 C)     Temp Source 06/19/23 1023 Oral     SpO2 06/19/23 1023 95 %     Weight 06/19/23 1032 232 lb (105.2 kg)     Height 06/19/23 1032 5\' 4"  (1.626 m)     Head Circumference --      Peak Flow --      Pain Score 06/19/23 1032 0     Pain Loc --      Pain Education --      Exclude from Growth Chart --     Most recent vital signs: Vitals:   06/19/23 1023  BP: 125/84  Pulse: 98  Resp: (!) 22  Temp: 98.2 F (36.8 C)  SpO2: 95%     General: Awake, no distress.  Alert, cooperative, answers questions appropriately. CV:  Good peripheral perfusion.  Heart rate and rate and rhythm. Resp:  Normal effort.  Lungs without wheezes at this time and no rales or rhonchi are noted. Abd:  No distention.   Other:    ED Results / Procedures / Treatments   Labs (all labs ordered are listed, but only abnormal results are displayed) Labs Reviewed  COMPREHENSIVE METABOLIC PANEL - Abnormal; Notable for the following components:      Result Value   Potassium 3.2 (*)    Glucose, Bld 143 (*)    All other components within normal limits  CBC WITH DIFFERENTIAL/PLATELET - Abnormal; Notable for the following components:   WBC 12.2 (*)    Hemoglobin 11.4 (*)    MCV 76.7 (*)    MCH 23.5 (*)    RDW 17.4 (*)    Platelets 524 (*)    Neutro Abs 8.7 (*)    Eosinophils Absolute 0.6 (*)    All other components within normal limits     EKG  Vent. rate 94 BPM PR interval 162 ms QRS duration 80 ms QT/QTcB 374/467 ms P-R-T axes 72 74 50 Normal sinus rhythm Possible Anterior infarct (cited on or before 01-Jun-2023) Abnormal ECG When compared with ECG of 02-Jun-2023 10:55, Premature  ventricular complexes are no longer Present    RADIOLOGY  X-ray per radiologist shows a left lower lobe area suspicious for possible pneumonia.  Small left pleural effusion which is decreased from previous chest x-ray.   PROCEDURES:  Critical Care performed:   Procedures   MEDICATIONS ORDERED IN ED: Medications  albuterol (PROVENTIL) (2.5 MG/3ML) 0.083% nebulizer solution 2.5 mg (2.5 mg Nebulization Given 06/19/23 1146)     IMPRESSION / MDM / ASSESSMENT AND PLAN / ED COURSE  I reviewed the triage vital signs and the nursing notes.   Differential diagnosis includes, but is not limited to, pneumonia, persistent rib pain secondary to fracture, bronchitis, chronic oxygen therapy at home.  57 year old female presents to the ED requesting a albuterol treatment as her PCP has sent her to the ED for.  Patient has had history of chronic O2 use at home after a fracture to a left rib.  Patient states that handheld albuterol does not seem to be helping as much as the nebulizer machine and solution.  Patient  actually was taken off her oxygen while in the emergency department for 45 minutes and remained at an O2 sat of 95%.  Chest x-ray and lab work was discussed with patient.  She was made aware that most likely there is a small area of pneumonia.  Discussed plan of prednisone, albuterol nebulizer solution and doxycycline and patient is agreeable.  She is to follow-up with her PCP if any continued problems.      Patient's presentation is most consistent with exacerbation of chronic illness.  FINAL CLINICAL IMPRESSION(S) / ED DIAGNOSES   Final diagnoses:  Community acquired pneumonia of left lower lobe of lung     Rx / DC Orders   ED Discharge Orders          Ordered    predniSONE (DELTASONE) 10 MG tablet        06/19/23 1306    albuterol (PROVENTIL) (2.5 MG/3ML) 0.083% nebulizer solution  Every 4 hours PRN        06/19/23 1306    doxycycline (VIBRAMYCIN) 100 MG capsule  2 times daily        06/19/23 1306    For home use only DME Nebulizer machine        06/19/23 1306             Note:  This document was prepared using Dragon voice recognition software and may include unintentional dictation errors.   Tommi Rumps, PA-C 06/19/23 1537    Merwyn Katos, MD 06/19/23 [phone]

## 2023-06-19 NOTE — Progress Notes (Signed)
    Call from [facility] EDP to CCM team at [place] - Joneen Roach APP took the call and ater I spoke  Pleural effusion  Plan  - get pulm consult at [facility] OR if admitting to [place] , then [facility] to Admit in [place] -> Pulm consult in [place] post admit     SIGNATURE    Dr. Kalman Shan, M.D., F.C.C.P,  Pulmonary and Critical Care Medicine Staff Physician, [facility] Director - Interstitial Lung Disease  Program  Pulmonary Fibrosis [facility] at [facility] [place], [place], [address]   Pager: [phone], If no answer  -> Check AMION or Try [phone] Telephone (clinical office): [phone] Telephone (research): [phone]  9:40 PM 06/19/2023

## 2023-06-19 NOTE — ED Triage Notes (Signed)
Pt sts that she was dx with pneumonia four hrs ago and was sent home. Pt comes back into the ED mouth breathing, sweaty and using her in hailer. Pt sts I can't breath.

## 2023-06-19 NOTE — ED Notes (Signed)
Pt assisted to get into car with daughter. Pt in car and care discussed. Pt then states I still can't breath and had turned her O2 up to 5L. Pt states I got to go back inside. Pt instructed this RN will get wheelchair. Pt assisted into wheelchair. Pt unable to sit still. Pt instructed to slow down breathing and to not stand up. Pt states Im about to pee myself. Pt continuing to try and move around. Pt wheeled back into lobby and taken to triage 2. Charge Rn called for room for pt given situation.  Rn Apolinar Junes informed to take pt to 5hall.

## 2023-06-19 NOTE — Progress Notes (Incomplete)
Plan of Care Note for accepted transfer   Patient: Courtney Carrillo MRN: [id number]   DOA: 06/19/2023  Facility requesting transfer: [facility]  Requesting Provider: Shaune Pollack, MD  Reason for transfer: AHRF, complex parapneumoic effusion, NSTEMI  Facility course:   67 F with hx recent pneumonia and parapneumonic left-sided pleural effusion, HTN, HLD, sleep apnea on CPAP at bedtime, anxiety depression. Initial effusion after fall / rib Fx, pna  with exudative effusion. More recently 10/22 admission repeat thoracentesis with bloody exudate. More dyspneic in the past few days, requiring 3L at home. Initial procal undetectable. WBC 12 down from prior, Troponin 346, EKG without overt ischemic change. Lactate 2.3 -> 4.1. EDP thinks lactate due to increased WOB, but feels adequately controlled on HFNC and does not feel needs BiPAP currently. Currently on HFNC 45 L / 50%. CTA PE negative for PE, shows persistent L sided pleural effusion which overall is decreased in size but moderate volume and loculated anteriorly, associated atelectasis. EDP Discussed with Dr. Cliffton Asters with CT surgery who thinks may need chest tube but this can be managed by pulmonology. Per ED at [facility] pulmonology does not manage chest tubes, so would like for transfer to [facility] for pulmonology and availability of CT surgery if were to need during hospitalization. They spoke with PCCM Joneen Roach as well who recommended pulm consult here if transferred. Overall I am concerned her respiratory status may be out of proportion to the complex parapneumonic effusion v empysema and assoc atelectasis, and I am concerned about possibility of a primary cardiac process / NSTEMI as alternative diagnosis.   Advised EDP to repeat troponin, give additional 1 L IVF and repeat lactate. He has started her on Zosyn. Advised to add Vancomycin. EDP starting on heparin gtt. I advised to also load with aspirin 324 mg x1.   I notified cardiology fellow Dr. Orson Aloe of  case. Please page pulmonology and cardiology once she arrives.    Plan of care: The patient is accepted for admission to Progressive unit, at [facility]..   Author: Dolly Rias, MD 06/19/2023  Check [email or web address] for on-call coverage.  Nursing staff, Please call [facility] Admits & Consults System-Wide number on Amion as soon as patient's arrival, so appropriate admitting provider can evaluate the pt.

## 2023-06-19 NOTE — Discharge Instructions (Signed)
Follow-up with your primary care provider if any continued problems or concerns.  Return to the emergency department if any severe worsening of your symptoms such as difficulty breathing or shortness of breath.  A prescription for a nebulizer machine is attached to your discharge papers along with medication that was sent to the pharmacy for you to begin taking.  The prednisone is once a day for the next 5 days, doxycycline and albuterol nebulizer solution.  Continue using your incentive spirometer at home every 1-2 hours.  Continue taking your regular medication as prescribed by your doctor.

## 2023-06-20 ENCOUNTER — Inpatient Hospital Stay (HOSPITAL_COMMUNITY)
Admission: EM | Admit: 2023-06-20 | Discharge: 2023-06-23 | DRG: 871 | Disposition: A | Discharge status: Home / Self Care | Payer: 59 | Source: Other Acute Inpatient Hospital | Attending: Family Medicine | Admitting: Family Medicine

## 2023-06-20 ENCOUNTER — Other Ambulatory Visit: Payer: Self-pay

## 2023-06-20 ENCOUNTER — Inpatient Hospital Stay (HOSPITAL_COMMUNITY): Payer: 59

## 2023-06-20 ENCOUNTER — Encounter (HOSPITAL_COMMUNITY): Payer: Self-pay | Admitting: Internal Medicine

## 2023-06-20 DIAGNOSIS — I160 Hypertensive urgency: Secondary | ICD-10-CM | POA: Diagnosis present

## 2023-06-20 DIAGNOSIS — S2232XA Fracture of one rib, left side, initial encounter for closed fracture: Secondary | ICD-10-CM | POA: Diagnosis not present

## 2023-06-20 DIAGNOSIS — Z7952 Long term (current) use of systemic steroids: Secondary | ICD-10-CM

## 2023-06-20 DIAGNOSIS — Z9981 Dependence on supplemental oxygen: Secondary | ICD-10-CM | POA: Diagnosis not present

## 2023-06-20 DIAGNOSIS — E669 Obesity, unspecified: Secondary | ICD-10-CM | POA: Diagnosis present

## 2023-06-20 DIAGNOSIS — G4733 Obstructive sleep apnea (adult) (pediatric): Secondary | ICD-10-CM | POA: Diagnosis present

## 2023-06-20 DIAGNOSIS — J9811 Atelectasis: Secondary | ICD-10-CM | POA: Diagnosis present

## 2023-06-20 DIAGNOSIS — J90 Pleural effusion, not elsewhere classified: Secondary | ICD-10-CM | POA: Diagnosis not present

## 2023-06-20 DIAGNOSIS — J9601 Acute respiratory failure with hypoxia: Secondary | ICD-10-CM | POA: Diagnosis not present

## 2023-06-20 DIAGNOSIS — F32A Depression, unspecified: Secondary | ICD-10-CM | POA: Diagnosis present

## 2023-06-20 DIAGNOSIS — I21A1 Myocardial infarction type 2: Secondary | ICD-10-CM | POA: Diagnosis present

## 2023-06-20 DIAGNOSIS — I10 Essential (primary) hypertension: Secondary | ICD-10-CM | POA: Diagnosis present

## 2023-06-20 DIAGNOSIS — E782 Mixed hyperlipidemia: Secondary | ICD-10-CM | POA: Diagnosis not present

## 2023-06-20 DIAGNOSIS — D75839 Thrombocytosis, unspecified: Secondary | ICD-10-CM | POA: Diagnosis present

## 2023-06-20 DIAGNOSIS — J918 Pleural effusion in other conditions classified elsewhere: Secondary | ICD-10-CM | POA: Diagnosis present

## 2023-06-20 DIAGNOSIS — Z4682 Encounter for fitting and adjustment of non-vascular catheter: Secondary | ICD-10-CM

## 2023-06-20 DIAGNOSIS — D509 Iron deficiency anemia, unspecified: Secondary | ICD-10-CM | POA: Diagnosis present

## 2023-06-20 DIAGNOSIS — E7800 Pure hypercholesterolemia, unspecified: Secondary | ICD-10-CM | POA: Diagnosis present

## 2023-06-20 DIAGNOSIS — Z7951 Long term (current) use of inhaled steroids: Secondary | ICD-10-CM

## 2023-06-20 DIAGNOSIS — F419 Anxiety disorder, unspecified: Secondary | ICD-10-CM | POA: Diagnosis present

## 2023-06-20 DIAGNOSIS — Z87891 Personal history of nicotine dependence: Secondary | ICD-10-CM

## 2023-06-20 DIAGNOSIS — Z6838 Body mass index (BMI) 38.0-38.9, adult: Secondary | ICD-10-CM

## 2023-06-20 DIAGNOSIS — R7989 Other specified abnormal findings of blood chemistry: Secondary | ICD-10-CM | POA: Diagnosis not present

## 2023-06-20 DIAGNOSIS — Z833 Family history of diabetes mellitus: Secondary | ICD-10-CM | POA: Diagnosis not present

## 2023-06-20 DIAGNOSIS — Z23 Encounter for immunization: Secondary | ICD-10-CM | POA: Diagnosis not present

## 2023-06-20 DIAGNOSIS — E66812 Obesity, class 2: Secondary | ICD-10-CM | POA: Diagnosis present

## 2023-06-20 DIAGNOSIS — S2242XD Multiple fractures of ribs, left side, subsequent encounter for fracture with routine healing: Secondary | ICD-10-CM | POA: Diagnosis not present

## 2023-06-20 DIAGNOSIS — F17210 Nicotine dependence, cigarettes, uncomplicated: Secondary | ICD-10-CM | POA: Diagnosis present

## 2023-06-20 DIAGNOSIS — Z888 Allergy status to other drugs, medicaments and biological substances status: Secondary | ICD-10-CM | POA: Diagnosis not present

## 2023-06-20 DIAGNOSIS — J181 Lobar pneumonia, unspecified organism: Secondary | ICD-10-CM | POA: Diagnosis not present

## 2023-06-20 DIAGNOSIS — I700 Atherosclerosis of aorta: Secondary | ICD-10-CM | POA: Diagnosis not present

## 2023-06-20 DIAGNOSIS — R079 Chest pain, unspecified: Secondary | ICD-10-CM | POA: Diagnosis not present

## 2023-06-20 DIAGNOSIS — A419 Sepsis, unspecified organism: Secondary | ICD-10-CM | POA: Diagnosis present

## 2023-06-20 DIAGNOSIS — Z79899 Other long term (current) drug therapy: Secondary | ICD-10-CM | POA: Diagnosis not present

## 2023-06-20 DIAGNOSIS — R0602 Shortness of breath: Secondary | ICD-10-CM | POA: Diagnosis present

## 2023-06-20 DIAGNOSIS — J9621 Acute and chronic respiratory failure with hypoxia: Secondary | ICD-10-CM | POA: Diagnosis present

## 2023-06-20 DIAGNOSIS — F411 Generalized anxiety disorder: Secondary | ICD-10-CM | POA: Diagnosis present

## 2023-06-20 LAB — LACTATE DEHYDROGENASE, PLEURAL OR PERITONEAL FLUID: LD, Fluid: 205 U/L — ABNORMAL HIGH (ref 3–23)

## 2023-06-20 LAB — CBC
HCT: 34 % — ABNORMAL LOW (ref 36.0–46.0)
Hemoglobin: 10.2 g/dL — ABNORMAL LOW (ref 12.0–15.0)
MCH: 23.5 pg — ABNORMAL LOW (ref 26.0–34.0)
MCHC: 30 g/dL (ref 30.0–36.0)
MCV: 78.3 fL — ABNORMAL LOW (ref 80.0–100.0)
Platelets: 488 10*3/uL — ABNORMAL HIGH (ref 150–400)
RBC: 4.34 MIL/uL (ref 3.87–5.11)
RDW: 17.5 % — ABNORMAL HIGH (ref 11.5–15.5)
WBC: 17.5 10*3/uL — ABNORMAL HIGH (ref 4.0–10.5)
nRBC: 0 % (ref 0.0–0.2)

## 2023-06-20 LAB — LACTIC ACID, PLASMA
Lactic Acid, Venous: 1 mmol/L (ref 0.5–1.9)
Lactic Acid, Venous: 1.3 mmol/L (ref 0.5–1.9)

## 2023-06-20 LAB — BASIC METABOLIC PANEL
Anion gap: 7 (ref 5–15)
BUN: 12 mg/dL (ref 6–20)
CO2: 27 mmol/L (ref 22–32)
Calcium: 9.4 mg/dL (ref 8.9–10.3)
Chloride: 106 mmol/L (ref 98–111)
Creatinine, Ser: 0.69 mg/dL (ref 0.44–1.00)
GFR, Estimated: >60 mL/min (ref >60)
Glucose, Bld: 119 mg/dL — ABNORMAL HIGH (ref 70–99)
Potassium: 4.8 mmol/L (ref 3.5–5.1)
Sodium: 140 mmol/L (ref 135–145)

## 2023-06-20 LAB — HEPARIN LEVEL (UNFRACTIONATED)
Heparin Unfractionated: 0.46 [IU]/mL (ref 0.30–0.70)
Heparin Unfractionated: 0.45 [IU]/mL (ref 0.30–0.70)

## 2023-06-20 LAB — BODY FLUID CELL COUNT WITH DIFFERENTIAL
Eos, Fluid: 0 %
Lymphs, Fluid: 96 %
Monocyte-Macrophage-Serous Fluid: 1 % — ABNORMAL LOW (ref 50–90)
Neutrophil Count, Fluid: 3 % (ref 0–25)
Total Nucleated Cell Count, Fluid: 5705 uL — ABNORMAL HIGH (ref 0–1000)

## 2023-06-20 LAB — PROTEIN, PLEURAL OR PERITONEAL FLUID: Total protein, fluid: 5 g/dL

## 2023-06-20 LAB — GLUCOSE, PLEURAL OR PERITONEAL FLUID: Glucose, Fluid: 112 mg/dL

## 2023-06-20 LAB — GRAM STAIN

## 2023-06-20 LAB — PROTEIN, TOTAL: Total Protein: 7.2 g/dL (ref 6.5–8.1)

## 2023-06-20 LAB — TROPONIN I (HIGH SENSITIVITY)
Troponin I (High Sensitivity): 428 ng/L (ref <18)
Troponin I (High Sensitivity): 153 ng/L

## 2023-06-20 LAB — ALBUMIN, PLEURAL OR PERITONEAL FLUID: Albumin, Fluid: 2.5 g/dL

## 2023-06-20 LAB — LACTATE DEHYDROGENASE: LDH: 196 U/L — ABNORMAL HIGH (ref 98–192)

## 2023-06-20 LAB — MAGNESIUM: Magnesium: 2.2 mg/dL (ref 1.7–2.4)

## 2023-06-20 LAB — ALBUMIN: Albumin: 3.4 g/dL — ABNORMAL LOW (ref 3.5–5.0)

## 2023-06-20 MED ORDER — ACETAMINOPHEN 325 MG PO TABS
650.0000 mg | ORAL_TABLET | Freq: Four times a day (QID) | ORAL | Status: DC | PRN
  Administered 2023-06-20 – 2023-06-21 (×2): 650 mg via ORAL
  Filled 2023-06-20 (×3): qty 2

## 2023-06-20 MED ORDER — DOXEPIN HCL 25 MG PO CAPS
50.0000 mg | ORAL_CAPSULE | Freq: Every day | ORAL | Status: DC
  Administered 2023-06-21 – 2023-06-22 (×2): 50 mg via ORAL
  Filled 2023-06-20: qty 1
  Filled 2023-06-20 (×4): qty 2

## 2023-06-20 MED ORDER — BUSPIRONE HCL 5 MG PO TABS
20.0000 mg | ORAL_TABLET | Freq: Two times a day (BID) | ORAL | Status: DC
  Administered 2023-06-20 – 2023-06-23 (×6): 20 mg via ORAL
  Filled 2023-06-20 (×6): qty 4

## 2023-06-20 MED ORDER — HEPARIN (PORCINE) 25000 UT/250ML-% IV SOLN: 1100.0000 [IU]/h | INTRAVENOUS | Status: DC

## 2023-06-20 MED ORDER — BENZTROPINE MESYLATE 1 MG PO TABS
1.0000 mg | ORAL_TABLET | Freq: Every morning | ORAL | Status: DC
  Administered 2023-06-21 – 2023-06-23 (×3): 1 mg via ORAL
  Filled 2023-06-20 (×3): qty 1

## 2023-06-20 MED ORDER — ONDANSETRON HCL 4 MG/2ML IJ SOLN: 4.0000 mg | Freq: Four times a day (QID) | INTRAMUSCULAR | Status: DC | PRN

## 2023-06-20 MED ORDER — ONDANSETRON HCL 4 MG PO TABS: 4.0000 mg | ORAL_TABLET | Freq: Four times a day (QID) | ORAL | Status: DC | PRN

## 2023-06-20 MED ORDER — SODIUM CHLORIDE 0.9% FLUSH
10.0000 mL | Freq: Three times a day (TID) | INTRAVENOUS | Status: DC
  Administered 2023-06-20 – 2023-06-22 (×6): 10 mL via INTRAPLEURAL

## 2023-06-20 MED ORDER — PNEUMOCOCCAL 20-VAL CONJ VACC 0.5 ML IM SUSY
0.5000 mL | PREFILLED_SYRINGE | INTRAMUSCULAR | Status: AC
  Administered 2023-06-23: 0.5 mL via INTRAMUSCULAR
  Filled 2023-06-20: qty 0.5

## 2023-06-20 MED ORDER — SODIUM CHLORIDE 0.9% FLUSH
3.0000 mL | Freq: Two times a day (BID) | INTRAVENOUS | Status: DC
  Administered 2023-06-20 – 2023-06-23 (×6): 3 mL via INTRAVENOUS

## 2023-06-20 MED ORDER — CARIPRAZINE HCL 1.5 MG PO CAPS
6.0000 mg | ORAL_CAPSULE | Freq: Every day | ORAL | Status: DC
  Administered 2023-06-21 – 2023-06-23 (×3): 6 mg via ORAL
  Filled 2023-06-20: qty 2
  Filled 2023-06-20 (×3): qty 4

## 2023-06-20 MED ORDER — AMLODIPINE BESYLATE 10 MG PO TABS
10.0000 mg | ORAL_TABLET | Freq: Every day | ORAL | Status: DC
  Administered 2023-06-21 – 2023-06-23 (×3): 10 mg via ORAL
  Filled 2023-06-20 (×3): qty 1

## 2023-06-20 MED ORDER — HEPARIN SODIUM (PORCINE) 5000 UNIT/ML IJ SOLN
5000.0000 [IU] | Freq: Three times a day (TID) | INTRAMUSCULAR | Status: DC
  Administered 2023-06-20 – 2023-06-23 (×8): 5000 [IU] via SUBCUTANEOUS
  Filled 2023-06-20 (×8): qty 1

## 2023-06-20 MED ORDER — PIPERACILLIN-TAZOBACTAM 3.375 G IVPB
3.3750 g | Freq: Three times a day (TID) | INTRAVENOUS | Status: DC
  Administered 2023-06-20 – 2023-06-23 (×9): 3.375 g via INTRAVENOUS
  Filled 2023-06-20 (×9): qty 50

## 2023-06-20 MED ORDER — CARVEDILOL 3.125 MG PO TABS
3.1250 mg | ORAL_TABLET | Freq: Two times a day (BID) | ORAL | Status: DC
  Administered 2023-06-21 – 2023-06-23 (×5): 3.125 mg via ORAL
  Filled 2023-06-20 (×5): qty 1

## 2023-06-20 MED ORDER — VANCOMYCIN HCL 1500 MG/300ML IV SOLN
1500.0000 mg | INTRAVENOUS | Status: DC
  Administered 2023-06-20 – 2023-06-23 (×3): 1500 mg via INTRAVENOUS
  Filled 2023-06-20 (×3): qty 300

## 2023-06-20 MED ORDER — ACETAMINOPHEN 650 MG RE SUPP: 650.0000 mg | Freq: Four times a day (QID) | RECTAL | Status: DC | PRN

## 2023-06-20 MED ORDER — ALBUTEROL SULFATE (2.5 MG/3ML) 0.083% IN NEBU: 2.5000 mg | INHALATION_SOLUTION | RESPIRATORY_TRACT | Status: DC | PRN

## 2023-06-20 NOTE — Progress Notes (Signed)
PHARMACY - ANTICOAGULATION CONSULT NOTE  Pharmacy Consult for Heparin Infusion Indication: chest pain/ACS  Allergies  Allergen Reactions   Lisinopril Cough   Losartan Cough   Other Other (See Comments)    Perfumes : Congestion    Patient Measurements: Height: 5' 4.02" (162.6 cm) Weight: 123 kg (271 lb 2.7 oz) IBW/kg (Calculated) : 54.74 Heparin Dosing Weight: 84.8 kg   Vital Signs: Temp: 98.2 F (36.8 C) (11/09 0650) Temp Source: Oral (11/09 0650) BP: 127/83 (11/09 0815) Pulse Rate: 79 (11/09 0625)  Labs: Recent Labs    06/19/23 1039 06/19/23 1538 06/19/23 2013 06/19/23 2309 06/20/23 0243 06/20/23 0909  HGB 11.4*  --   --   --   --   --   HCT 37.3  --   --   --   --   --   PLT 524*  --   --   --   --   --   APTT  --   --  27  --   --   --   LABPROT  --   --  13.3  --   --   --   INR  --   --  1.0  --   --   --   HEPARINUNFRC  --   --   --   --  0.46 0.45  CREATININE 0.84  --   --   --   --   --   TROPONINIHS  --  346*  --  428*  --   --     Estimated Creatinine Clearance: 95.7 mL/min (by C-G formula based on SCr of 0.84 mg/dL).   Medical History: Past Medical History:  Diagnosis Date   Anemia    2015   Anxiety    Depression    Headache    H/O   Hyperlipidemia    Hypertension    Sleep apnea    CPAP    Assessment: Patient is a 57 year old female with a past medical history of bronchitis, left pleural effusion, prediabetes, hypertension, anxiety and depression, sleep apnea, anemia and depression who was sent from her PCP for breathing treatment s/p rib fracture in September. Her labs showed elevated troponin. Pharmacy was consulted to initiate patient on heparin infusion for ACS/chest pain. Patient was not on anticoagulation prior to admission, therefore, we can proceed with monitoring via heparin levels.  Baseline INR and aPTT ordered.  No signs/symptoms of bleeding noted. Hgb 11.4. PLT 524.  11/9:  HL @ 0243 = 0.46, therapeutic X 1 11/9  0909 HL  0.45, therapeutic x 2  Goal of Therapy:  Heparin level 0.3-0.7 units/ml Monitor platelets by anticoagulation protocol: Yes   Plan:  11/9  0909 HL 0.45, therapeutic x 2 - Will continue pt on current rate of 1100 units/hr and check HL with am labs, CBC daily Waiting for a bed at [facility] per ED MD note  Angelique Blonder, PharmD Clinical Pharmacist 06/20/2023,9:50 AM

## 2023-06-20 NOTE — Progress Notes (Signed)
Notified R. Smith MD of patient's arrival. He told me to call patient placement. I notified him that I called and they told me to notify hi. Will continue to monitor patient.

## 2023-06-20 NOTE — H&P (Addendum)
History and Physical    Patient: Courtney Carrillo XBM:[id number] DOB: 12-06-1965 DOA: 06/20/2023 DOS: the patient was seen and examined on 06/20/2023 PCP: Lorre Munroe, NP  Patient coming from: Transfer from [facility]  Chief Complaint: Shortness of breath  HPI: MARYLOU PASKE is a 57 y.o. female with medical history significant of hypertension, hyperlipidemia, recent pneumonia with parapneumonic effusion, anxiety, depression, obesity, and OSA who presented with complaints of shortness of breath.  Initially patient had been seen in the ED on 9/23 after having a fall back couple days prior and was found to have left-sided fifth and sixth rib fractures without pneumothorax.  She was discharged with lidocaine patch, azithromycin and Augmentin.  However, return to the hospital on 9/27 with worsening shortness of breath CT imaging at that time  noted a large pleural effusion with left lower lobe consolidation or compressive atelectasis.  She was treated at the hospital for pneumonia with exudative effusion after thoracentesis with Rocephin and azithromycin.  More recently admitted into the hospital 10/22 where she had repeat thoracentesis noted to show bloody exudate.  Patient reported being more short of breath over the last 3 days prior to arrival.  Denies having any significant fever, chills, cough, or chest pain.  She has been wheezing intermittently.   In the emergency department patient was noted to be tachycardic and tachypneic with pressures elevated up to 182/122, and O2 maintained on high flow nasal cannula oxygen at 50% FiO2.  Labs from 11/8 noted WBC 12.2, hemoglobin 11.4, platelet count 524, potassium 3.2, glucose 143,  lactic acid 2.1->4.1->153,  and high-sensitivity troponin 428->153.  CT angiogram of the chest have been obtained which noted no pulmonary embolism, left pleural effusion diminished from prior exam however moderate volume still present that was partially loculated with adjacent  compressive atelectasis of the lower lobe, and healing rib fractures.  Case have been discussed with Dr. Cliffton Asters of CT surgery who thinks patient may need a chest tube, but thought this could be managed by pulmonology.  However, pulmonology at [facility] does not manage chest tubes and therefore transfer requested to the hospitalist service here at [facility].  Patient had been given 1 L of IV fluids, vancomycin, Zosyn, and the full dose aspirin, and started on heparin prior to transport.   Review of Systems: As mentioned in the history of present illness. All other systems reviewed and are negative. Past Medical History:  Diagnosis Date   Anemia    2015   Anxiety    Depression    Headache    H/O   Hyperlipidemia    Hypertension    Sleep apnea    CPAP   Past Surgical History:  Procedure Laterality Date   BACK SURGERY     LUMBAR   CARPAL TUNNEL RELEASE Right 06/19/2015   Procedure: CARPAL TUNNEL RELEASE;  Surgeon: Kennedy Bucker, MD;  Location: [facility] ORS;  Service: Orthopedics;  Laterality: Right;   COLONOSCOPY WITH PROPOFOL N/A 01/28/2021   Procedure: COLONOSCOPY WITH PROPOFOL;  Surgeon: Toney Reil, MD;  Location: [facility] ENDOSCOPY;  Service: Gastroenterology;  Laterality: N/A;   DILATION AND CURETTAGE OF UTERUS     FLEXIBLE SIGMOIDOSCOPY N/A 04/10/2021   Procedure: FLEXIBLE SIGMOIDOSCOPY;  Surgeon: Toney Reil, MD;  Location: [facility] ENDOSCOPY;  Service: Gastroenterology;  Laterality: N/A;   Social History:  reports that she has quit smoking. Her smoking use included cigarettes. She started smoking about 21 months ago. She has a 17.6 pack-year smoking history. She has  never used smokeless tobacco. She reports current drug use. Frequency: 7.00 times per week. Drug: Marijuana. She reports that she does not drink alcohol.  Allergies  Allergen Reactions   Lisinopril Cough   Losartan Cough   Other Other (See Comments)    Perfumes : Congestion    Family History  Problem Relation  Age of Onset   Diabetes Mother    Breast cancer Sister     Prior to Admission medications   Medication Sig Start Date End Date Taking? Authorizing Provider  acetaminophen-codeine (TYLENOL #3) 300-30 MG tablet Take 1 tablet by mouth every 8 (eight) hours as needed for moderate pain (pain score 4-6). 05/28/23   Lorre Munroe, NP  albuterol (PROVENTIL) (2.5 MG/3ML) 0.083% nebulizer solution Take 3 mLs (2.5 mg total) by nebulization every 4 (four) hours as needed for wheezing or shortness of breath. 06/19/23 06/18/24  Tommi Rumps, PA-C  amLODipine (NORVASC) 10 MG tablet Take 1 tablet (10 mg total) by mouth daily. 06/05/23 06/04/24  Lucile Shutters, MD  atorvastatin (LIPITOR) 40 MG tablet TAKE 1 TABLET(40 MG) BY MOUTH EVERY MORNING 05/12/23   Baity, Salvadore Oxford, NP  benztropine (COGENTIN) 1 MG tablet Take 1 mg by mouth every morning. 12/04/20   [provider]  budesonide-formoterol (SYMBICORT) 160-4.5 MCG/ACT inhaler Inhale 2 puffs into the lungs 2 (two) times daily. 06/05/23 07/05/23  Agbata, Elwyn Lade, MD  busPIRone (BUSPAR) 10 MG tablet Take 20 mg by mouth 2 (two) times daily. 07/23/20   [provider]  carvedilol (COREG) 3.125 MG tablet Take 1 tablet (3.125 mg total) by mouth 2 (two) times daily with a meal. 06/05/23 07/05/23  Agbata, Tochukwu, MD  doxepin (SINEQUAN) 50 MG capsule Take 50 mg by mouth at bedtime. 12/04/20   [provider]  doxycycline (VIBRAMYCIN) 100 MG capsule Take 1 capsule (100 mg total) by mouth 2 (two) times daily. 06/19/23   Tommi Rumps, PA-C  lidocaine (LIDODERM) 5 % Place 1 patch onto the skin every 12 (twelve) hours. Remove & Discard patch within 12 hours or as directed by MD 05/05/23 05/04/24  Pilar Jarvis, MD  methocarbamol (ROBAXIN) 500 MG tablet Take 1 tablet (500 mg total) by mouth every 8 (eight) hours as needed for muscle spasms. 05/01/23   Lorre Munroe, NP  predniSONE (DELTASONE) 10 MG tablet Take 3 tablets once  day for 5 days  06/19/23   Bridget Hartshorn L, PA-C  VRAYLAR 6 MG CAPS Take 1 capsule by mouth daily. 07/23/20   [provider]    Physical Exam: Vitals:   06/20/23 1440  BP: (!) 161/84  Pulse: 67  Resp: 20  Temp: 98.3 F (36.8 C)  TempSrc: Oral  SpO2: 99%  Weight: 104.6 kg  Height: 5\' 5"  (1.651 m)     Constitutional: Obese middle-aged female currently in no acute distress  eyes: PERRL, lids and conjunctivae normal ENMT: Mucous membranes are moist. Posterior pharynx clear of any exudate or lesions.  Neck: normal, supple,  Respiratory: Decreased overall aeration with intermittent wheezes appreciated.  Patient currently on heated high flow nasal cannula oxygen. Cardiovascular: Regular rate and rhythm, no murmurs / rubs / gallops.   Abdomen: no tenderness, no masses palpated. Bowel sounds positive.  Musculoskeletal: no clubbing / cyanosis. No joint deformity upper and lower extremities. Good ROM, no contractures. Normal muscle tone.  Skin: no rashes, lesions, ulcers. No induration Neurologic: CN 2-12 grossly intact.  Strength 5/5 in all 4.  Psychiatric: Normal judgment and insight. Alert  and oriented x 3. Normal mood.   Data Reviewed:  EKG reveals sinus rhythm at 97 bpm with QTc 476.  Reviewed labs, imaging, and pertinent records as documented  Assessment and Plan:  Acute  respiratory failure with hypoxia Sepsis secondary to loculated effusion Patient presented with acute worsening shortness of breath over the last 3 days prior to arrival.  Noted to be tachycardic and tachypneic with white blood cell count 12.2 with initial lactic acid elevated 2.3 -> 4.1.  Requiring high flow nasal cannula oxygen maintain O2 saturations at 92%.  She has had recurrent pleural effusions and treatment after initially having fall with rib fractures back in September.  CT angiogram of the chest have been obtained which noted no pulmonary embolism, left pleural effusion diminished from prior exam however  moderate volume still present that was partially loculated with adjacent compressive atelectasis of the lower lobe, and healing rib fractures. Procalcitonin was less than 0.1.  Blood cultures had been obtained.  Patient was bolused IV fluids and given empiric antibiotic vancomycin and Zosyn.  Case had been discussed with cardiothoracic surgery who recommended possible need of chest tube placement.  Blood cultures from yesterday show no growth.  Lactic acid has since trended back to within normal limits -Admit to a progressive bed -Continuous pulse oximetry with oxygen maintain O2 saturation greater than 92% -Continue to follow blood cultures -Continue empiric antibiotics of vancomycin and Zosyn -Albuterol nebs as needed for shortness of breath/wheezing -PCCM consulted for possible need of chest tube  Elevated troponin Acute.  High-sensitivity troponins elevated up to 346 ->428 yesterday, but have since been trending down.  EKG had noted no significant ischemic changes.  Patient had been given full dose aspirin and placed on a heparin drip.  Last echocardiogram from 10/23 had noted EF to be 60 to 65% with normal diastolic parameters at that time.  Most recent troponin noted to be trending down 153.  Suspect possibly due to demand in setting acute respiratory distress, but had not been elevated prior. -Check  echocardiogram -Heparin drip discontinued for possible need of chest tube placement and placed on subcu heparin  Hypertensive urgency Blood pressures have been noted to be elevated up to 182/122. -Continue amlodipine and Coreg  Microcytic anemia Chronic.  Hemoglobin 10.2 g/dL which appears around patient's baseline.  Iron studies from 05/08/2023 have been at the lower end of normal. -Continue to monitor  Thrombocytosis Chronic.  Platelet count 488 similar to priors.  Possibly related to possible iron deficiency or reactive to above. -Continue to monitor  Anxiety and depression -Continue  doxepin, BuSpar, and Vraylar  Obesity BMI 38.36 kg/m  DVT prophylaxis: Heparin Advance Care Planning:   Code Status: Full Code    Consults: PCCM  Family Communication: None  Severity of Illness: The appropriate patient status for this patient is INPATIENT. Inpatient status is judged to be reasonable and necessary in order to provide the required intensity of service to ensure the patient's safety. The patient's presenting symptoms, physical exam findings, and initial radiographic and laboratory data in the context of their chronic comorbidities is felt to place them at high risk for further clinical deterioration. Furthermore, it is not anticipated that the patient will be medically stable for discharge from the hospital within 2 midnights of admission.   * I certify that at the point of admission it is my clinical judgment that the patient will require inpatient hospital care spanning beyond 2 midnights from the point of admission due to high  intensity of service, high risk for further deterioration and high frequency of surveillance required.*  Author: Clydie Braun, MD 06/20/2023 4:21 PM  For on call review [email or web address].

## 2023-06-20 NOTE — Progress Notes (Addendum)
Patient arrived on heparin @ 11, the bag just finished. No order in the system. Notified R. Katrinka Blazing MD about it.

## 2023-06-20 NOTE — ED Notes (Signed)
Pt repositioned and placed back on monitor. Pt denies any immediate needs. Call light within reach

## 2023-06-20 NOTE — ED Notes (Signed)
Pt departs ED with [facility]

## 2023-06-20 NOTE — ED Notes (Signed)
EMTALA Reviewed by this RN.  

## 2023-06-20 NOTE — Progress Notes (Signed)
Pharmacy Antibiotic Note  Courtney Carrillo is a 57 y.o. female admitted on 06/20/2023 with loculated pleural effusion.  Pharmacy has been consulted for vancomycin and zosyn dosing.  Plan: Vancomycin 1500mg  IV q24h - next dose at midnight - for estimated AUC 462 using SCr rounded to 0.8, Vd 0.5 Zosyn 3.375gm IV q8h (4hr extended infusions) Check vancomycin levels at steady state, goal AUC 400-550 Follow up renal function, cultures as available, clinical progress, length of tx  Height: 5\' 5"  (165.1 cm) Weight: 104.6 kg (230 lb 8 oz) IBW/kg (Calculated) : 57  Temp (24hrs), Avg:98.3 F (36.8 C), Min:98 F (36.7 C), Max:98.7 F (37.1 C)  Recent Labs  Lab 06/19/23 1039 06/19/23 1538 06/19/23 2013 06/20/23 1036 06/20/23 1240  WBC 12.2*  --   --  17.5*  --   CREATININE 0.84  --   --  0.69  --   LATICACIDVEN  --  2.3* 4.1* 1.0 1.3    Estimated Creatinine Clearance: 93.1 mL/min (by C-G formula based on SCr of 0.69 mg/dL).    Allergies  Allergen Reactions   Lisinopril Cough   Losartan Cough   Other Other (See Comments)    Perfumes : Congestion    Antimicrobials this admission: 11/8 Zosyn/Vanc x 1 11/9 Vanc >> 11/9 Zosyn >>  Dose adjustments this admission:  Microbiology results: 11/8 BCx: ngtd  Thank you for allowing pharmacy to be a part of this patient's care.  Loralee Pacas, PharmD, BCPS 06/20/2023 5:41 PM  Please check AMION for all [facility] Pharmacy phone numbers After 10:00 PM, call Main Pharmacy [phone]

## 2023-06-20 NOTE — Procedures (Signed)
Insertion of Chest Tube Procedure Note  Courtney Carrillo  [id number]  [birth year]  Date:06/20/23  Time:6:37 PM    Provider Performing: Steffanie Dunn   Procedure: Pleural Catheter Insertion w/ Imaging Guidance (53664)  Indication(s) Effusion  Consent Risks of the procedure as well as the alternatives and risks of each were explained to the patient and/or caregiver.  Consent for the procedure was obtained and is signed in the bedside chart  Anesthesia Topical only with 1% lidocaine    Time Out Verified patient identification, verified procedure, site/side was marked, verified correct patient position, special equipment/implants available, medications/allergies/relevant history reviewed, required imaging and test results available.   Sterile Technique Maximal sterile technique including full sterile barrier drape, hand hygiene, sterile gown, sterile gloves, mask, hair covering, sterile ultrasound probe cover (if used).   Procedure Description Ultrasound used to identify appropriate pleural anatomy for placement and overlying skin marked. Area of placement cleaned and draped in sterile fashion.  A 14 French pigtail pleural catheter was placed into the left pleural space using Seldinger technique. Appropriate return of fluid was obtained.  The tube was connected to atrium and placed on -20 cm H2O wall suction.   Complications/Tolerance None; patient tolerated the procedure well. Chest X-ray is ordered to verify placement.   EBL Minimal  Specimen(s) fluid- sent for cell count with diff, culture, cytology, LDH, protein  Steffanie Dunn, DO 06/20/23 6:41 PM [facility]  For contact information, see Amion. If no response to pager, please call PCCM consult pager. After hours, 7PM- 7AM, please call [facility].

## 2023-06-20 NOTE — Plan of Care (Signed)
Patient has been in her room and calm, Family will visit soon. Will continue to monitor patient.

## 2023-06-20 NOTE — Progress Notes (Signed)
PHARMACY - ANTICOAGULATION CONSULT NOTE  Pharmacy Consult for Heparin Infusion Indication: chest pain/ACS  Allergies  Allergen Reactions   Lisinopril Cough   Losartan Cough   Other Other (See Comments)    Perfumes : Congestion    Patient Measurements: Height: 5' 4.02" (162.6 cm) Weight: 123 kg (271 lb 2.7 oz) IBW/kg (Calculated) : 54.74 Heparin Dosing Weight: 84.8 kg   Vital Signs: Temp: 98.2 F (36.8 C) (11/09 0304) Temp Source: Oral (11/09 0304) BP: 119/84 (11/09 0300) Pulse Rate: 79 (11/09 0300)  Labs: Recent Labs    06/19/23 1039 06/19/23 1538 06/19/23 2013 06/19/23 2309 06/20/23 0243  HGB 11.4*  --   --   --   --   HCT 37.3  --   --   --   --   PLT 524*  --   --   --   --   APTT  --   --  27  --   --   LABPROT  --   --  13.3  --   --   INR  --   --  1.0  --   --   HEPARINUNFRC  --   --   --   --  0.46  CREATININE 0.84  --   --   --   --   TROPONINIHS  --  346*  --  428*  --     Estimated Creatinine Clearance: 95.7 mL/min (by C-G formula based on SCr of 0.84 mg/dL).   Medical History: Past Medical History:  Diagnosis Date   Anemia    2015   Anxiety    Depression    Headache    H/O   Hyperlipidemia    Hypertension    Sleep apnea    CPAP    Assessment: Patient is a 57 year old female with a past medical history of bronchitis, left pleural effusion, prediabetes, hypertension, anxiety and depression, sleep apnea, anemia and depression who was sent from her PCP for breathing treatment s/p rib fracture in September. Her labs showed elevated troponin. Pharmacy was consulted to initiate patient on heparin infusion for ACS/chest pain. Patient was not on anticoagulation prior to admission, therefore, we can proceed with monitoring via heparin levels.  Baseline INR and aPTT ordered.  No signs/symptoms of bleeding noted. Hgb 11.4. PLT 524.  Goal of Therapy:  Heparin level 0.3-0.7 units/ml Monitor platelets by anticoagulation protocol: Yes   Plan:   11/9:  HL @ 0243 = 0.46, therapeutic X 1 - Will continue pt on current rate and recheck HL in 6 hrs @ 0900.   Scherrie Gerlach, PharmD Clinical Pharmacist 06/20/2023,3:25 AM

## 2023-06-20 NOTE — ED Notes (Signed)
Called to give report

## 2023-06-20 NOTE — ED Provider Notes (Addendum)
Care assumed of patient from outgoing provider.  See their note for initial history, exam and plan.  Clinical Course as of 06/20/23 1030  Sat Jun 20, 2023  0706 High flow [place] - complicated recurrent effusions with thoracentesis. [facility].  [SM]    Clinical Course User Index [SM] Courtney Herter, MD   Evaluated this morning.  Patient states that she is feeling much better.  He had a percent on her high flow nasal cannula.  Discussed with respiratory therapy plan to down titrate to nasal cannula.  Patient continues to be on antibiotics.  Waiting for a bed at [facility].  Patient continues to be on high flow nasal cannula.  Troponin significantly elevated.  Repeat troponin obtained this morning.  Uptrending lactic acid on review of her labs ordered another lactic acid and repeat blood work this morning.  Called over to [facility] and continues to not have a bed.  Called over to [facility] to see if bed availability for possible transfer for CT surgery need.  Continues on antibiotics.  Patient feeling much better.  Patient has a bed available at [facility] and will transfer.  .Critical Care  Performed by: Courtney Herter, MD Authorized by: Courtney Herter, MD   Critical care provider statement:    Critical care time (minutes):  40   Critical care time was exclusive of:  Separately billable procedures and treating other patients   Critical care was necessary to treat or prevent imminent or life-threatening deterioration of the following conditions:  Respiratory failure and circulatory failure   Critical care was time spent personally by me on the following activities:  Development of treatment plan with patient or surrogate, discussions with consultants, evaluation of patient's response to treatment, examination of patient, ordering and review of laboratory studies, ordering and review of radiographic studies, ordering and performing treatments and interventions, pulse  oximetry, re-evaluation of patient's condition and review of old charts   Care discussed with: admitting provider and accepting provider at another facility       Courtney Herter, MD 06/20/23 1610    Courtney Herter, MD 06/20/23 1030    Courtney Herter, MD 06/20/23 1322

## 2023-06-20 NOTE — Consult Note (Signed)
NAME:  Courtney Carrillo, MRN:  [id number], DOB:  01-04-1966, LOS: 0 ADMISSION DATE:  06/20/2023, CONSULTATION DATE:  06/20/2023 REFERRING MD:  Dr. Katrinka Blazing - [facility], CHIEF COMPLAINT:  Pleural effusion    History of Present Illness:  Courtney Carrillo is a 57 y.o. female with a past medical history significant for HTN, HLD, recurrent pneumonia with parapneumonic effusion, anxiety, depression, obesity, and OSA who presented to the ED again with complaints of shortness of breath.  Of note patient was originally seen 9/23 after suffering mechanical fall resulting in left fifth and sixth rib fracture without pneumothorax stabilized in ED and discharged.  Returned to hospital 9/27 with worsening shortness of breath CT noted for left large pleural effusion, treated for pneumonia and underwent thoracentesis with bloody effusion seen.  Again admitted 10/23 with recurrent shortness of breath found to have recurrent effusion treated with repeat thoracentesis.  Both thoracentesis have been exudative per lights criteria.  Patient was admitted per hospitalist service for management of acute hypoxic respiratory failure with sepsis in the setting of loculated effusion. PCCM consulted    Pertinent  Medical History  HTN, HLD, recurrent pneumonia with parapneumonic effusion, anxiety, depression, obesity, and OSA   Significant Hospital Events: Including procedures, antibiotic start and stop dates in addition to other pertinent events   11/9 transferred to [facility] for further evaluation from pulmonology versus cardiothoracic surgery.  Pigtail chest tube placed  Interim History / Subjective:  As above  Objective   Blood pressure (!) 140/82, pulse 67, temperature 98.5 F (36.9 C), temperature source Oral, resp. rate 20, height 5\' 5"  (1.651 m), weight 104.6 kg, SpO2 99%.    FiO2 (%):  [45 %-50 %] 45 %  No intake or output data in the 24 hours ending 06/20/23 1729 Filed Weights   06/20/23 1440  Weight: 104.6 kg     Examination: General: Acute on chronic ill-appearing middle-aged female sitting up in bed in no acute distress HEENT: [place]/AT, MM pink/moist, PERRL,  Neuro: Alert and oriented x 3, nonfocal CV: s1s2 regular rate and rhythm, no murmur, rubs, or gallops,  PULM: Clear to auscultation bilaterally, no increased work of breathing, diminished left base GI: soft, bowel sounds active in all 4 quadrants, non-tender, non-distended, tolerating oral diet Extremities: warm/dry, no edema  Skin: no rashes or lesions  Resolved Hospital Problem list     Assessment & Plan:  Sepsis present on admission -Presented with tachycardia, tachypnea, leukocytosis, and positive lactic acid 2.3 Acute hypoxic respiratory failure Loculated pleural effusion P: Proceed with placement of smallbore pigtail chest tube Obtain pleural fluid including cytology and Gram stain Follow for need of tPA and dornase Continue empiric Zosyn and vancomycin Continue supplemental oxygen for sat goal greater than 92 Mobilize as able Encourage pulmonary hygiene  Best Practice (right click and "Reselect all SmartList Selections" daily)  Per primary  Labs   CBC: Recent Labs  Lab 06/19/23 1039 06/20/23 1036  WBC 12.2* 17.5*  NEUTROABS 8.7*  --   HGB 11.4* 10.2*  HCT 37.3 34.0*  MCV 76.7* 78.3*  PLT 524* 488*    Basic Metabolic Panel: Recent Labs  Lab 06/19/23 1039 06/20/23 1036  NA 140 140  K 3.2* 4.8  CL 102 106  CO2 26 27  GLUCOSE 143* 119*  BUN 6 12  CREATININE 0.84 0.69  CALCIUM 9.2 9.4  MG  --  2.2   GFR: Estimated Creatinine Clearance: 93.1 mL/min (by C-G formula based on SCr of 0.69 mg/dL). Recent  Labs  Lab 06/19/23 1039 06/19/23 1538 06/19/23 2013 06/20/23 1036 06/20/23 1240  PROCALCITON  --  <0.10  --   --   --   WBC 12.2*  --   --  17.5*  --   LATICACIDVEN  --  2.3* 4.1* 1.0 1.3    Liver Function Tests: Recent Labs  Lab 06/19/23 1039  AST 21  ALT 18  ALKPHOS 84  BILITOT 0.4  PROT  7.9  ALBUMIN 4.0   No results for input(s): "LIPASE", "AMYLASE" in the last 168 hours. No results for input(s): "AMMONIA" in the last 168 hours.  ABG    Component Value Date/Time   HCO3 31.1 (H) 06/19/2023 1538   O2SAT 44.2 06/19/2023 1538     Coagulation Profile: Recent Labs  Lab 06/19/23 2013  INR 1.0    Cardiac Enzymes: No results for input(s): "CKTOTAL", "CKMB", "CKMBINDEX", "TROPONINI" in the last 168 hours.  HbA1C: Hgb A1c MFr Bld  Date/Time Value Ref Range Status  01/28/2023 12:00 AM 6.3 (H) <5.7 % of total Hgb Final    Comment:    For someone without known diabetes, a hemoglobin  A1c value between 5.7% and 6.4% is consistent with prediabetes and should be confirmed with a  follow-up test. . For someone with known diabetes, a value <7% indicates that their diabetes is well controlled. A1c targets should be individualized based on duration of diabetes, age, comorbid conditions, and other considerations. . This assay result is consistent with an increased risk of diabetes. . Currently, no consensus exists regarding use of hemoglobin A1c for diagnosis of diabetes for children. .   07/11/2022 01:28 PM 5.9 (H) <5.7 % of total Hgb Final    Comment:    For someone without known diabetes, a hemoglobin  A1c value between 5.7% and 6.4% is consistent with prediabetes and should be confirmed with a  follow-up test. . For someone with known diabetes, a value <7% indicates that their diabetes is well controlled. A1c targets should be individualized based on duration of diabetes, age, comorbid conditions, and other considerations. . This assay result is consistent with an increased risk of diabetes. . Currently, no consensus exists regarding use of hemoglobin A1c for diagnosis of diabetes for children. .     CBG: No results for input(s): "GLUCAP" in the last 168 hours.  Review of Systems:   Please see the history of present illness. All other systems  reviewed and are negative    Past Medical History:  She,  has a past medical history of Anemia, Anxiety, Depression, Headache, Hyperlipidemia, Hypertension, and Sleep apnea.   Surgical History:   Past Surgical History:  Procedure Laterality Date   BACK SURGERY     LUMBAR   CARPAL TUNNEL RELEASE Right 06/19/2015   Procedure: CARPAL TUNNEL RELEASE;  Surgeon: Kennedy Bucker, MD;  Location: [facility] ORS;  Service: Orthopedics;  Laterality: Right;   COLONOSCOPY WITH PROPOFOL N/A 01/28/2021   Procedure: COLONOSCOPY WITH PROPOFOL;  Surgeon: Toney Reil, MD;  Location: [facility] ENDOSCOPY;  Service: Gastroenterology;  Laterality: N/A;   DILATION AND CURETTAGE OF UTERUS     FLEXIBLE SIGMOIDOSCOPY N/A 04/10/2021   Procedure: FLEXIBLE SIGMOIDOSCOPY;  Surgeon: Toney Reil, MD;  Location: [facility] ENDOSCOPY;  Service: Gastroenterology;  Laterality: N/A;     Social History:   reports that she has quit smoking. Her smoking use included cigarettes. She started smoking about 21 months ago. She has a 17.6 pack-year smoking history. She has never used smokeless tobacco. She  reports current drug use. Frequency: 7.00 times per week. Drug: Marijuana. She reports that she does not drink alcohol.   Family History:  Her family history includes Breast cancer in her sister; Diabetes in her mother.   Allergies Allergies  Allergen Reactions   Lisinopril Cough   Losartan Cough   Other Other (See Comments)    Perfumes : Congestion     Home Medications  Prior to Admission medications   Medication Sig Start Date End Date Taking? Authorizing Provider  acetaminophen-codeine (TYLENOL #3) 300-30 MG tablet Take 1 tablet by mouth every 8 (eight) hours as needed for moderate pain (pain score 4-6). 05/28/23   Lorre Munroe, NP  albuterol (PROVENTIL) (2.5 MG/3ML) 0.083% nebulizer solution Take 3 mLs (2.5 mg total) by nebulization every 4 (four) hours as needed for wheezing or shortness of breath. 06/19/23 06/18/24   Tommi Rumps, PA-C  amLODipine (NORVASC) 10 MG tablet Take 1 tablet (10 mg total) by mouth daily. 06/05/23 06/04/24  Lucile Shutters, MD  atorvastatin (LIPITOR) 40 MG tablet TAKE 1 TABLET(40 MG) BY MOUTH EVERY MORNING 05/12/23   Baity, Salvadore Oxford, NP  benztropine (COGENTIN) 1 MG tablet Take 1 mg by mouth every morning. 12/04/20   [provider]  budesonide-formoterol (SYMBICORT) 160-4.5 MCG/ACT inhaler Inhale 2 puffs into the lungs 2 (two) times daily. 06/05/23 07/05/23  Agbata, Elwyn Lade, MD  busPIRone (BUSPAR) 10 MG tablet Take 20 mg by mouth 2 (two) times daily. 07/23/20   [provider]  carvedilol (COREG) 3.125 MG tablet Take 1 tablet (3.125 mg total) by mouth 2 (two) times daily with a meal. 06/05/23 07/05/23  Agbata, Tochukwu, MD  doxepin (SINEQUAN) 50 MG capsule Take 50 mg by mouth at bedtime. 12/04/20   [provider]  doxycycline (VIBRAMYCIN) 100 MG capsule Take 1 capsule (100 mg total) by mouth 2 (two) times daily. 06/19/23   Tommi Rumps, PA-C  lidocaine (LIDODERM) 5 % Place 1 patch onto the skin every 12 (twelve) hours. Remove & Discard patch within 12 hours or as directed by MD 05/05/23 05/04/24  Pilar Jarvis, MD  methocarbamol (ROBAXIN) 500 MG tablet Take 1 tablet (500 mg total) by mouth every 8 (eight) hours as needed for muscle spasms. 05/01/23   Lorre Munroe, NP  predniSONE (DELTASONE) 10 MG tablet Take 3 tablets once  day for 5 days 06/19/23   Bridget Hartshorn L, PA-C  VRAYLAR 6 MG CAPS Take 1 capsule by mouth daily. 07/23/20   [provider]     Critical care time: NA  Kayleena Eke D. Harris, NP-C [facility] Personal contact information can be found on Amion  If no contact or response made please call [phone] 06/20/2023, 6:24 PM

## 2023-06-21 ENCOUNTER — Other Ambulatory Visit (HOSPITAL_COMMUNITY): Payer: 59

## 2023-06-21 ENCOUNTER — Inpatient Hospital Stay (HOSPITAL_COMMUNITY): Payer: 59

## 2023-06-21 DIAGNOSIS — J90 Pleural effusion, not elsewhere classified: Secondary | ICD-10-CM

## 2023-06-21 DIAGNOSIS — R0602 Shortness of breath: Secondary | ICD-10-CM

## 2023-06-21 DIAGNOSIS — I10 Essential (primary) hypertension: Secondary | ICD-10-CM

## 2023-06-21 DIAGNOSIS — Z4682 Encounter for fitting and adjustment of non-vascular catheter: Secondary | ICD-10-CM | POA: Diagnosis not present

## 2023-06-21 DIAGNOSIS — R7989 Other specified abnormal findings of blood chemistry: Secondary | ICD-10-CM | POA: Diagnosis not present

## 2023-06-21 DIAGNOSIS — Z87891 Personal history of nicotine dependence: Secondary | ICD-10-CM

## 2023-06-21 DIAGNOSIS — J9601 Acute respiratory failure with hypoxia: Secondary | ICD-10-CM

## 2023-06-21 LAB — CBC
HCT: 30.9 % — ABNORMAL LOW (ref 36.0–46.0)
Hemoglobin: 9.3 g/dL — ABNORMAL LOW (ref 12.0–15.0)
MCH: 23.3 pg — ABNORMAL LOW (ref 26.0–34.0)
MCHC: 30.1 g/dL (ref 30.0–36.0)
MCV: 77.4 fL — ABNORMAL LOW (ref 80.0–100.0)
Platelets: 459 10*3/uL — ABNORMAL HIGH (ref 150–400)
RBC: 3.99 MIL/uL (ref 3.87–5.11)
RDW: 17.6 % — ABNORMAL HIGH (ref 11.5–15.5)
WBC: 16.8 10*3/uL — ABNORMAL HIGH (ref 4.0–10.5)
nRBC: 0 % (ref 0.0–0.2)

## 2023-06-21 LAB — BASIC METABOLIC PANEL
Anion gap: 8 (ref 5–15)
BUN: 13 mg/dL (ref 6–20)
CO2: 27 mmol/L (ref 22–32)
Calcium: 9.1 mg/dL (ref 8.9–10.3)
Chloride: 103 mmol/L (ref 98–111)
Creatinine, Ser: 0.82 mg/dL (ref 0.44–1.00)
GFR, Estimated: >60 mL/min (ref >60)
Glucose, Bld: 100 mg/dL — ABNORMAL HIGH (ref 70–99)
Potassium: 4.2 mmol/L (ref 3.5–5.1)
Sodium: 138 mmol/L (ref 135–145)

## 2023-06-21 LAB — ECHOCARDIOGRAM LIMITED
Height: 65 in
S' Lateral: 2.7 cm
Weight: 3688 [oz_av]

## 2023-06-21 LAB — C-REACTIVE PROTEIN: CRP: 0.9 mg/dL (ref <1.0)

## 2023-06-21 LAB — PROCALCITONIN: Procalcitonin: <0.1 ng/mL

## 2023-06-21 LAB — SEDIMENTATION RATE: Sed Rate: 30 mm/h — ABNORMAL HIGH (ref 0–22)

## 2023-06-21 MED ORDER — OXYCODONE HCL 5 MG PO TABS
5.0000 mg | ORAL_TABLET | ORAL | Status: DC | PRN
  Administered 2023-06-21 – 2023-06-22 (×2): 5 mg via ORAL
  Filled 2023-06-21: qty 1

## 2023-06-21 MED ORDER — HYDROMORPHONE HCL 1 MG/ML IJ SOLN: 0.5000 mg | INTRAMUSCULAR | Status: DC | PRN

## 2023-06-21 MED ORDER — OXYCODONE HCL 5 MG PO TABS
2.5000 mg | ORAL_TABLET | ORAL | Status: DC | PRN
  Administered 2023-06-21: 2.5 mg via ORAL
  Filled 2023-06-21 (×2): qty 1

## 2023-06-21 NOTE — Consult Note (Addendum)
Cardiology Consultation   Patient ID: Courtney Carrillo MRN: [id number]; DOB: 10/08/1965  Admit date: 06/20/2023 Date of Consult: 06/21/2023  PCP:  Lorre Munroe, NP   [facility] Providers Cardiologist:  None        Patient Profile:   Courtney Carrillo is a 57 y.o. female with a significant past medical history of hypertension, hypercholesteremia, sleep apnea, recent mechanical fall with left rib fractures subsequently complicated with parapneumonic effusion requiring multiple thoracentesis, recurrent pneumonia, prior cigarette and marijuana use, chronic hypoxic respiratory failure requiring supplemental oxygen, bronchitis, anxiety, depression, and obesity who is being seen 06/21/2023 for the evaluation of elevated HS-troponin at the request of Dr. Katrinka Blazing.  History of Present Illness:   Courtney Carrillo states around two days ago she suddenly developed shortness of breath without chest pain.  She says she noticed herself hyperventilating.  She denies any other associated symptoms such as palpitations, dizziness, or feeling like she was going to faint.  Prior to this, she reports doing well and denied swelling in her legs, shortness of breath due to lying flat, or waking up due to shortness of breath.  Eventually, her daughter called emergency medical services and she was brought to [facility] for further evaluation.    At [facility], HS-troponin was evaluated as part of her work-up for worsening dyspnea which was 346 ng/dL (went to 578 ng/dL and last lab 469 ng/dL).  Of note, one reported blood pressure of 181/122 mmHg while at the outside hospital.  Chest CT angiography was performed and ruled out pulmonary embolism with noted partially loculated pleural effusion and compressive atelectasis.  ECG largely unchanged from prior (noted poor R-wave progression).  Patient was not initiated on medications for NSTEMI-ACS.  Patient had a chest-tube placed at  our facility for which she reports she is no longer having shortness of breath.     Past Medical History:  Diagnosis Date   Anemia    2015   Anxiety    Depression    Headache    H/O   Hyperlipidemia    Hypertension    Sleep apnea    CPAP    Past Surgical History:  Procedure Laterality Date   BACK SURGERY     LUMBAR   CARPAL TUNNEL RELEASE Right 06/19/2015   Procedure: CARPAL TUNNEL RELEASE;  Surgeon: Kennedy Bucker, MD;  Location: [facility] ORS;  Service: Orthopedics;  Laterality: Right;   COLONOSCOPY WITH PROPOFOL N/A 01/28/2021   Procedure: COLONOSCOPY WITH PROPOFOL;  Surgeon: Toney Reil, MD;  Location: [facility] ENDOSCOPY;  Service: Gastroenterology;  Laterality: N/A;   DILATION AND CURETTAGE OF UTERUS     FLEXIBLE SIGMOIDOSCOPY N/A 04/10/2021   Procedure: FLEXIBLE SIGMOIDOSCOPY;  Surgeon: Toney Reil, MD;  Location: [facility] ENDOSCOPY;  Service: Gastroenterology;  Laterality: N/A;     Home Medications:  Prior to Admission medications   Medication Sig Start Date End Date Taking? Authorizing Provider  acetaminophen-codeine (TYLENOL #3) 300-30 MG tablet Take 1 tablet by mouth every 8 (eight) hours as needed for moderate pain (pain score 4-6). 05/28/23   Lorre Munroe, NP  albuterol (PROVENTIL) (2.5 MG/3ML) 0.083% nebulizer solution Take 3 mLs (2.5 mg total) by nebulization every 4 (four) hours as needed for wheezing or shortness of breath. 06/19/23 06/18/24  Tommi Rumps, PA-C  amLODipine (NORVASC) 10 MG tablet Take 1 tablet (10 mg total) by mouth daily. 06/05/23 06/04/24  Lucile Shutters, MD  atorvastatin (LIPITOR) 40 MG tablet  TAKE 1 TABLET(40 MG) BY MOUTH EVERY MORNING 05/12/23   Lorre Munroe, NP  benztropine (COGENTIN) 1 MG tablet Take 1 mg by mouth every morning. 12/04/20   [provider]  budesonide-formoterol (SYMBICORT) 160-4.5 MCG/ACT inhaler Inhale 2 puffs into the lungs 2 (two) times daily. 06/05/23 07/05/23  Agbata, Elwyn Lade, MD  busPIRone (BUSPAR)  10 MG tablet Take 20 mg by mouth 2 (two) times daily. 07/23/20   [provider]  carvedilol (COREG) 3.125 MG tablet Take 1 tablet (3.125 mg total) by mouth 2 (two) times daily with a meal. 06/05/23 07/05/23  Agbata, Tochukwu, MD  doxepin (SINEQUAN) 50 MG capsule Take 50 mg by mouth at bedtime. 12/04/20   [provider]  doxycycline (VIBRAMYCIN) 100 MG capsule Take 1 capsule (100 mg total) by mouth 2 (two) times daily. 06/19/23   Tommi Rumps, PA-C  lidocaine (LIDODERM) 5 % Place 1 patch onto the skin every 12 (twelve) hours. Remove & Discard patch within 12 hours or as directed by MD 05/05/23 05/04/24  Pilar Jarvis, MD  methocarbamol (ROBAXIN) 500 MG tablet Take 1 tablet (500 mg total) by mouth every 8 (eight) hours as needed for muscle spasms. 05/01/23   Lorre Munroe, NP  predniSONE (DELTASONE) 10 MG tablet Take 3 tablets once  day for 5 days 06/19/23   Bridget Hartshorn L, PA-C  VRAYLAR 6 MG CAPS Take 1 capsule by mouth daily. 07/23/20   [provider]    Inpatient Medications: Scheduled Meds:  amLODipine  10 mg Oral Daily   benztropine  1 mg Oral q morning   busPIRone  20 mg Oral BID   cariprazine  6 mg Oral Daily   carvedilol  3.125 mg Oral BID WC   doxepin  50 mg Oral QHS   heparin injection (subcutaneous)  5,000 Units Subcutaneous Q8H   pneumococcal 20-valent conjugate vaccine  0.5 mL Intramuscular Tomorrow-1000   sodium chloride flush  10 mL Intrapleural Q8H   sodium chloride flush  3 mL Intravenous Q12H   Continuous Infusions:  piperacillin-tazobactam 3.375 g (06/20/23 1908)   vancomycin 1,500 mg (06/20/23 2349)   PRN Meds: acetaminophen **OR** acetaminophen, albuterol, ondansetron **OR** ondansetron (ZOFRAN) IV  Allergies:    Allergies  Allergen Reactions   Lisinopril Cough   Losartan Cough   Other Other (See Comments)    Perfumes : Congestion    Social History:   Social History   Socioeconomic History   Marital status: Single     Spouse name: Not on file   Number of children: Not on file   Years of education: Not on file   Highest education level: Not on file  Occupational History   Not on file  Tobacco Use   Smoking status: Former    Current packs/day: 0.50    Average packs/day: 0.5 packs/day for 35.2 years (17.6 ttl pk-yrs)    Types: Cigarettes    Start date: 09/12/2021   Smokeless tobacco: Never  Vaping Use   Vaping status: Never Used  Substance and Sexual Activity   Alcohol use: No   Drug use: Yes    Frequency: 7.0 times per week    Types: Marijuana   Sexual activity: Not on file  Other Topics Concern   Not on file  Social History Narrative   Not on file   Social Determinants of Health   Financial Resource Strain: Low Risk  (12/25/2022)   Overall Financial Resource Strain (CARDIA)    Difficulty of Paying Living  Expenses: Not hard at all  Food Insecurity: No Food Insecurity (06/20/2023)   Hunger Vital Sign    Worried About Running Out of Food in the Last Year: Never true    Ran Out of Food in the Last Year: Never true  Transportation Needs: No Transportation Needs (06/20/2023)   PRAPARE - Administrator, Civil Service (Medical): No    Lack of Transportation (Non-Medical): No  Physical Activity: Inactive (06/11/2023)   Exercise Vital Sign    Days of Exercise per Week: 0 days    Minutes of Exercise per Session: 0 min  Stress: No Stress Concern Present (12/25/2022)   [employer] of Occupational Health - Occupational Stress Questionnaire    Feeling of Stress : Not at all  Social Connections: Socially Isolated (06/11/2023)   Social Connection and Isolation Panel [NHANES]    Frequency of Communication with Friends and Family: More than three times a week    Frequency of Social Gatherings with Friends and Family: Once a week    Attends Religious Services: Never    Database administrator or Organizations: No    Attends Banker Meetings: Never    Marital Status: Never  married  Intimate Partner Violence: Not At Risk (06/20/2023)   Humiliation, Afraid, Rape, and Kick questionnaire    Fear of Current or Ex-Partner: No    Emotionally Abused: No    Physically Abused: No    Sexually Abused: No    Family History:    Family History  Problem Relation Age of Onset   Diabetes Mother    Breast cancer Sister      ROS:  Please see the history of present illness.   All other ROS reviewed and negative.     Physical Exam/Data:   Vitals:   06/20/23 1800 06/20/23 1933 06/20/23 2230 06/21/23 0011  BP: (!) 136/94   126/80  Pulse: 73 76 63 68  Resp:    16  Temp:    97.8 F (36.6 C)  TempSrc:    Oral  SpO2: 100% 95% 99% 94%  Weight:      Height:        Intake/Output Summary (Last 24 hours) at 06/21/2023 0130 Last data filed at 06/20/2023 2340 Gross per 24 hour  Intake 370 ml  Output 100 ml  Net 270 ml      06/20/2023    2:40 PM 06/19/2023    7:01 PM 06/19/2023    2:50 PM  Last 3 Weights  Weight (lbs) 230 lb 8 oz 271 lb 2.7 oz 271 lb 2.7 oz  Weight (kg) 104.554 kg 123 kg 123 kg     Body mass index is 38.36 kg/m.  General:  Well nourished, well developed, in no acute distress HEENT: normal Neck: no JVD Vascular: No carotid bruits; Distal pulses 2+ bilaterally Cardiac:  normal S1, S2; RRR; no murmur  Lungs:  clear to auscultation bilaterally, no wheezing, rhonchi or rales  Abd: soft, nontender, no hepatomegaly  Ext: no edema Musculoskeletal:  No deformities, BUE and BLE strength normal and equal Skin: warm and dry  Neuro:  CNs 2-12 intact, no focal abnormalities noted Psych:  Normal affect   EKG:  The EKG was personally reviewed and demonstrates:  06/19/23 (16:51):  NSR; cannot rule out septal infarct; abnormal ECG (unchanged from prior ECG). Telemetry:  Telemetry was personally reviewed and demonstrates:  Sinus rhythm  Relevant CV Studies: # Echocardiogram 06/03/23: IMPRESSIONS   1. Left ventricular ejection  fraction, by estimation, is 60  to 65%. The  left ventricle has normal function. The left ventricle has no regional  wall motion abnormalities. Left ventricular diastolic parameters were  normal.   2. Right ventricular systolic function is normal. The right ventricular  size is normal. Tricuspid regurgitation signal is inadequate for assessing  PA pressure.   3. The mitral valve is normal in structure. No evidence of mitral valve  regurgitation. No evidence of mitral stenosis.   4. The aortic valve is normal in structure. Aortic valve regurgitation is  not visualized. Aortic valve sclerosis is present, with no evidence of  aortic valve stenosis.   5. The inferior vena cava is normal in size with greater than 50%  respiratory variability, suggesting right atrial pressure of 3 mmHg.   Laboratory Data:  High Sensitivity Troponin:   Recent Labs  Lab 06/01/23 0557 06/02/23 1350 06/19/23 1538 06/19/23 2309 06/20/23 1036  TROPONINIHS 3 5 346* 428* 153*     Chemistry Recent Labs  Lab 06/19/23 1039 06/20/23 1036  NA 140 140  K 3.2* 4.8  CL 102 106  CO2 26 27  GLUCOSE 143* 119*  BUN 6 12  CREATININE 0.84 0.69  CALCIUM 9.2 9.4  MG  --  2.2  GFRNONAA >60 >60  ANIONGAP 12 7    Recent Labs  Lab 06/19/23 1039 06/20/23 1953  PROT 7.9 7.2  ALBUMIN 4.0 3.4*  AST 21  --   ALT 18  --   ALKPHOS 84  --   BILITOT 0.4  --    Lipids No results for input(s): "CHOL", "TRIG", "HDL", "LABVLDL", "LDLCALC", "CHOLHDL" in the last 168 hours.  Hematology Recent Labs  Lab 06/19/23 1039 06/20/23 1036  WBC 12.2* 17.5*  RBC 4.86 4.34  HGB 11.4* 10.2*  HCT 37.3 34.0*  MCV 76.7* 78.3*  MCH 23.5* 23.5*  MCHC 30.6 30.0  RDW 17.4* 17.5*  PLT 524* 488*   Thyroid No results for input(s): "TSH", "FREET4" in the last 168 hours.  BNP Recent Labs  Lab 06/19/23 1538  BNP 54.2    DDimer No results for input(s): "DDIMER" in the last 168 hours.   Radiology/Studies:  DG CHEST PORT 1 VIEW  Result Date:  06/20/2023 CLINICAL DATA:  Chest tube EXAM: PORTABLE CHEST 1 VIEW COMPARISON:  Chest x-ray 06/19/2023.  Chest CT 06/19/2023. FINDINGS: New left-sided chest tube is seen in the lower left hemithorax. There is a small left pleural effusion, minimally decreased from prior. No pneumothorax. No new focal lung infiltrate. The cardiomediastinal silhouette is stable. The osseous structures are unchanged. IMPRESSION: New left-sided chest tube. Mild decrease in small left pleural effusion. Electronically Signed   By: Darliss Cheney M.D.   On: 06/20/2023 20:12   CT Angio Chest PE W and/or Wo Contrast  Result Date: 06/19/2023 CLINICAL DATA:  Pulmonary embolism (PE) suspected, high prob Shortness of breath. EXAM: CT ANGIOGRAPHY CHEST WITH CONTRAST TECHNIQUE: Multidetector CT imaging of the chest was performed using the standard protocol during bolus administration of intravenous contrast. Multiplanar CT image reconstructions and MIPs were obtained to evaluate the vascular anatomy. RADIATION DOSE REDUCTION: This exam was performed according to the departmental dose-optimization program which includes automated exposure control, adjustment of the mA and/or kV according to patient size and/or use of iterative reconstruction technique. CONTRAST:  75mL OMNIPAQUE IOHEXOL 350 MG/ML SOLN COMPARISON:  Chest CTA 06/02/2023 FINDINGS: Cardiovascular: There are no filling defects within the pulmonary arteries to suggest pulmonary embolus. Breathing motion artifact limits  assessment of the mid lower lung zones. Aortic atherosclerosis without acute aortic findings. Heart is normal in size. No pericardial effusion. Mediastinum/Nodes: No enlarged mediastinal or hilar lymph nodes. No visible thyroid nodule. Patulous esophagus without wall thickening. Lungs/Pleura: Left pleural effusion has diminished from prior exam, however a moderate volume of pleural fluid persists. This is partially loculated anteriorly. Adjacent compressive atelectasis in  the lower lobe. No right pleural effusion. No acute airspace disease. No endobronchial lesion. Choose of pulmonary edema. Upper Abdomen: No acute findings. Musculoskeletal: Healing left lateral fifth and sixth rib fractures with surrounding callus, increased callus formation from prior. No acute osseous findings. Review of the MIP images confirms the above findings. IMPRESSION: 1. No pulmonary embolus. Breathing motion artifact limits assessment of the mid lower lung zones. 2. Left pleural effusion has diminished from prior exam, however a moderate volume of pleural fluid persists. This is partially loculated anteriorly. Adjacent compressive atelectasis in the lower lobe. 3. Healing left lateral fifth and sixth rib fractures. Aortic Atherosclerosis (ICD10-I70.0). Electronically Signed   By: Narda Rutherford M.D.   On: 06/19/2023 21:16   DG Chest 2 View  Result Date: 06/19/2023 CLINICAL DATA:  Provided history: Shortness of breath. Patient on home oxygen since suffering a rib fracture in September. Dyspnea. EXAM: CHEST - 2 VIEW COMPARISON:  Prior chest radiographs 06/03/2023 and earlier. Chest CT 06/02/2023. FINDINGS: Heart size within normal limits. Small residual left pleural effusion, decreased from the prior examination of 06/03/2023. Persistent mild ill-defined opacity at the lateral left lung base. No appreciable airspace consolidation on the right. No evidence of pneumothorax. Known non-acute fractures of the left fifth and sixth ribs, better appreciated on the prior chest CT of 06/02/2023. Degenerative changes of the spine. IMPRESSION: 1. Small residual left pleural effusion, decreased from the prior examination of 06/03/2023. 2. Persistent mild ill-defined opacity at the lateral left lung base. While this could sign 4 reflect atelectasis, pneumonia cannot be excluded. Electronically Signed   By: Jackey Loge D.O.   On: 06/19/2023 12:53     Assessment and Plan:   Elevated HS-troponin: Patient with  an acute elevation of her HS-troponin level in the absence of chest pain but noted worsening dyspnea likely due to underlying parapneumonic loculated pleural effusion for which she is receiving antibiotics.  Possible etiologies are type II myocardial infarction (demand ischemia) due to acute stress in the setting of sepsis (noted elevated lactic acid) versus stress-induced cardiomyopathy.  Patient did have a significantly elevated diastolic pressure for which hypertensive emergency cannot be excluded (blood pressure normal at this time).  Low-suspicion for type I NSTEMI-ACS at this time.  Echocardiogram was ordered and now pending.  HS-troponin is now down trending.  Another echocardiogram was ordered and now pending.   --Once stable, reasonable to get a pharmacologic stress test to further assess for possible coronary artery disease.   --Based on history and examination and low suspicion for type I NSTEMI-ACS at this time, would not initiate NSTEMI-ACS treatment at this time.    Hypertension: Primary hypertension with noted uncontrolled hypertension earlier now improved.  No end-organ damage.  Potential hypertensive emergency/urgency two days prior. Currently taking carvedilol and amlodipine.  Blood pressure is now controlled. --No change in management.     Risk Assessment/Risk Scores:                For questions or updates, please contact [facility] Please consult [email or web address] for contact info under    Signed, Judie Grieve, MD  06/21/2023 1:30 AM   I have seen, examined the patient, and reviewed the above assessment and plan.    HPI: Courtney Carrillo is a 57 y.o. female with a significant past medical history of hypertension, hypercholesteremia, sleep apnea, recent mechanical fall with left rib fractures subsequently complicated with parapneumonic effusion requiring multiple thoracentesis, recurrent pneumonia, prior cigarette and marijuana use, chronic hypoxic respiratory  failure requiring supplemental oxygen, bronchitis, anxiety, depression, and obesity who presented to the ED with a chief complaint of shortness of breath.  Chest CT showed partially loculated pleural effusion and compressive atelectasis.  Chest tube was placed.  Patient reports that she immediately felt better with improvement in her shortness of breath following chest tube placement.  She was very hypertensive during her presentation in the ER - 181/122.  Troponin was checked and was found to be elevated and cardiology was consulted.  Patient denies any chest pain during this episode.  She also denies any history of chest pain at rest or with exertion.  She is able to perform all her activities without difficulty.  GEN: No acute distress.   Cardiac: Normal rate and regular rhythm.  Resp: Normal work of breathing. Chest tube in place. Ext: No edema.   Psych: Normal affect   HsTrop: 346 -> 428 -> 153  Echo 11/10:   1. Left ventricular ejection fraction, by estimation, is 60 to 65%. The  left ventricle has normal function. The left ventricle has no regional  wall motion abnormalities. There is mild left ventricular hypertrophy.  Left ventricular diastolic function  could not be evaluated.   2. Right ventricular systolic function is normal. The right ventricular  size is normal.   3. The mitral valve is normal in structure. No evidence of mitral valve  regurgitation.   4. The aortic valve was not assessed. Aortic valve regurgitation is not  visualized.   5. The inferior vena cava is normal in size with greater than 50%  respiratory variability, suggesting right atrial pressure of 3 mmHg.   Assessment: MIAKODA ANDAZOLA is a 57 y.o. female with a significant past medical history of hypertension, hypercholesteremia, sleep apnea, recent mechanical fall with left rib fractures subsequently complicated with parapneumonic effusion requiring multiple thoracentesis, recurrent pneumonia, prior cigarette  and marijuana use, chronic hypoxic respiratory failure requiring supplemental oxygen, bronchitis, anxiety, depression, and obesity who presented to the ED with a chief complaint of shortness of breath.  Chest CT showed partially loculated pleural effusion and compressive atelectasis.  Chest tube was placed.  Patient reports that she immediately felt better with improvement in her shortness of breath following chest tube placement.  Troponin appears to have been checked incidentally and was found to be elevated in the setting of severe hypertension.  Troponin has quickly down trended.  Her echocardiogram did not show any wall motion abnormalities to suggest acute ischemia.  I do not feel like this is most consistent with a type I ACS pattern. She does however have risk factors for coronary artery disease.  It would be reasonable to perform restratification with either stress test or coronary CTA prior to discharge once acute issues have resolved.  Plan:  -Stress test or coronary CTA prior to discharge once acute issues have resolved. -Blood pressure control per primary team. -Continue telemetry.  Nobie Putnam, MD 06/21/2023 1:34 PM

## 2023-06-21 NOTE — Plan of Care (Signed)
  Problem: Clinical Measurements: Goal: Ability to maintain clinical measurements within normal limits will improve Outcome: Progressing Goal: Respiratory complications will improve Outcome: Progressing Goal: Cardiovascular complication will be avoided Outcome: Progressing   Problem: Activity: Goal: Risk for activity intolerance will decrease Outcome: Progressing   Problem: Nutrition: Goal: Adequate nutrition will be maintained Outcome: Progressing   Problem: Coping: Goal: Level of anxiety will decrease Outcome: Progressing   Problem: Pain Management: Goal: General experience of comfort will improve Outcome: Progressing   Problem: Safety: Goal: Ability to remain free from injury will improve Outcome: Progressing   Problem: Skin Integrity: Goal: Risk for impaired skin integrity will decrease Outcome: Progressing

## 2023-06-21 NOTE — Progress Notes (Signed)
NAME:  Courtney Carrillo, MRN:  [id number], DOB:  20-May-1966, LOS: 1 ADMISSION DATE:  06/20/2023, CONSULTATION DATE:  06/20/2023 REFERRING MD:  Dr. Katrinka Blazing - [facility], CHIEF COMPLAINT:  Pleural effusion    History of Present Illness:  Courtney Carrillo is a 57 y.o. female with a past medical history significant for HTN, HLD, recurrent pneumonia with parapneumonic effusion, anxiety, depression, obesity, and OSA who presented to the ED again with complaints of shortness of breath.  Of note patient was originally seen 9/23 after suffering mechanical fall resulting in left fifth and sixth rib fracture without pneumothorax stabilized in ED and discharged.  Returned to hospital 9/27 with worsening shortness of breath CT noted for left large pleural effusion, treated for pneumonia and underwent thoracentesis with bloody effusion seen.  Again admitted 10/23 with recurrent shortness of breath found to have recurrent effusion treated with repeat thoracentesis.  Both thoracentesis have been exudative per lights criteria.  Patient was admitted per hospitalist service for management of acute hypoxic respiratory failure with sepsis in the setting of loculated effusion. PCCM consulted    Pertinent  Medical History  HTN, HLD, recurrent pneumonia with parapneumonic effusion, anxiety, depression, obesity, and OSA   Significant Hospital Events: Including procedures, antibiotic start and stop dates in addition to other pertinent events   11/9 transferred to [facility] for further evaluation from pulmonology versus cardiothoracic surgery.  Pigtail chest tube placed  Interim History / Subjective:  Pain from chest tube isn't too bad, breathing much better.   Objective   Blood pressure (!) 133/100, pulse 80, temperature 98.8 F (37.1 C), temperature source Oral, resp. rate 20, height 5\' 5"  (1.651 m), weight 104.6 kg, SpO2 95%.        Intake/Output Summary (Last 24 hours) at 06/21/2023 1502 Last data filed at 06/21/2023  1400 Gross per 24 hour  Intake 1286.82 ml  Output 200 ml  Net 1086.82 ml   Filed Weights   06/20/23 1440  Weight: 104.6 kg    Examination: General:  middle aged woman sitting up in bed in NAD HEENT: [place]//AT Neuro: awake, alert, moving all extremities CV: S1S2, RRR PULM: breathing comfortably on 1.5L >weaned to [place] while I was in the room. Decreased basilar breath sounds. About 250cc in atrium GI: soft, NT Extremities: no cyanosis or edema  Skin: warm, dry, no rashes  CXR personally reviewed> persistent small effusion in left base, chest tube in appropriate position  WBC 16.8 H/H 9.3/30.9 Platelets 459  Pleural fluid: WBC 5705, 96% lymphs, 3% PMNs Glucose 112 LDH 205 Protein 5  Resolved Hospital Problem list     Assessment & Plan:    Acute hypoxic respiratory failure- resolved -wean off O2 today after effusion drained -monitor pulse ox   Loculated pleural effusion, lymphocyte predominant -con't chest tube; -hopefully can d/c chest tube tomorrow -cytology collected when chest tube was placed; worry about malignancy or other chronic cause of inflammation with recurrent effusion, although previously it has been bloody and that has now cleared  -AM CXR  -fluid culture & cyto pending  Persistent leukocytosis-- would consider hematology consult, especially with lymphocyte predominant effusion.  -check PCT, but low suspicion for infection -follow pleural culture  Best Practice (right click and "Reselect all SmartList Selections" daily)  Per primary  Labs   CBC: Recent Labs  Lab 06/19/23 1039 06/20/23 1036 06/21/23 0426  WBC 12.2* 17.5* 16.8*  NEUTROABS 8.7*  --   --   HGB 11.4* 10.2* 9.3*  HCT 37.3  34.0* 30.9*  MCV 76.7* 78.3* 77.4*  PLT 524* 488* 459*    Basic Metabolic Panel: Recent Labs  Lab 06/19/23 1039 06/20/23 1036 06/21/23 0426  NA 140 140 138  K 3.2* 4.8 4.2  CL 102 106 103  CO2 26 27 27   GLUCOSE 143* 119* 100*  BUN 6 12 13    CREATININE 0.84 0.69 0.82  CALCIUM 9.2 9.4 9.1  MG  --  2.2  --    GFR: Estimated Creatinine Clearance: 90.8 mL/min (by C-G formula based on SCr of 0.82 mg/dL). Recent Labs  Lab 06/19/23 1039 06/19/23 1538 06/19/23 2013 06/20/23 1036 06/20/23 1240 06/21/23 0426  PROCALCITON  --  <0.10  --   --   --   --   WBC 12.2*  --   --  17.5*  --  16.8*  LATICACIDVEN  --  2.3* 4.1* 1.0 1.3  --      Critical care time:        Steffanie Dunn, DO 06/21/23 3:11 PM [facility]  For contact information, see Amion. If no response to pager, please call PCCM consult pager. After hours, 7PM- 7AM, please call Elink.

## 2023-06-21 NOTE — Progress Notes (Signed)
PROGRESS NOTE    Courtney Carrillo  UXL:[id number] DOB: 09-14-1965 DOA: 06/20/2023 PCP: Lorre Munroe, NP   Brief Narrative: This 57 y.o. female with medical history significant of hypertension, hyperlipidemia, recent pneumonia with parapneumonic effusion, anxiety, depression, obesity, and OSA who presented with complaints of shortness of breath.  Patient was seen in the ED on 9/23 after fall and was found to have left-sided fifth and sixth rib fractures without pneumothorax.  Patient was discharged with lidocaine azithromycin and Augmentin.  Patient returned to the hospital on 9/27 with worsening shortness of breath.  CT showed large pleural effusion with left lower lobe consolidation and compressive atelectasis. He was treated with azithromycin and Rocephin.  Patient presented again in the ED with worsening shortness of breath.  CTA chest was obtained which ruled out pulmonary embolism but shows left pleural effusion which was partially loculated with compressive atelectasis.  Case was discussed with Dr. Cliffton Asters of cardiothoracic surgery who recommended chest tube insertion.  Patient who was transferred from [facility] to [facility] and underwent chest tube insertion.  Patient remains on IV antibiotics.   Assessment & Plan:   Principal Problem:   Loculated pleural effusion Active Problems:   Acute hypoxic respiratory failure (HCC)   Sepsis (HCC)   Troponin level elevated   Hypertensive urgency   Microcytic anemia   Thrombocytosis   Anxiety and depression   Obesity (BMI 30-39.9)   OSA (obstructive sleep apnea)   Primary hypertension   Acute hypoxic respiratory failure: Sepsis secondary to loculated effusion: Patient presented with worsening shortness of breath for last 4 days prior to arrival. Noted to be tachycardic and tachypneic with leucocytosis , lactic acidosis. Patient required high flow nasal cannula to maintain saturation above 92%.  She has had recurrent pleural effusions and  treatment after initially had fall with rib fractures back in September.   CT A chest ruled out PE but showed loculated effusion.  Procalcitonin 0.10  Patient initiated on empiric antibiotic vancomycin and Zosyn.   Case was discussed with cardiothoracic surgery Dr. Cliffton Asters who recommended chest tube insertion.   Blood cultures from yesterday show no growth.  Lactic acid has since trended back to within normal limits Continue supplemental oxygen to maintain saturation above 92%. Follow blood cultures. Continue empiric antibiotics ( vancomycin and Zosyn ) Continue Albuterol nebs as needed for shortness of breath/wheezing. Patient underwent successful chest tube insertion on the left side.  Elevated troponin: High-sensitivity troponins elevated up to 346 ->428, but have since been trending down.   EKG had noted no significant ischemic changes.   Patient was initiated on IV heparin. Last echo from 10/23 showed EF to be 60 to 65% with normal diastolic parameters at that time.   Suspect possibly due to demand in setting acute respiratory distress. Check  echocardiogram Heparin drip discontinued for possible need of chest tube placement and placed on subcu heparin.   Hypertensive urgency: Continue amlodipine and Coreg. Blood pressure has improved.   Microcytic anemia: Hemoglobin 10.2 g/dL which appears around patient's baseline.   Iron studies from 05/08/2023 have been at the lower end of normal. Continue to monitor   Thrombocytosis: Platelet count 488 similar to priors.  Possibly related to possible iron deficiency or reactive to above. Continue to monitor   Anxiety and depression Continue doxepin, BuSpar, and Vraylar   Obesity BMI 38.36 kg/m. Diet and exercise discussed in detail.   DVT prophylaxis: Heparin Code Status: Full code Family Communication: No family at bed side Disposition  Plan:   Status is: Inpatient Remains inpatient appropriate because: Patient was admitted  for acute hypoxic respiratory failure secondary to loculated effusion. Patient underwent chest tube insertion.  Consultants:  Pulmonology CT Sx.  Procedures: Chest tube insertion.  Antimicrobials:  Anti-infectives (From admission, onward)    Start     Dose/Rate Route Frequency Ordered Stop   06/21/23 0000  vancomycin (VANCOREADY) IVPB 1500 mg/300 mL        1,500 mg 150 mL/hr over 120 Minutes Intravenous Every 24 hours 06/20/23 1747     06/20/23 1830  piperacillin-tazobactam (ZOSYN) IVPB 3.375 g        3.375 g 12.5 mL/hr over 240 Minutes Intravenous Every 8 hours 06/20/23 1750        Subjective: Patient was seen and examined at bedside.  Overnight events noted.   Patient reports doing much better.  She was ambulating in the room.  She denies any chest pain.  Objective: Vitals:   06/20/23 2230 06/21/23 0011 06/21/23 0440 06/21/23 0919  BP:  126/80 (!) 123/94 130/88  Pulse: 63 68 60 68  Resp:  16 18 18   Temp:  97.8 F (36.6 C) 98.3 F (36.8 C) 99.1 F (37.3 C)  TempSrc:  Oral Oral Oral  SpO2: 99% 94% 95% 100%  Weight:      Height:        Intake/Output Summary (Last 24 hours) at 06/21/2023 1344 Last data filed at 06/21/2023 0641 Gross per 24 hour  Intake 748.4 ml  Output 200 ml  Net 548.4 ml   Filed Weights   06/20/23 1440  Weight: 104.6 kg    Examination:  General exam: Appears calm and comfortable, not in any acute distress. Respiratory system: CTA bilaterally. Respiratory effort normal.  RR 16 Cardiovascular system: S1 & S2 heard, RRR. No JVD, murmurs, rubs, gallops or clicks. No pedal edema. Gastrointestinal system: Abdomen is non distended, soft and non tender.  Normal bowel sounds heard. Central nervous system: Alert and oriented x 3. No focal neurological deficits. Extremities: Symmetric 5 x 5 power. Skin: No rashes, lesions or ulcers Psychiatry: Judgement and insight appear normal. Mood & affect appropriate.     Data Reviewed: I have personally  reviewed following labs and imaging studies  CBC: Recent Labs  Lab 06/19/23 1039 06/20/23 1036 06/21/23 0426  WBC 12.2* 17.5* 16.8*  NEUTROABS 8.7*  --   --   HGB 11.4* 10.2* 9.3*  HCT 37.3 34.0* 30.9*  MCV 76.7* 78.3* 77.4*  PLT 524* 488* 459*   Basic Metabolic Panel: Recent Labs  Lab 06/19/23 1039 06/20/23 1036 06/21/23 0426  NA 140 140 138  K 3.2* 4.8 4.2  CL 102 106 103  CO2 26 27 27   GLUCOSE 143* 119* 100*  BUN 6 12 13   CREATININE 0.84 0.69 0.82  CALCIUM 9.2 9.4 9.1  MG  --  2.2  --    GFR: Estimated Creatinine Clearance: 90.8 mL/min (by C-G formula based on SCr of 0.82 mg/dL). Liver Function Tests: Recent Labs  Lab 06/19/23 1039 06/20/23 1953  AST 21  --   ALT 18  --   ALKPHOS 84  --   BILITOT 0.4  --   PROT 7.9 7.2  ALBUMIN 4.0 3.4*   No results for input(s): "LIPASE", "AMYLASE" in the last 168 hours. No results for input(s): "AMMONIA" in the last 168 hours. Coagulation Profile: Recent Labs  Lab 06/19/23 2013  INR 1.0   Cardiac Enzymes: No results for input(s): "CKTOTAL", "CKMB", "CKMBINDEX", "  TROPONINI" in the last 168 hours. BNP (last 3 results) No results for input(s): "PROBNP" in the last 8760 hours. HbA1C: No results for input(s): "HGBA1C" in the last 72 hours. CBG: No results for input(s): "GLUCAP" in the last 168 hours. Lipid Profile: No results for input(s): "CHOL", "HDL", "LDLCALC", "TRIG", "CHOLHDL", "LDLDIRECT" in the last 72 hours. Thyroid Function Tests: No results for input(s): "TSH", "T4TOTAL", "FREET4", "T3FREE", "THYROIDAB" in the last 72 hours. Anemia Panel: No results for input(s): "VITAMINB12", "FOLATE", "FERRITIN", "TIBC", "IRON", "RETICCTPCT" in the last 72 hours. Sepsis Labs: Recent Labs  Lab 06/19/23 1538 06/19/23 2013 06/20/23 1036 06/20/23 1240  PROCALCITON <0.10  --   --   --   LATICACIDVEN 2.3* 4.1* 1.0 1.3    Recent Results (from the past 240 hour(s))  Blood culture (routine x 2)     Status: None  (Preliminary result)   Collection Time: 06/19/23  3:38 PM   Specimen: BLOOD  Result Value Ref Range Status   Specimen Description BLOOD BLOOD LEFT ARM  Final   Special Requests   Final    BOTTLES DRAWN AEROBIC AND ANAEROBIC Blood Culture results may not be optimal due to an excessive volume of blood received in culture bottles   Culture  Setup Time ANAEROBIC BOTTLE ONLY  Final   Culture   Final    NO GROWTH 2 DAYS Performed at [facility], [address], [place], [place] [address]    Report Status PENDING  Incomplete  Blood culture (routine x 2)     Status: None (Preliminary result)   Collection Time: 06/19/23  3:38 PM   Specimen: BLOOD  Result Value Ref Range Status   Specimen Description BLOOD BLOOD LEFT ARM  Final   Special Requests   Final    BOTTLES DRAWN AEROBIC AND ANAEROBIC Blood Culture results may not be optimal due to an excessive volume of blood received in culture bottles   Culture  Setup Time AEROBIC BOTTLE ONLY  Final   Culture   Final    NO GROWTH 2 DAYS Performed at [facility], [address] [place], [place] [address]    Report Status PENDING  Incomplete  Gram stain     Status: None   Collection Time: 06/20/23  6:25 PM   Specimen: Pleura  Result Value Ref Range Status   Specimen Description PLEURAL  Final   Special Requests NONE  Final   Gram Stain   Final    WBC PRESENT, PREDOMINANTLY MONONUCLEAR NO ORGANISMS SEEN CYTOSPIN SMEAR Performed at [facility], [address], [place], [place] [address]    Report Status 06/20/2023 FINAL  Final  Culture, body fluid w Gram Stain-bottle     Status: None (Preliminary result)   Collection Time: 06/20/23  6:25 PM   Specimen: Pleura  Result Value Ref Range Status   Specimen Description PLEURAL  Final   Special Requests NONE  Final   Culture   Final    NO GROWTH < 24 HOURS Performed at [facility], [address] [place], [place] [address]    Report Status PENDING   Incomplete    Radiology Studies: ECHOCARDIOGRAM LIMITED  Result Date: 06/21/2023    ECHOCARDIOGRAM REPORT   Patient Name:   Courtney Carrillo Date of Exam: 06/21/2023 Medical Rec #:  [id number]      Height:       65.0 in Accession #:    [id number]     Weight:  230.5 lb Date of Birth:  June 25, 1966      BSA:          2.101 m Patient Age:    57 years       BP:           123/94 mmHg Patient Gender: F              HR:           64 bpm. Exam Location:  Inpatient Procedure: Limited Echo, Color Doppler and Cardiac Doppler Indications:    Elevated troponin  History:        Patient has prior history of Echocardiogram examinations, most                 recent 06/03/2023. CHF; Risk Factors:Sleep Apnea, Hypertension                 and Dyslipidemia.  Sonographer:    Milbert Coulter Referring Phys: [id number] RONDELL A SMITH IMPRESSIONS  1. Left ventricular ejection fraction, by estimation, is 60 to 65%. The left ventricle has normal function. The left ventricle has no regional wall motion abnormalities. There is mild left ventricular hypertrophy. Left ventricular diastolic function could not be evaluated.  2. Right ventricular systolic function is normal. The right ventricular size is normal.  3. The mitral valve is normal in structure. No evidence of mitral valve regurgitation.  4. The aortic valve was not assessed. Aortic valve regurgitation is not visualized.  5. The inferior vena cava is normal in size with greater than 50% respiratory variability, suggesting right atrial pressure of 3 mmHg. Comparison(s): No significant change from prior study. 06/03/2023: LVEF 60-65%. FINDINGS  Left Ventricle: Left ventricular ejection fraction, by estimation, is 60 to 65%. The left ventricle has normal function. The left ventricle has no regional wall motion abnormalities. The left ventricular internal cavity size was normal in size. There is  mild left ventricular hypertrophy. Left ventricular diastolic function could not be evaluated.  Right Ventricle: The right ventricular size is normal. No increase in right ventricular wall thickness. Right ventricular systolic function is normal. Left Atrium: Left atrial size was not assessed. Right Atrium: Right atrial size was not assessed. Pericardium: There is no evidence of pericardial effusion. Mitral Valve: The mitral valve is normal in structure. No evidence of mitral valve regurgitation. Tricuspid Valve: The tricuspid valve is grossly normal. Tricuspid valve regurgitation is not demonstrated. Aortic Valve: The aortic valve was not assessed. Aortic valve regurgitation is not visualized. Pulmonic Valve: The pulmonic valve was not assessed. Pulmonic valve regurgitation is not visualized. Aorta: Aortic root could not be assessed. Venous: The inferior vena cava is normal in size with greater than 50% respiratory variability, suggesting right atrial pressure of 3 mmHg. IAS/Shunts: The interatrial septum was not assessed.  LEFT VENTRICLE PLAX 2D LVIDd:         4.20 cm LVIDs:         2.70 cm LV PW:         1.40 cm LV IVS:        1.20 cm LVOT diam:     1.90 cm LVOT Area:     2.84 cm  RIGHT VENTRICLE RV S prime:     13.20 cm/s TAPSE (M-mode): 1.9 cm LEFT ATRIUM         Index LA diam:    2.80 cm 1.33 cm/m   AORTA Ao Root diam: 2.60 cm  SHUNTS Systemic Diam: 1.90 cm Zoila Shutter MD Electronically signed  by Zoila Shutter MD Signature Date/Time: 06/21/2023/1:00:23 PM    Final    DG CHEST PORT 1 VIEW  Result Date: 06/21/2023 CLINICAL DATA:  Chest pain. EXAM: PORTABLE CHEST 1 VIEW COMPARISON:  June 20, 2023 FINDINGS: The heart size and mediastinal contours are within normal limits. A left-sided chest tube is again seen with its distal tip overlying the medial aspect of the left lung base. Mild atelectasis is seen within the bilateral lung bases. A small, stable left pleural effusion is noted. No pneumothorax is identified. A nondisplaced lateral sixth left rib fracture is seen. The visualized skeletal  structures are unremarkable. IMPRESSION: 1. Left-sided chest tube positioning, as described above. 2. Mild bibasilar atelectasis. 3. Small left pleural effusion. Electronically Signed   By: Aram Candela M.D.   On: 06/21/2023 03:30   DG CHEST PORT 1 VIEW  Result Date: 06/20/2023 CLINICAL DATA:  Chest tube EXAM: PORTABLE CHEST 1 VIEW COMPARISON:  Chest x-ray 06/19/2023.  Chest CT 06/19/2023. FINDINGS: New left-sided chest tube is seen in the lower left hemithorax. There is a small left pleural effusion, minimally decreased from prior. No pneumothorax. No new focal lung infiltrate. The cardiomediastinal silhouette is stable. The osseous structures are unchanged. IMPRESSION: New left-sided chest tube. Mild decrease in small left pleural effusion. Electronically Signed   By: Darliss Cheney M.D.   On: 06/20/2023 20:12   CT Angio Chest PE W and/or Wo Contrast  Result Date: 06/19/2023 CLINICAL DATA:  Pulmonary embolism (PE) suspected, high prob Shortness of breath. EXAM: CT ANGIOGRAPHY CHEST WITH CONTRAST TECHNIQUE: Multidetector CT imaging of the chest was performed using the standard protocol during bolus administration of intravenous contrast. Multiplanar CT image reconstructions and MIPs were obtained to evaluate the vascular anatomy. RADIATION DOSE REDUCTION: This exam was performed according to the departmental dose-optimization program which includes automated exposure control, adjustment of the mA and/or kV according to patient size and/or use of iterative reconstruction technique. CONTRAST:  75mL OMNIPAQUE IOHEXOL 350 MG/ML SOLN COMPARISON:  Chest CTA 06/02/2023 FINDINGS: Cardiovascular: There are no filling defects within the pulmonary arteries to suggest pulmonary embolus. Breathing motion artifact limits assessment of the mid lower lung zones. Aortic atherosclerosis without acute aortic findings. Heart is normal in size. No pericardial effusion. Mediastinum/Nodes: No enlarged mediastinal or hilar  lymph nodes. No visible thyroid nodule. Patulous esophagus without wall thickening. Lungs/Pleura: Left pleural effusion has diminished from prior exam, however a moderate volume of pleural fluid persists. This is partially loculated anteriorly. Adjacent compressive atelectasis in the lower lobe. No right pleural effusion. No acute airspace disease. No endobronchial lesion. Choose of pulmonary edema. Upper Abdomen: No acute findings. Musculoskeletal: Healing left lateral fifth and sixth rib fractures with surrounding callus, increased callus formation from prior. No acute osseous findings. Review of the MIP images confirms the above findings. IMPRESSION: 1. No pulmonary embolus. Breathing motion artifact limits assessment of the mid lower lung zones. 2. Left pleural effusion has diminished from prior exam, however a moderate volume of pleural fluid persists. This is partially loculated anteriorly. Adjacent compressive atelectasis in the lower lobe. 3. Healing left lateral fifth and sixth rib fractures. Aortic Atherosclerosis (ICD10-I70.0). Electronically Signed   By: Narda Rutherford M.D.   On: 06/19/2023 21:16    Scheduled Meds:  amLODipine  10 mg Oral Daily   benztropine  1 mg Oral q morning   busPIRone  20 mg Oral BID   cariprazine  6 mg Oral Daily   carvedilol  3.125 mg Oral BID WC  doxepin  50 mg Oral QHS   heparin injection (subcutaneous)  5,000 Units Subcutaneous Q8H   pneumococcal 20-valent conjugate vaccine  0.5 mL Intramuscular Tomorrow-1000   sodium chloride flush  10 mL Intrapleural Q8H   sodium chloride flush  3 mL Intravenous Q12H   Continuous Infusions:  piperacillin-tazobactam 3.375 g (06/21/23 0959)   vancomycin 1,500 mg (06/20/23 2349)     LOS: 1 day    Time spent: 50 mins    Willeen Niece, MD [facility]   If 7PM-7AM, please contact night-coverage

## 2023-06-22 ENCOUNTER — Inpatient Hospital Stay (HOSPITAL_COMMUNITY): Payer: 59

## 2023-06-22 DIAGNOSIS — R7989 Other specified abnormal findings of blood chemistry: Secondary | ICD-10-CM | POA: Diagnosis not present

## 2023-06-22 DIAGNOSIS — I160 Hypertensive urgency: Secondary | ICD-10-CM | POA: Diagnosis not present

## 2023-06-22 DIAGNOSIS — J9601 Acute respiratory failure with hypoxia: Secondary | ICD-10-CM | POA: Diagnosis not present

## 2023-06-22 DIAGNOSIS — E782 Mixed hyperlipidemia: Secondary | ICD-10-CM

## 2023-06-22 DIAGNOSIS — J90 Pleural effusion, not elsewhere classified: Secondary | ICD-10-CM | POA: Diagnosis not present

## 2023-06-22 LAB — BLOOD GAS, VENOUS
Acid-Base Excess: 3.6 mmol/L — ABNORMAL HIGH (ref 0.0–2.0)
O2 Saturation: 44.2 %
Patient temperature: 37
Patient temperature: 37
pCO2, Ven: 59 mm[Hg] (ref 44–60)
pH, Ven: 7.33 (ref 7.25–7.43)
pO2, Ven: 31.1 mmol/L — ABNORMAL HIGH (ref 32–45)

## 2023-06-22 LAB — BASIC METABOLIC PANEL
Anion gap: 9 (ref 5–15)
BUN: 9 mg/dL (ref 6–20)
CO2: 28 mmol/L (ref 22–32)
Calcium: 9.2 mg/dL (ref 8.9–10.3)
Chloride: 103 mmol/L (ref 98–111)
Creatinine, Ser: 0.86 mg/dL (ref 0.44–1.00)
GFR, Estimated: >60 mL/min (ref >60)
Glucose, Bld: 84 mg/dL (ref 70–99)
Potassium: 3.9 mmol/L (ref 3.5–5.1)
Sodium: 140 mmol/L (ref 135–145)

## 2023-06-22 LAB — LIPID PANEL
Cholesterol: 169 mg/dL (ref 0–200)
HDL: 54 mg/dL (ref >40)
LDL Cholesterol: 86 mg/dL (ref 0–99)
Total CHOL/HDL Ratio: 3.1 {ratio}
Triglycerides: 147 mg/dL (ref <150)
VLDL: 29 mg/dL (ref 0–40)

## 2023-06-22 LAB — CBC
HCT: 32.9 % — ABNORMAL LOW (ref 36.0–46.0)
Hemoglobin: 9.9 g/dL — ABNORMAL LOW (ref 12.0–15.0)
MCH: 23.3 pg — ABNORMAL LOW (ref 26.0–34.0)
MCHC: 30.1 g/dL (ref 30.0–36.0)
MCV: 77.6 fL — ABNORMAL LOW (ref 80.0–100.0)
Platelets: 437 10*3/uL — ABNORMAL HIGH (ref 150–400)
RBC: 4.24 MIL/uL (ref 3.87–5.11)
RDW: 17.8 % — ABNORMAL HIGH (ref 11.5–15.5)
WBC: 11.7 10*3/uL — ABNORMAL HIGH (ref 4.0–10.5)
nRBC: 0 % (ref 0.0–0.2)

## 2023-06-22 LAB — PHOSPHORUS: Phosphorus: 3.6 mg/dL (ref 2.5–4.6)

## 2023-06-22 LAB — CYTOLOGY - NON PAP

## 2023-06-22 LAB — MAGNESIUM: Magnesium: 2 mg/dL (ref 1.7–2.4)

## 2023-06-22 MED ORDER — SENNOSIDES-DOCUSATE SODIUM 8.6-50 MG PO TABS
1.0000 | ORAL_TABLET | Freq: Two times a day (BID) | ORAL | Status: DC
  Administered 2023-06-22 – 2023-06-23 (×3): 1 via ORAL
  Filled 2023-06-22 (×3): qty 1

## 2023-06-22 NOTE — Progress Notes (Addendum)
Patient Name: Courtney Carrillo Date of Encounter: 06/22/2023 [facility] Cardiologist: None   Interval Summary  .    She is feeling well this morning, hopeful for DC home soon. No chest pain. Reports she never had any anginal symptoms prior to admission, only shortness of breath from  pleural effusion.   Vital Signs .    Vitals:   06/21/23 2043 06/21/23 2153 06/22/23 0348 06/22/23 0732  BP: 115/80  (!) 149/96 (!) 157/97  Pulse:  68 80 83  Resp: 18  18 18   Temp: 99.2 F (37.3 C)  99.2 F (37.3 C) 99.5 F (37.5 C)  TempSrc: Oral  Oral Oral  SpO2:  95% 96% 96%  Weight:      Height:        Intake/Output Summary (Last 24 hours) at 06/22/2023 0758 Last data filed at 06/22/2023 0750 Gross per 24 hour  Intake 1417.66 ml  Output 83 ml  Net 1334.66 ml      06/20/2023    2:40 PM 06/19/2023    7:01 PM 06/19/2023    2:50 PM  Last 3 Weights  Weight (lbs) 230 lb 8 oz 271 lb 2.7 oz 271 lb 2.7 oz  Weight (kg) 104.554 kg 123 kg 123 kg      Telemetry/ECG    Sinus Rhythm - Personally Reviewed  Physical Exam .   GEN: No acute distress.   Neck: No JVD Cardiac: RRR, no murmurs, rubs, or gallops.  Respiratory: Clear to auscultation bilaterally. Left chest tube in place GI: Soft, nontender, non-distended  MS: No edema  Assessment & Plan .     57 y.o. female with a significant past medical history of hypertension, hypercholesteremia, sleep apnea, recent mechanical fall with left rib fractures subsequently complicated with parapneumonic effusion requiring multiple thoracentesis, recurrent pneumonia, prior cigarette and marijuana use, chronic hypoxic respiratory failure requiring supplemental oxygen, bronchitis, anxiety, depression, and obesity who was seen 06/21/2023 for the evaluation of elevated HS-troponin at the request of Dr. Katrinka Blazing.   Elevated Troponin -- hsTn 346>>428>> 153, EKG without ischemic changes.  Echocardiogram 11/10 with LVEF of 60 to 65%, no regional wall  motion abnormality, mild LVH, normal RV. -- suspect elevation could be secondary to hand ischemia in the setting of pulmonary issues, though does have risk factors for coronary disease. -- no chest pain prior to admission, with her risk factors it is reasonable to consider further testing with coronary CTA but suspect this can be completed as an outpatient once she has recovered  Acute hypoxic respiratory failure Loculated left-sided pleural effusion Recurrent pleural effusions, fall with rib fractures 04/2023 -- Seen by PCCM s/p chest tube -- Lactic acid and procalcitonin initially elevated, now improved -- BC NTD -- Has been on empiric antibiotics with vancomycin and Zosyn  Hypertension -- Continue amlodipine 10 mg daily, carvedilol 3.125 mg twice daily  Per primary Anemia Thrombocytosis Anxiety Depression  For questions or updates, please contact  [facility] Please consult [email or web address] for contact info under        Signed, Laverda Page, NP    ATTENDING ATTESTATION  I have seen, examined and evaluated the patient this morning on rounds along with Courtney Number, NP.  After reviewing all the available data and chart, we discussed the patients laboratory, study & physical findings as well as symptoms in detail.  I agree with her findings, examination as well as impression recommendations as per our discussion.    Admitted with loculated left-sided pleural effusion  and acute hypoxic respite failure now status post chest tube placement.  Plan is to potentially remove drain today.  She had elevated troponin but no ischemic changes and normal EF on echo.  Likely demand ischemia related.  However at this point I think we can simply allow her to recover from this ongoing condition.  Continue amlodipine and carvedilol and likely consider statin on discharge.  I agree that outpatient Coronary CTA can be considered once she is stabilized in the outpatient setting.  [facility] will sign off.   Medication Recommendations: Continue current dose of amlodipine and carvedilol, restart home dose atorvastatin on discharge  other recommendations (labs, testing, etc): Consider outpatient Coronary CTA Follow up as an outpatient: Will arrange outpatient follow-up prior to discharge.     Marykay Lex, MD, MS Bryan Lemma, M.D., M.S. Interventional Cardiologist  [facility]  Pager # [phone] Phone # [phone] [address] [place], [place] [address]

## 2023-06-22 NOTE — Plan of Care (Signed)
  Problem: Health Behavior/Discharge Planning: Goal: Ability to manage health-related needs will improve Outcome: Progressing   Problem: Clinical Measurements: Goal: Will remain free from infection Outcome: Progressing Goal: Respiratory complications will improve Outcome: Progressing Goal: Cardiovascular complication will be avoided Outcome: Progressing   Problem: Activity: Goal: Risk for activity intolerance will decrease Outcome: Progressing   Problem: Nutrition: Goal: Adequate nutrition will be maintained Outcome: Progressing   Problem: Coping: Goal: Level of anxiety will decrease Outcome: Progressing   Problem: Pain Management: Goal: General experience of comfort will improve Outcome: Progressing   Problem: Safety: Goal: Ability to remain free from injury will improve Outcome: Progressing   Problem: Skin Integrity: Goal: Risk for impaired skin integrity will decrease Outcome: Progressing

## 2023-06-22 NOTE — Progress Notes (Signed)
Chest tube removed per order VSS patient educated on need for 1 hour of bedrest. Bed in lowest position with call light in reach safety ensured. Current plan of care in progress

## 2023-06-22 NOTE — Plan of Care (Signed)

## 2023-06-22 NOTE — Progress Notes (Signed)
NAME:  HOLLYND WELLENS, MRN:  [id number], DOB:  06/10/1966, LOS: 2 ADMISSION DATE:  06/20/2023, CONSULTATION DATE:  06/20/2023 REFERRING MD:  Dr. Katrinka Blazing - [facility], CHIEF COMPLAINT:  Pleural effusion    History of Present Illness:  Courtney Carrillo is a 57 y.o. female with a past medical history significant for HTN, HLD, recurrent pneumonia with parapneumonic effusion, anxiety, depression, obesity, and OSA who presented to the ED again with complaints of shortness of breath.  Of note patient was originally seen 9/23 after suffering mechanical fall resulting in left fifth and sixth rib fracture without pneumothorax stabilized in ED and discharged.  Returned to hospital 9/27 with worsening shortness of breath CT noted for left large pleural effusion, treated for pneumonia and underwent thoracentesis with bloody effusion seen.  Again admitted 10/23 with recurrent shortness of breath found to have recurrent effusion treated with repeat thoracentesis.  Both thoracentesis have been exudative per lights criteria.  Patient was admitted per hospitalist service for management of acute hypoxic respiratory failure with sepsis in the setting of loculated effusion. PCCM consulted    Pertinent  Medical History  HTN, HLD, recurrent pneumonia with parapneumonic effusion, anxiety, depression, obesity, and OSA   Significant Hospital Events: Including procedures, antibiotic start and stop dates in addition to other pertinent events   11/9 transferred to [facility] for further evaluation from pulmonology versus cardiothoracic surgery.  Pigtail chest tube placed  Interim History / Subjective:  Denies any pain or discomfort  Objective   Blood pressure 117/74, pulse 62, temperature 98.6 F (37 C), temperature source Oral, resp. rate 18, height 5\' 5"  (1.651 m), weight 104.6 kg, SpO2 94%.        Intake/Output Summary (Last 24 hours) at 06/22/2023 1152 Last data filed at 06/22/2023 1139 Gross per 24 hour  Intake 1177.66 ml   Output 783 ml  Net 394.66 ml   Filed Weights   06/20/23 1440  Weight: 104.6 kg    Examination: General: Middle-age, does not appear to be in distress HEENT: Moist oral mucosa Neuro: Awake alert interactive CV: S1S2, RRR PULM: No significant increased output from chest tube  I reviewed nursing notes, last 24 h vitals and pain scores, last 48 h intake and output, last 24 h labs and trends, and last 24 h imaging results.  Resolved Hospital Problem list     Assessment & Plan:   Acute hypoxemic respiratory failure  Loculated pleural effusion -S/p chest tube placement -Cytology pending -Exudative fluid -Chest x-ray is stable -No significant further effluent  Chest tube can be discontinued  Persistent leukocytosis-- would consider hematology consult, especially with lymphocyte predominant effusion.  -check PCT, but low suspicion for infection -follow pleural culture  Will follow cytology peripherally  Best Practice (right click and "Reselect all SmartList Selections" daily)  Per primary  Labs   CBC: Recent Labs  Lab 06/19/23 1039 06/20/23 1036 06/21/23 0426 06/22/23 0553  WBC 12.2* 17.5* 16.8* 11.7*  NEUTROABS 8.7*  --   --   --   HGB 11.4* 10.2* 9.3* 9.9*  HCT 37.3 34.0* 30.9* 32.9*  MCV 76.7* 78.3* 77.4* 77.6*  PLT 524* 488* 459* 437*    Basic Metabolic Panel: Recent Labs  Lab 06/19/23 1039 06/20/23 1036 06/21/23 0426 06/22/23 0553  NA 140 140 138 140  K 3.2* 4.8 4.2 3.9  CL 102 106 103 103  CO2 26 27 27 28   GLUCOSE 143* 119* 100* 84  BUN 6 12 13 9   CREATININE 0.84 0.69  0.82 0.86  CALCIUM 9.2 9.4 9.1 9.2  MG  --  2.2  --  2.0  PHOS  --   --   --  3.6   GFR: Estimated Creatinine Clearance: 86.6 mL/min (by C-G formula based on SCr of 0.86 mg/dL). Recent Labs  Lab 06/19/23 1039 06/19/23 1538 06/19/23 2013 06/20/23 1036 06/20/23 1240 06/21/23 0426 06/21/23 1815 06/22/23 0553  PROCALCITON  --  <0.10  --   --   --   --  <0.10  --   WBC  12.2*  --   --  17.5*  --  16.8*  --  11.7*  LATICACIDVEN  --  2.3* 4.1* 1.0 1.3  --   --   --      Virl Diamond, MD [place] [facility] Pager: See Loretha Stapler

## 2023-06-22 NOTE — Progress Notes (Signed)
Transition of Care Department [facility]) has reviewed patient and no TOC needs have been identified at this time. We will continue to monitor patient advancement through interdisciplinary progression rounds. If new patient transition needs arise, please place a TOC consult.    06/22/23 1238  TOC Brief Assessment  Insurance and Status Reviewed  Patient has primary care physician Yes  Home environment has been reviewed home  Prior level of function: independent  Prior/Current Home Services No current home services (pt has home 02-3L)  Social Determinants of Health Reivew SDOH reviewed no interventions necessary  Readmission risk has been reviewed Yes  Transition of care needs no transition of care needs at this time

## 2023-06-22 NOTE — Progress Notes (Signed)
PROGRESS NOTE    Courtney Carrillo  YQM:[id number] DOB: 1966/02/14 DOA: 06/20/2023 PCP: Lorre Munroe, NP   Brief Narrative: This 57 y.o. female with medical history significant of hypertension, hyperlipidemia, recent pneumonia with parapneumonic effusion, anxiety, depression, obesity, and OSA who presented with complaints of shortness of breath.  Patient was seen in the ED on 9/23 after fall and was found to have left-sided fifth and sixth rib fractures without pneumothorax.  Patient was discharged with lidocaine azithromycin and Augmentin.  Patient returned to the hospital on 9/27 with worsening shortness of breath.  CT showed large pleural effusion with left lower lobe consolidation and compressive atelectasis. He was treated with azithromycin and Rocephin.  Patient presented again in the ED with worsening shortness of breath.  CTA chest was obtained which ruled out pulmonary embolism but shows left pleural effusion which was partially loculated with compressive atelectasis.  Case was discussed with Dr. Cliffton Asters of cardiothoracic surgery who recommended chest tube insertion.  Patient who was transferred from [facility] to [facility] and underwent chest tube insertion.  Patient remains on IV antibiotics.   Assessment & Plan:   Principal Problem:   Loculated pleural effusion Active Problems:   Acute hypoxic respiratory failure (HCC)   Sepsis (HCC)   Troponin level elevated   Hypertensive urgency   Microcytic anemia   Thrombocytosis   Anxiety and depression   Obesity (BMI 30-39.9)   OSA (obstructive sleep apnea)   Primary hypertension   Need for management of chest tube   Recurrent pleural effusion   Pleural effusion exudative   History of tobacco abuse   Acute respiratory failure with hypoxia (HCC)   Recurrent pleural effusion on left  Acute hypoxic respiratory failure: Sepsis secondary to loculated effusion: Patient presented with worsening shortness of breath for last 4 days prior to  arrival. Noted to be tachycardic and tachypneic with leucocytosis , lactic acidosis. Patient required high flow nasal cannula to maintain saturation above 92%.  She has had recurrent pleural effusions and treatment after initially had fall with rib fractures back in September.   CT A chest ruled out PE but showed loculated effusion.  Procalcitonin 0.10  Patient initiated on empiric antibiotic vancomycin and Zosyn.   Case was discussed with cardiothoracic surgery Dr. Cliffton Asters who recommended chest tube insertion.   Blood cultures from yesterday show no growth.  Lactic acid has since trended back to within normal limits Continue supplemental oxygen to maintain saturation above 92%. Follow blood cultures. Continue empiric antibiotics ( vancomycin and Zosyn ) Continue Albuterol nebs as needed for shortness of breath/wheezing. Patient underwent successful chest tube insertion on the left side. PCCM is planning to remove chest tube today later in the afternoon.  Elevated troponin: High-sensitivity troponins elevated up to 346 ->428, but have since been trending down.   EKG > no significant ischemic changes.   Patient was initiated on IV heparin. Last echo from 10/23 showed EF to be 60 to 65% with normal diastolic parameters at that time.   Suspect possibly due to demand in setting acute respiratory distress. Heparin drip discontinued for possible need of chest tube placement and placed on subcu heparin.   Hypertensive urgency: Continue amlodipine and Coreg. Blood pressure has improved.   Microcytic anemia: Hemoglobin 10.2 g/dL which appears around patient's baseline.   Iron studies from 05/08/2023 have been at the lower end of normal. Continue to monitor   Thrombocytosis: Platelet count 488 similar to priors.  Possibly related to possible iron deficiency  or reactive to above. Continue to monitor   Anxiety and depression Continue doxepin, BuSpar, and Vraylar   Obesity BMI 38.36  kg/m. Diet and exercise discussed in detail.   DVT prophylaxis: Heparin Code Status: Full code Family Communication: No family at bed side Disposition Plan:   Status is: Inpatient Remains inpatient appropriate because: Patient was admitted for acute hypoxic respiratory failure secondary to loculated effusion.Patient underwent chest tube insertion.  Anticipate discharge home tomorrow once chest tube removed and patient's status stable.  Consultants:  Pulmonology CT Sx.  Procedures: Chest tube insertion.  Antimicrobials:  Anti-infectives (From admission, onward)    Start     Dose/Rate Route Frequency Ordered Stop   06/21/23 0000  vancomycin (VANCOREADY) IVPB 1500 mg/300 mL        1,500 mg 150 mL/hr over 120 Minutes Intravenous Every 24 hours 06/20/23 1747     06/20/23 1830  piperacillin-tazobactam (ZOSYN) IVPB 3.375 g        3.375 g 12.5 mL/hr over 240 Minutes Intravenous Every 8 hours 06/20/23 1750        Subjective: Patient was seen and examined at bedside.  Overnight events noted.   Patient reports doing much better.  She was ambulating in the room.  She denies any chest pain.  Objective: Vitals:   06/22/23 0732 06/22/23 1138 06/22/23 1219 06/22/23 1228  BP: (!) 157/97 (!) 177/74 (!) 146/93 (!) 150/101  Pulse: 83 62  72  Resp: 18 18    Temp: 99.5 F (37.5 C) 98.6 F (37 C)    TempSrc: Oral Oral    SpO2: 96% 94%  95%  Weight:      Height:        Intake/Output Summary (Last 24 hours) at 06/22/2023 1458 Last data filed at 06/22/2023 1139 Gross per 24 hour  Intake 879.24 ml  Output 780 ml  Net 99.24 ml   Filed Weights   06/20/23 1440  Weight: 104.6 kg    Examination:  General exam: Appears calm and comfortable, not in any acute distress. Respiratory system: CTA bilaterally. Respiratory effort normal.  RR 16, chest tube noted. Cardiovascular system: S1 & S2 heard, RRR. No JVD, murmurs, rubs, gallops or clicks.  Gastrointestinal system: Abdomen is non  distended, soft and non tender.  Normal bowel sounds heard. Central nervous system: Alert and oriented x 3. No focal neurological deficits. Extremities: Symmetric 5 x 5 power. Skin: No rashes, lesions or ulcers Psychiatry: Judgement and insight appear normal. Mood & affect appropriate.     Data Reviewed: I have personally reviewed following labs and imaging studies  CBC: Recent Labs  Lab 06/19/23 1039 06/20/23 1036 06/21/23 0426 06/22/23 0553  WBC 12.2* 17.5* 16.8* 11.7*  NEUTROABS 8.7*  --   --   --   HGB 11.4* 10.2* 9.3* 9.9*  HCT 37.3 34.0* 30.9* 32.9*  MCV 76.7* 78.3* 77.4* 77.6*  PLT 524* 488* 459* 437*   Basic Metabolic Panel: Recent Labs  Lab 06/19/23 1039 06/20/23 1036 06/21/23 0426 06/22/23 0553  NA 140 140 138 140  K 3.2* 4.8 4.2 3.9  CL 102 106 103 103  CO2 26 27 27 28   GLUCOSE 143* 119* 100* 84  BUN 6 12 13 9   CREATININE 0.84 0.69 0.82 0.86  CALCIUM 9.2 9.4 9.1 9.2  MG  --  2.2  --  2.0  PHOS  --   --   --  3.6   GFR: Estimated Creatinine Clearance: 86.6 mL/min (by C-G formula based on  SCr of 0.86 mg/dL). Liver Function Tests: Recent Labs  Lab 06/19/23 1039 06/20/23 1953  AST 21  --   ALT 18  --   ALKPHOS 84  --   BILITOT 0.4  --   PROT 7.9 7.2  ALBUMIN 4.0 3.4*   No results for input(s): "LIPASE", "AMYLASE" in the last 168 hours. No results for input(s): "AMMONIA" in the last 168 hours. Coagulation Profile: Recent Labs  Lab 06/19/23 2013  INR 1.0   Cardiac Enzymes: No results for input(s): "CKTOTAL", "CKMB", "CKMBINDEX", "TROPONINI" in the last 168 hours. BNP (last 3 results) No results for input(s): "PROBNP" in the last 8760 hours. HbA1C: No results for input(s): "HGBA1C" in the last 72 hours. CBG: No results for input(s): "GLUCAP" in the last 168 hours. Lipid Profile: Recent Labs    06/22/23 0846  CHOL 169  HDL 54  LDLCALC 86  TRIG 147  CHOLHDL 3.1   Thyroid Function Tests: No results for input(s): "TSH", "T4TOTAL",  "FREET4", "T3FREE", "THYROIDAB" in the last 72 hours. Anemia Panel: No results for input(s): "VITAMINB12", "FOLATE", "FERRITIN", "TIBC", "IRON", "RETICCTPCT" in the last 72 hours. Sepsis Labs: Recent Labs  Lab 06/19/23 1538 06/19/23 2013 06/20/23 1036 06/20/23 1240 06/21/23 1815  PROCALCITON <0.10  --   --   --  <0.10  LATICACIDVEN 2.3* 4.1* 1.0 1.3  --     Recent Results (from the past 240 hour(s))  Blood culture (routine x 2)     Status: None (Preliminary result)   Collection Time: 06/19/23  3:38 PM   Specimen: BLOOD  Result Value Ref Range Status   Specimen Description   Final    BLOOD BLOOD LEFT ARM Performed at [facility], [address], [place], [place] [address]    Special Requests   Final    BOTTLES DRAWN AEROBIC AND ANAEROBIC Blood Culture results may not be optimal due to an excessive volume of blood received in culture bottles Performed at [facility], [address], [place], [place] [address]    Culture  Setup Time   Final    ANAEROBIC BOTTLE ONLY Performed at [facility], [address], [place], [place] [address]    Culture   Final    NO GROWTH 3 DAYS Performed at [facility], [address], [place], [place] [address]    Report Status PENDING  Incomplete  Blood culture (routine x 2)     Status: None (Preliminary result)   Collection Time: 06/19/23  3:38 PM   Specimen: BLOOD  Result Value Ref Range Status   Specimen Description   Final    BLOOD BLOOD LEFT ARM Performed at [facility], [address], [place], [place] [address]    Special Requests   Final    BOTTLES DRAWN AEROBIC AND ANAEROBIC Blood Culture results may not be optimal due to an excessive volume of blood received in culture bottles Performed at [facility], [address], [place], [place] [address]    Culture  Setup Time   Final    AEROBIC BOTTLE ONLY Performed at [facility], [address] [place], [place]  [address]    Culture   Final    NO GROWTH 3 DAYS Performed at [facility], [address], [place], [place] [address]    Report Status PENDING  Incomplete  Gram stain     Status: None   Collection Time: 06/20/23  6:25 PM   Specimen: Pleura  Result Value Ref Range Status   Specimen Description PLEURAL  Final   Special Requests NONE  Final   Gram Stain   Final    WBC PRESENT, PREDOMINANTLY MONONUCLEAR NO ORGANISMS SEEN CYTOSPIN SMEAR Performed at [facility], [address], [place], [place] [address]    Report Status 06/20/2023 FINAL  Final  Culture, body fluid w Gram Stain-bottle     Status: None (Preliminary result)   Collection Time: 06/20/23  6:25 PM   Specimen: Pleura  Result Value Ref Range Status   Specimen Description PLEURAL  Final   Special Requests NONE  Final   Culture   Final    NO GROWTH 2 DAYS Performed at [facility], [address], [place], [place] [address]    Report Status PENDING  Incomplete    Radiology Studies: DG CHEST PORT 1 VIEW  Result Date: 06/22/2023 CLINICAL DATA:  Left pleural effusion. EXAM: PORTABLE CHEST 1 VIEW COMPARISON:  Chest x-ray from yesterday. FINDINGS: Unchanged chest tube at the posterior left lung base. The heart size and mediastinal contours are within normal limits. Normal pulmonary vascularity. Similar indistinctness of the left hemidiaphragm and blunting of the left costophrenic angle presumably reflecting residual small pleural effusion and adjacent atelectasis. Clear right lung. No pneumothorax. No acute osseous abnormality. IMPRESSION: 1. Unchanged small left pleural effusion and adjacent atelectasis with left chest tube in place. Electronically Signed   By: Obie Dredge M.D.   On: 06/22/2023 12:47   ECHOCARDIOGRAM LIMITED  Result Date: 06/21/2023    ECHOCARDIOGRAM REPORT   Patient Name:   Courtney Carrillo Date of Exam: 06/21/2023 Medical Rec #:  [id number]      Height:       65.0 in Accession #:     [id number]     Weight:       230.5 lb Date of Birth:  [birth year]      BSA:          2.101 m Patient Age:    57 years       BP:           123/94 mmHg Patient Gender: F              HR:           64 bpm. Exam Location:  Inpatient Procedure: Limited Echo, Color Doppler and Cardiac Doppler Indications:    Elevated troponin  History:        Patient has prior history of Echocardiogram examinations, most                 recent 06/03/2023. CHF; Risk Factors:Sleep Apnea, Hypertension                 and Dyslipidemia.  Sonographer:    Milbert Coulter Referring Phys: [id number] RONDELL A SMITH IMPRESSIONS  1. Left ventricular ejection fraction, by estimation, is 60 to 65%. The left ventricle has normal function. The left ventricle has no regional wall motion abnormalities. There is mild left ventricular hypertrophy. Left ventricular diastolic function could not be evaluated.  2. Right ventricular systolic function is normal. The right ventricular size is normal.  3. The mitral valve is normal in structure. No evidence of mitral valve regurgitation.  4. The aortic valve was not assessed. Aortic valve regurgitation is not visualized.  5. The inferior vena cava is normal in size with greater than 50% respiratory variability, suggesting right atrial pressure of 3 mmHg. Comparison(s): No significant change from  prior study. 06/03/2023: LVEF 60-65%. FINDINGS  Left Ventricle: Left ventricular ejection fraction, by estimation, is 60 to 65%. The left ventricle has normal function. The left ventricle has no regional wall motion abnormalities. The left ventricular internal cavity size was normal in size. There is  mild left ventricular hypertrophy. Left ventricular diastolic function could not be evaluated. Right Ventricle: The right ventricular size is normal. No increase in right ventricular wall thickness. Right ventricular systolic function is normal. Left Atrium: Left atrial size was not assessed. Right Atrium: Right atrial size was not  assessed. Pericardium: There is no evidence of pericardial effusion. Mitral Valve: The mitral valve is normal in structure. No evidence of mitral valve regurgitation. Tricuspid Valve: The tricuspid valve is grossly normal. Tricuspid valve regurgitation is not demonstrated. Aortic Valve: The aortic valve was not assessed. Aortic valve regurgitation is not visualized. Pulmonic Valve: The pulmonic valve was not assessed. Pulmonic valve regurgitation is not visualized. Aorta: Aortic root could not be assessed. Venous: The inferior vena cava is normal in size with greater than 50% respiratory variability, suggesting right atrial pressure of 3 mmHg. IAS/Shunts: The interatrial septum was not assessed.  LEFT VENTRICLE PLAX 2D LVIDd:         4.20 cm LVIDs:         2.70 cm LV PW:         1.40 cm LV IVS:        1.20 cm LVOT diam:     1.90 cm LVOT Area:     2.84 cm  RIGHT VENTRICLE RV S prime:     13.20 cm/s TAPSE (M-mode): 1.9 cm LEFT ATRIUM         Index LA diam:    2.80 cm 1.33 cm/m   AORTA Ao Root diam: 2.60 cm  SHUNTS Systemic Diam: 1.90 cm Zoila Shutter MD Electronically signed by Zoila Shutter MD Signature Date/Time: 06/21/2023/1:00:23 PM    Final    DG CHEST PORT 1 VIEW  Result Date: 06/21/2023 CLINICAL DATA:  Chest pain. EXAM: PORTABLE CHEST 1 VIEW COMPARISON:  June 20, 2023 FINDINGS: The heart size and mediastinal contours are within normal limits. A left-sided chest tube is again seen with its distal tip overlying the medial aspect of the left lung base. Mild atelectasis is seen within the bilateral lung bases. A small, stable left pleural effusion is noted. No pneumothorax is identified. A nondisplaced lateral sixth left rib fracture is seen. The visualized skeletal structures are unremarkable. IMPRESSION: 1. Left-sided chest tube positioning, as described above. 2. Mild bibasilar atelectasis. 3. Small left pleural effusion. Electronically Signed   By: Aram Candela M.D.   On: 06/21/2023 03:30   DG  CHEST PORT 1 VIEW  Result Date: 06/20/2023 CLINICAL DATA:  Chest tube EXAM: PORTABLE CHEST 1 VIEW COMPARISON:  Chest x-ray 06/19/2023.  Chest CT 06/19/2023. FINDINGS: New left-sided chest tube is seen in the lower left hemithorax. There is a small left pleural effusion, minimally decreased from prior. No pneumothorax. No new focal lung infiltrate. The cardiomediastinal silhouette is stable. The osseous structures are unchanged. IMPRESSION: New left-sided chest tube. Mild decrease in small left pleural effusion. Electronically Signed   By: Darliss Cheney M.D.   On: 06/20/2023 20:12    Scheduled Meds:  amLODipine  10 mg Oral Daily   benztropine  1 mg Oral q morning   busPIRone  20 mg Oral BID   cariprazine  6 mg Oral Daily   carvedilol  3.125 mg Oral BID WC  doxepin  50 mg Oral QHS   heparin injection (subcutaneous)  5,000 Units Subcutaneous Q8H   pneumococcal 20-valent conjugate vaccine  0.5 mL Intramuscular Tomorrow-1000   senna-docusate  1 tablet Oral BID   sodium chloride flush  10 mL Intrapleural Q8H   sodium chloride flush  3 mL Intravenous Q12H   Continuous Infusions:  piperacillin-tazobactam 3.375 g (06/22/23 1011)   vancomycin 1,500 mg (06/21/23 2335)     LOS: 2 days    Time spent: 35 mins    Willeen Niece, MD [facility]   If 7PM-7AM, please contact night-coverage

## 2023-06-23 DIAGNOSIS — J90 Pleural effusion, not elsewhere classified: Secondary | ICD-10-CM | POA: Diagnosis not present

## 2023-06-23 LAB — CBC
HCT: 35.4 % — ABNORMAL LOW (ref 36.0–46.0)
Hemoglobin: 11.1 g/dL — ABNORMAL LOW (ref 12.0–15.0)
MCH: 24 pg — ABNORMAL LOW (ref 26.0–34.0)
MCHC: 31.4 g/dL (ref 30.0–36.0)
MCV: 76.5 fL — ABNORMAL LOW (ref 80.0–100.0)
Platelets: 431 10*3/uL — ABNORMAL HIGH (ref 150–400)
RBC: 4.63 MIL/uL (ref 3.87–5.11)
RDW: 17.5 % — ABNORMAL HIGH (ref 11.5–15.5)
WBC: 10.6 10*3/uL — ABNORMAL HIGH (ref 4.0–10.5)
nRBC: 0 % (ref 0.0–0.2)

## 2023-06-23 LAB — MAGNESIUM: Magnesium: 2.1 mg/dL (ref 1.7–2.4)

## 2023-06-23 LAB — PHOSPHORUS: Phosphorus: 4.1 mg/dL (ref 2.5–4.6)

## 2023-06-23 MED ORDER — AMOXICILLIN-POT CLAVULANATE 875-125 MG PO TABS: 1.0000 | ORAL_TABLET | Freq: Two times a day (BID) | ORAL | 0 refills | Status: DC

## 2023-06-23 MED ORDER — AMOXICILLIN-POT CLAVULANATE 875-125 MG PO TABS
1.0000 | ORAL_TABLET | Freq: Two times a day (BID) | ORAL | Status: DC
  Filled 2023-06-23: qty 1

## 2023-06-23 MED ORDER — AMOXICILLIN-POT CLAVULANATE 875-125 MG PO TABS: 1.0000 | ORAL_TABLET | Freq: Two times a day (BID) | ORAL | Status: DC

## 2023-06-23 NOTE — Care Management Important Message (Signed)
Important Message  Patient Details  Name: Courtney Carrillo MRN: [id number] Date of Birth: Sep 30, 1965   Important Message Given:  Yes - Medicare IM     Sherilyn Banker 06/23/2023, 2:37 PM

## 2023-06-23 NOTE — Progress Notes (Signed)
   06/22/23 2337  BiPAP/CPAP/SIPAP  BiPAP/CPAP/SIPAP Pt Type Adult  Reason BIPAP/CPAP not in use Non-compliant (patient states she doesnt want to wear it. at bedside if changes her mind)  BiPAP/CPAP /SiPAP Vitals  Pulse Rate 66  SpO2 94 %

## 2023-06-23 NOTE — Evaluation (Signed)
Physical Therapy Brief Evaluation and Discharge Note Patient Details Name: Courtney Carrillo MRN: [id number] DOB: 1966-04-07 Today's Date: 06/23/2023   History of Present Illness  This 57 y.o. female with medical history significant of hypertension, hyperlipidemia, recent pneumonia with parapneumonic effusion, anxiety, depression, obesity, and OSA who presented with complaints of shortness of breath. CTA chest was obtained which ruled out pulmonary embolism but shows left pleural effusion which was partially loculated with compressive atelectasis. Patient who was transferred from [facility] to [facility] and underwent chest tube insertion. Chest tube removed on 11/11  Clinical Impression  Pt presents with admitting diagnosis above. Pt today was able to ambulate independently with no AD and navigate stairs today. PTA pt was fully independent. Pt presents at or near baseline mobility. Pt has no further acute PT needs and will be signing off. Re consult PT if mobility status changes. Pt has no follow up PT needs and anticipates DC home today.     PT Assessment Patient does not need any further PT services  Assistance Needed at Discharge  PRN    Equipment Recommendations None recommended by PT  Recommendations for Other Services       Precautions/Restrictions Precautions Precautions: Fall Restrictions Weight Bearing Restrictions: No        Mobility  Bed Mobility   Supine/Sidelying to sit: Independent Sit to supine/sidelying: Independent    Transfers Overall transfer level: Independent Equipment used: None                    Ambulation/Gait Ambulation/Gait assistance: Independent Gait Distance (Feet): 200 Feet Assistive device: None Gait Pattern/deviations: WFL(Within Functional Limits) Gait Speed: Pace WFL General Gait Details: no LOB noted.  Home Activity Instructions    Stairs Stairs: Yes Stairs assistance: Independent Stair Management: No rails Number of  Stairs: 2 General stair comments: no LOB noted.  Modified Rankin (Stroke Patients Only)        Balance Overall balance assessment: Independent                        Pertinent Vitals/Pain PT - Brief Vital Signs All Vital Signs Stable: Yes Pain Assessment Pain Assessment: No/denies pain     Home Living Family/patient expects to be discharged to:: Private residence Living Arrangements: Children Available Help at Discharge: Family;Available 24 hours/day Home Environment: Stairs to enter;No rail  Stairs-Number of Steps: 2 Home Equipment: Hand held shower head        Prior Function Level of Independence: Independent      UE/LE Assessment   UE ROM/Strength/Tone/Coordination: WFL    LE ROM/Strength/Tone/Coordination: [facility]      Communication   Communication Communication: No apparent difficulties     Cognition Overall Cognitive Status: Appears within functional limits for tasks assessed/performed       General Comments General comments (skin integrity, edema, etc.): 94% on RA.    Exercises     Assessment/Plan    PT Problem List         PT Visit Diagnosis Other abnormalities of gait and mobility (R26.89)    No Skilled PT Patient at baseline level of functioning;Patient is independent with all acitivity/mobility   Co-evaluation                AMPAC 6 Clicks Help needed turning from your back to your side while in a flat bed without using bedrails?: None Help needed moving from lying on your back to sitting on the side of a flat  bed without using bedrails?: None Help needed moving to and from a bed to a chair (including a wheelchair)?: None Help needed standing up from a chair using your arms (e.g., wheelchair or bedside chair)?: None Help needed to walk in hospital room?: None Help needed climbing 3-5 steps with a railing? : None 6 Click Score: 24      End of Session   Activity Tolerance: Patient tolerated treatment well Patient  left: in bed;with call bell/phone within reach;with nursing/sitter in room Nurse Communication: Mobility status PT Visit Diagnosis: Other abnormalities of gait and mobility (R26.89)     Time: 8469-6295 PT Time Calculation (min) (ACUTE ONLY): 11 min  Charges:   PT Evaluation $PT Eval Low Complexity: 1 Low      Andjela Wickes B, PT, DPT Acute Rehab Services [phone]   Gladys Damme  06/23/2023, 11:15 AM

## 2023-06-23 NOTE — Care Management Important Message (Signed)
Important Message  Patient Details  Name: Courtney Carrillo MRN: [id number] Date of Birth: [birth year]   Important Message Given:  Yes - Medicare IM     Dorena Bodo 06/23/2023, 1:25 PM

## 2023-06-23 NOTE — Plan of Care (Signed)

## 2023-06-23 NOTE — Progress Notes (Addendum)
NAME:  Courtney Carrillo, MRN:  [id number], DOB:  12/30/1965, LOS: 3 ADMISSION DATE:  06/20/2023, CONSULTATION DATE:  06/20/2023 REFERRING MD:  Dr. Katrinka Blazing - [facility], CHIEF COMPLAINT:  Pleural effusion    History of Present Illness:  Courtney Carrillo is a 57 y.o. female with a past medical history significant for HTN, HLD, recurrent pneumonia with parapneumonic effusion, anxiety, depression, obesity, and OSA who presented to the ED again with complaints of shortness of breath.  Of note patient was originally seen 9/23 after suffering mechanical fall resulting in left fifth and sixth rib fracture without pneumothorax stabilized in ED and discharged.  Returned to hospital 9/27 with worsening shortness of breath CT noted for left large pleural effusion, treated for pneumonia and underwent thoracentesis with bloody effusion seen.  Again admitted 10/23 with recurrent shortness of breath found to have recurrent effusion treated with repeat thoracentesis.  Both thoracentesis have been exudative per lights criteria.  Patient was admitted per hospitalist service for management of acute hypoxic respiratory failure with sepsis in the setting of loculated effusion. PCCM consulted    Pertinent  Medical History  HTN, HLD, recurrent pneumonia with parapneumonic effusion, anxiety, depression, obesity, and OSA   Significant Hospital Events: Including procedures, antibiotic start and stop dates in addition to other pertinent events   11/9 transferred to [facility] for further evaluation from pulmonology versus cardiothoracic surgery.  Pigtail chest tube placed 11/11 chest tube removed; cytology w/ no malignant cells, abundant lymphocytes present  Interim History / Subjective:  Chest tube removed yesterday. Cytology w/ no malignant cells, abundant lymphocytes present WBC trending down. Now 10.6 from 11.7  Patient states breathing is fine On Room air  Objective   Blood pressure (!) 143/105, pulse 65, temperature 98.6 F (37  C), temperature source Oral, resp. rate 18, height 5\' 5"  (1.651 m), weight 104.6 kg, SpO2 95%.        Intake/Output Summary (Last 24 hours) at 06/23/2023 0915 Last data filed at 06/22/2023 1139 Gross per 24 hour  Intake --  Output 700 ml  Net -700 ml   Filed Weights   06/20/23 1440  Weight: 104.6 kg    Examination: General:  NAD HEENT: MM pink/moist Neuro: Aox3; MAE CV: s1s2, RRR, no m/r/g PULM:  dim clear BS bilaterally; RA GI: soft, bsx4 active  Extremities: warm/dry, no edema  Skin: no rashes or lesions    Resolved Hospital Problem list     Assessment & Plan:   Acute hypoxemic respiratory failure Loculated pleural effusion -chest tube removed 11/12; doing well -cytology w/ no malignant cells, abundant lymphocytes present -light's criteria indicative of exudative effusion Plan: -on room air; sat goal >92% -cont zosyn and vanc per primary -trend wbc/fever curve -follow pleural culture -OOB and PT -consider hematology consult w/ predominate lymphocytes in cytology -pccm will sign off and available as needed  OSA Plan: -cpap qhs  Best Practice (right click and "Reselect all SmartList Selections" daily)  Per primary  Labs   CBC: Recent Labs  Lab 06/19/23 1039 06/20/23 1036 06/21/23 0426 06/22/23 0553 06/23/23 0602  WBC 12.2* 17.5* 16.8* 11.7* 10.6*  NEUTROABS 8.7*  --   --   --   --   HGB 11.4* 10.2* 9.3* 9.9* 11.1*  HCT 37.3 34.0* 30.9* 32.9* 35.4*  MCV 76.7* 78.3* 77.4* 77.6* 76.5*  PLT 524* 488* 459* 437* 431*    Basic Metabolic Panel: Recent Labs  Lab 06/19/23 1039 06/20/23 1036 06/21/23 0426 06/22/23 0553 06/23/23 0602  NA 140 140 138 140  --   K 3.2* 4.8 4.2 3.9  --   CL 102 106 103 103  --   CO2 26 27 27 28   --   GLUCOSE 143* 119* 100* 84  --   BUN 6 12 13 9   --   CREATININE 0.84 0.69 0.82 0.86  --   CALCIUM 9.2 9.4 9.1 9.2  --   MG  --  2.2  --  2.0 2.1  PHOS  --   --   --  3.6 4.1   GFR: Estimated Creatinine Clearance:  86.6 mL/min (by C-G formula based on SCr of 0.86 mg/dL). Recent Labs  Lab 06/19/23 1538 06/19/23 2013 06/20/23 1036 06/20/23 1240 06/21/23 0426 06/21/23 1815 06/22/23 0553 06/23/23 0602  PROCALCITON <0.10  --   --   --   --  <0.10  --   --   WBC  --   --  17.5*  --  16.8*  --  11.7* 10.6*  LATICACIDVEN 2.3* 4.1* 1.0 1.3  --   --   --   --      Margot Chimes [facility] 06/23/2023, 9:23 AM  Please see [email or web address] for pager details.  From 7A-7P if no response, please call [phone]. After hours, please call ELink [phone].

## 2023-06-23 NOTE — Consult Note (Signed)
[facility] Liaison Consult Note    06/23/2023  Courtney Carrillo [birth year] [id number]  [facility] Patient:  Primary Care Provider:  Lorre Munroe, NP this provider is listed to provide the community transition of care follow up and calls  Insurance: [employer] Dual Complete Patient already transitioned from floor to home.  Patient is currently active with [facility] for care coordination services.  Patient has been engaged by a [employer]  The community based plan of care has focused on disease management and community resource support.    Patient will receive a post hospital call and will be evaluated for assessments and disease process education.  Patient has a noted appointment with [facility] HLT RN on 07/17/23.  Plan: Will alert Community [facility] RN of hospitalization and follow up, anticipate follow up with Community TOC team.    Of note, [facility] services does not replace or interfere with any services that are needed or arranged by inpatient [facility] care management team.   Charlesetta Shanks, RN, BSN, CCM [place]  [facility], [facility] Liaison Direct Dial: [phone] or secure chat Email: [email or web address]

## 2023-06-23 NOTE — TOC Transition Note (Signed)
Transition of Care [facility]) - CM/SW Discharge Note   Patient Details  Name: Courtney Carrillo MRN: [id number] Date of Birth: February 15, 1966  Transition of Care [facility]) CM/SW Contact:  Tom-Johnson, Hershal Coria, RN Phone Number: 06/23/2023, 11:11 AM   Clinical Narrative:     Patient is scheduled for discharge today.  Readmission Risk Assessment done. Outpatient f/u, hospital f/u and discharge instructions on AVS. No TOC needs or recommendations noted. Daughter, Bella Kennedy to transport at discharge.  No further TOC needs noted.           Final next level of care: Home/Self Care Barriers to Discharge: Barriers Resolved   Patient Goals and CMS Choice CMS Medicare.gov Compare Post Acute Care list provided to:: Patient Choice offered to / list presented to : NA  Discharge Placement                  Patient to be transferred to facility by: Daughter Name of family member notified: Allyson    Discharge Plan and Services Additional resources added to the After Visit Summary for                  DME Arranged: N/A DME Agency: NA       HH Arranged: NA HH Agency: NA        Social Determinants of Health (SDOH) Interventions SDOH Screenings   Food Insecurity: No Food Insecurity (06/20/2023)  Housing: Patient Declined (06/20/2023)  Transportation Needs: No Transportation Needs (06/20/2023)  Utilities: Not At Risk (06/20/2023)  Alcohol Screen: Low Risk  (01/28/2023)  Depression (PHQ2-9): Low Risk  (06/17/2023)  Financial Resource Strain: Low Risk  (12/25/2022)  Physical Activity: Inactive (06/11/2023)  Social Connections: Socially Isolated (06/11/2023)  Stress: No Stress Concern Present (12/25/2022)  Tobacco Use: Medium Risk (06/20/2023)  Health Literacy: Adequate Health Literacy (06/11/2023)     Readmission Risk Interventions    06/23/2023   11:10 AM  Readmission Risk Prevention Plan  Transportation Screening Complete  PCP or Specialist Appt within 3-5 Days Complete   HRI or Home Care Consult Complete  Social Work Consult for Recovery Care Planning/Counseling Complete  Palliative Care Screening Not Applicable  Medication Review Oceanographer) Referral to Pharmacy

## 2023-06-23 NOTE — Discharge Instructions (Signed)
Advised to follow-up with cardiology as scheduled for outpatient coronary CT scan. Advised to follow-up with hematology for predominant lymphocytes in the pleural fluid.

## 2023-06-23 NOTE — Discharge Summary (Signed)
Physician Discharge Summary  Courtney Carrillo ZDG:[id number] DOB: 02/13/1966 DOA: 06/20/2023  PCP: Lorre Munroe, NP  Admit date: 06/20/2023  Discharge date: 06/23/2023  Admitted From: Home  Disposition:  Home.  Recommendations for Outpatient Follow-up:  Follow up with PCP in 1-2 Zender. Please obtain BMP/CBC in one week. Advised to follow-up with Cardiology as scheduled for outpatient coronary CT scan.. Advised to follow-up with hematology for predominant lymphocytes in the pleural fluid. Advised to take Augmentin 875 twice daily for total 14 days.  Home Health:None Equipment/Devices:None  Discharge Condition: Stable CODE STATUS:Full code Diet recommendation: Heart Healthy  Brief [facility] Course: This 57 y.o. female with medical history significant of hypertension, hyperlipidemia, recent pneumonia with parapneumonic effusion, anxiety, depression, obesity, and OSA who presented with complaints of shortness of breath.  Patient was seen in the ED on 9/23 after fall and was found to have left-sided fifth and sixth rib fractures without pneumothorax.  Patient was discharged with lidocaine,  azithromycin and Augmentin.  Patient returned to the hospital on 9/27 with worsening shortness of breath.  CT showed large pleural effusion with left lower lobe consolidation and compressive atelectasis. He was treated with azithromycin and Rocephin.  Patient presented again in the ED with worsening shortness of breath.  CTA chest was obtained which ruled out pulmonary embolism but shows left pleural effusion which was partially loculated with compressive atelectasis.  Case was discussed with Dr. Cliffton Asters of cardiothoracic surgery who recommended chest tube insertion.  Patient who was transferred from [facility] to [facility] and underwent chest tube insertion. Patient remains on IV antibiotics.  Patient was followed by pulmonology. Patient has made significant improvement subsequently chest tube was  removed.  Pulmonology recommended Patient can be discharged on Augmentin 875 twice daily for total 14-day course.  Patient was also seen by cardiology given elevated troponin,  suspect due to demand ischemia.  Cardiology recommended outpatient CT coronary.  Patient is being discharged home.  Discharge Diagnoses:  Principal Problem:   Loculated pleural effusion Active Problems:   Acute hypoxic respiratory failure (HCC)   Sepsis (HCC)   Troponin level elevated   Hypertensive urgency   Microcytic anemia   Thrombocytosis   Anxiety and depression   Obesity (BMI 30-39.9)   OSA (obstructive sleep apnea)   Primary hypertension   Need for management of chest tube   Recurrent pleural effusion   Pleural effusion exudative   History of tobacco abuse   Acute respiratory failure with hypoxia (HCC)   Recurrent pleural effusion on left  Acute hypoxic respiratory failure: Sepsis secondary to loculated effusion: Patient presented with worsening shortness of breath for last 4 days prior to arrival. Noted to be tachycardic and tachypneic with leucocytosis , lactic acidosis. Patient required high flow nasal cannula to maintain saturation above 92%.  She has had recurrent pleural effusions and treatments after initially had fall with rib fractures back in September.   CT A chest ruled out PE but showed loculated effusion.  Procalcitonin 0.10  Patient initiated on empiric antibiotic vancomycin and Zosyn.   Case was discussed with cardiothoracic surgery Dr. Cliffton Asters who recommended chest tube insertion.   Blood cultures from yesterday show no growth.  Lactic acid has since trended back to within normal limits Continue supplemental oxygen to maintain saturation above 92%. Continued empiric antibiotics ( vancomycin and Zosyn ) Continue Albuterol nebs as needed for shortness of breath/wheezing. Patient underwent successful chest tube insertion on the left side. PCCM is planning to remove chest tube  today  later in the afternoon. Chest tube removed. Pulmonology signed off.  Patient ambulated well without any symptoms. Patient discharged on Augmentin for total 14-day course.   Elevated troponin: High-sensitivity troponins elevated up to 346 ->428, but have since been trending down.   EKG > no significant ischemic changes.   Patient was initiated on IV heparin. Last echo from 10/23 showed EF to be 60 to 65% with normal diastolic parameters at that time.   Suspect possibly due to demand in setting acute respiratory distress. Heparin drip discontinued for possible need of chest tube placement and placed on subcu heparin. Cardiology recommended outpatient coronary CT scan.   Hypertensive urgency: Continue amlodipine and Coreg. Blood pressure has improved.   Microcytic anemia: Hemoglobin 10.2 g/dL which appears around patient's baseline.   Iron studies from 05/08/2023 have been at the lower end of normal. Continue to monitor   Thrombocytosis: Platelet count 488 similar to priors.  Possibly related to possible iron deficiency or reactive to above. Continue to monitor   Anxiety and depression Continue doxepin, BuSpar, and Vraylar.   Obesity BMI 38.36 kg/m. Diet and exercise discussed in detail.    Discharge Instructions  Discharge Instructions     Call MD for:  difficulty breathing, headache or visual disturbances   Complete by: As directed    Call MD for:  persistant dizziness or light-headedness   Complete by: As directed    Call MD for:  persistant nausea and vomiting   Complete by: As directed    Diet - low sodium heart healthy   Complete by: As directed    Diet Carb Modified   Complete by: As directed    Discharge instructions   Complete by: As directed    Advised to follow-up with primary care physician in 1 week. Advised to follow-up with cardiology as scheduled for outpatient coronary CT scan. Advised to follow-up with hematology for predominant lymphocytes in the  pleural fluid.   Increase activity slowly   Complete by: As directed       Allergies as of 06/23/2023       Reactions   Lisinopril Cough   Losartan Cough   Other Other (See Comments)   Perfumes : Congestion        Medication List     STOP taking these medications    predniSONE 10 MG tablet Commonly known as: DELTASONE       TAKE these medications    acetaminophen-codeine 300-30 MG tablet Commonly known as: TYLENOL #3 Take 1 tablet by mouth every 8 (eight) hours as needed for moderate pain (pain score 4-6).   albuterol (2.5 MG/3ML) 0.083% nebulizer solution Commonly known as: PROVENTIL Take 3 mLs (2.5 mg total) by nebulization every 4 (four) hours as needed for wheezing or shortness of breath.   amLODipine 10 MG tablet Commonly known as: NORVASC Take 1 tablet (10 mg total) by mouth daily.   amoxicillin-clavulanate 875-125 MG tablet Commonly known as: AUGMENTIN Take 1 tablet by mouth every 12 (twelve) hours for 12 days.   atorvastatin 40 MG tablet Commonly known as: LIPITOR TAKE 1 TABLET(40 MG) BY MOUTH EVERY MORNING What changed: See the new instructions.   benztropine 1 MG tablet Commonly known as: COGENTIN Take 1 mg by mouth every morning.   budesonide-formoterol 160-4.5 MCG/ACT inhaler Commonly known as: Symbicort Inhale 2 puffs into the lungs 2 (two) times daily.   busPIRone 10 MG tablet Commonly known as: BUSPAR Take 20 mg by mouth 2 (two) times  daily.   carvedilol 3.125 MG tablet Commonly known as: COREG Take 1 tablet (3.125 mg total) by mouth 2 (two) times daily with a meal.   doxepin 50 MG capsule Commonly known as: SINEQUAN Take 50 mg by mouth at bedtime.   lidocaine 5 % Commonly known as: Lidoderm Place 1 patch onto the skin every 12 (twelve) hours. Remove & Discard patch within 12 hours or as directed by MD   Vraylar 6 MG Caps Generic drug: Cariprazine HCl Take 1 capsule by mouth daily.        Follow-up Information      Lorre Munroe, NP Follow up in 1 week(s).   Specialties: Internal Medicine, Emergency Medicine Contact information: [address] [place] [address] [phone]         Marykay Lex, MD Follow up in 1 month(s).   Specialty: Cardiology Contact information: [address] [place] [address] [phone]         Artis Delay, MD Follow up in 1 week(s).   Specialty: Hematology and Oncology Contact information: [address] [place] [address] [phone]                Allergies  Allergen Reactions   Lisinopril Cough   Losartan Cough   Other Other (See Comments)    Perfumes : Congestion    Consultations: Cardiology Pulmonology   Procedures/Studies: DG CHEST PORT 1 VIEW  Result Date: 06/22/2023 CLINICAL DATA:  Left pleural effusion. EXAM: PORTABLE CHEST 1 VIEW COMPARISON:  Chest x-ray from yesterday. FINDINGS: Unchanged chest tube at the posterior left lung base. The heart size and mediastinal contours are within normal limits. Normal pulmonary vascularity. Similar indistinctness of the left hemidiaphragm and blunting of the left costophrenic angle presumably reflecting residual small pleural effusion and adjacent atelectasis. Clear right lung. No pneumothorax. No acute osseous abnormality. IMPRESSION: 1. Unchanged small left pleural effusion and adjacent atelectasis with left chest tube in place. Electronically Signed   By: Obie Dredge M.D.   On: 06/22/2023 12:47   ECHOCARDIOGRAM LIMITED  Result Date: 06/21/2023    ECHOCARDIOGRAM REPORT   Patient Name:   Courtney Carrillo Date of Exam: 06/21/2023 Medical Rec #:  [id number]      Height:       65.0 in Accession #:    [id number]     Weight:       230.5 lb Date of Birth:  September 24, 1965      BSA:          2.101 m Patient Age:    57 years       BP:           123/94 mmHg Patient Gender: F              HR:           64 bpm. Exam Location:  Inpatient Procedure: Limited Echo, Color  Doppler and Cardiac Doppler Indications:    Elevated troponin  History:        Patient has prior history of Echocardiogram examinations, most                 recent 06/03/2023. CHF; Risk Factors:Sleep Apnea, Hypertension                 and Dyslipidemia.  Sonographer:    Milbert Coulter Referring Phys: [id number] RONDELL A SMITH IMPRESSIONS  1. Left ventricular ejection fraction, by estimation, is 60 to 65%. The left ventricle has  normal function. The left ventricle has no regional wall motion abnormalities. There is mild left ventricular hypertrophy. Left ventricular diastolic function could not be evaluated.  2. Right ventricular systolic function is normal. The right ventricular size is normal.  3. The mitral valve is normal in structure. No evidence of mitral valve regurgitation.  4. The aortic valve was not assessed. Aortic valve regurgitation is not visualized.  5. The inferior vena cava is normal in size with greater than 50% respiratory variability, suggesting right atrial pressure of 3 mmHg. Comparison(s): No significant change from prior study. 06/03/2023: LVEF 60-65%. FINDINGS  Left Ventricle: Left ventricular ejection fraction, by estimation, is 60 to 65%. The left ventricle has normal function. The left ventricle has no regional wall motion abnormalities. The left ventricular internal cavity size was normal in size. There is  mild left ventricular hypertrophy. Left ventricular diastolic function could not be evaluated. Right Ventricle: The right ventricular size is normal. No increase in right ventricular wall thickness. Right ventricular systolic function is normal. Left Atrium: Left atrial size was not assessed. Right Atrium: Right atrial size was not assessed. Pericardium: There is no evidence of pericardial effusion. Mitral Valve: The mitral valve is normal in structure. No evidence of mitral valve regurgitation. Tricuspid Valve: The tricuspid valve is grossly normal. Tricuspid valve regurgitation is not  demonstrated. Aortic Valve: The aortic valve was not assessed. Aortic valve regurgitation is not visualized. Pulmonic Valve: The pulmonic valve was not assessed. Pulmonic valve regurgitation is not visualized. Aorta: Aortic root could not be assessed. Venous: The inferior vena cava is normal in size with greater than 50% respiratory variability, suggesting right atrial pressure of 3 mmHg. IAS/Shunts: The interatrial septum was not assessed.  LEFT VENTRICLE PLAX 2D LVIDd:         4.20 cm LVIDs:         2.70 cm LV PW:         1.40 cm LV IVS:        1.20 cm LVOT diam:     1.90 cm LVOT Area:     2.84 cm  RIGHT VENTRICLE RV S prime:     13.20 cm/s TAPSE (M-mode): 1.9 cm LEFT ATRIUM         Index LA diam:    2.80 cm 1.33 cm/m   AORTA Ao Root diam: 2.60 cm  SHUNTS Systemic Diam: 1.90 cm Zoila Shutter MD Electronically signed by Zoila Shutter MD Signature Date/Time: 06/21/2023/1:00:23 PM    Final    DG CHEST PORT 1 VIEW  Result Date: 06/21/2023 CLINICAL DATA:  Chest pain. EXAM: PORTABLE CHEST 1 VIEW COMPARISON:  June 20, 2023 FINDINGS: The heart size and mediastinal contours are within normal limits. A left-sided chest tube is again seen with its distal tip overlying the medial aspect of the left lung base. Mild atelectasis is seen within the bilateral lung bases. A small, stable left pleural effusion is noted. No pneumothorax is identified. A nondisplaced lateral sixth left rib fracture is seen. The visualized skeletal structures are unremarkable. IMPRESSION: 1. Left-sided chest tube positioning, as described above. 2. Mild bibasilar atelectasis. 3. Small left pleural effusion. Electronically Signed   By: Aram Candela M.D.   On: 06/21/2023 03:30   DG CHEST PORT 1 VIEW  Result Date: 06/20/2023 CLINICAL DATA:  Chest tube EXAM: PORTABLE CHEST 1 VIEW COMPARISON:  Chest x-ray 06/19/2023.  Chest CT 06/19/2023. FINDINGS: New left-sided chest tube is seen in the lower left hemithorax. There is a small left  pleural effusion, minimally decreased from prior. No pneumothorax. No new focal lung infiltrate. The cardiomediastinal silhouette is stable. The osseous structures are unchanged. IMPRESSION: New left-sided chest tube. Mild decrease in small left pleural effusion. Electronically Signed   By: Darliss Cheney M.D.   On: 06/20/2023 20:12   CT Angio Chest PE W and/or Wo Contrast  Result Date: 06/19/2023 CLINICAL DATA:  Pulmonary embolism (PE) suspected, high prob Shortness of breath. EXAM: CT ANGIOGRAPHY CHEST WITH CONTRAST TECHNIQUE: Multidetector CT imaging of the chest was performed using the standard protocol during bolus administration of intravenous contrast. Multiplanar CT image reconstructions and MIPs were obtained to evaluate the vascular anatomy. RADIATION DOSE REDUCTION: This exam was performed according to the departmental dose-optimization program which includes automated exposure control, adjustment of the mA and/or kV according to patient size and/or use of iterative reconstruction technique. CONTRAST:  75mL OMNIPAQUE IOHEXOL 350 MG/ML SOLN COMPARISON:  Chest CTA 06/02/2023 FINDINGS: Cardiovascular: There are no filling defects within the pulmonary arteries to suggest pulmonary embolus. Breathing motion artifact limits assessment of the mid lower lung zones. Aortic atherosclerosis without acute aortic findings. Heart is normal in size. No pericardial effusion. Mediastinum/Nodes: No enlarged mediastinal or hilar lymph nodes. No visible thyroid nodule. Patulous esophagus without wall thickening. Lungs/Pleura: Left pleural effusion has diminished from prior exam, however a moderate volume of pleural fluid persists. This is partially loculated anteriorly. Adjacent compressive atelectasis in the lower lobe. No right pleural effusion. No acute airspace disease. No endobronchial lesion. Choose of pulmonary edema. Upper Abdomen: No acute findings. Musculoskeletal: Healing left lateral fifth and sixth rib  fractures with surrounding callus, increased callus formation from prior. No acute osseous findings. Review of the MIP images confirms the above findings. IMPRESSION: 1. No pulmonary embolus. Breathing motion artifact limits assessment of the mid lower lung zones. 2. Left pleural effusion has diminished from prior exam, however a moderate volume of pleural fluid persists. This is partially loculated anteriorly. Adjacent compressive atelectasis in the lower lobe. 3. Healing left lateral fifth and sixth rib fractures. Aortic Atherosclerosis (ICD10-I70.0). Electronically Signed   By: Narda Rutherford M.D.   On: 06/19/2023 21:16   DG Chest 2 View  Result Date: 06/19/2023 CLINICAL DATA:  Provided history: Shortness of breath. Patient on home oxygen since suffering a rib fracture in September. Dyspnea. EXAM: CHEST - 2 VIEW COMPARISON:  Prior chest radiographs 06/03/2023 and earlier. Chest CT 06/02/2023. FINDINGS: Heart size within normal limits. Small residual left pleural effusion, decreased from the prior examination of 06/03/2023. Persistent mild ill-defined opacity at the lateral left lung base. No appreciable airspace consolidation on the right. No evidence of pneumothorax. Known non-acute fractures of the left fifth and sixth ribs, better appreciated on the prior chest CT of 06/02/2023. Degenerative changes of the spine. IMPRESSION: 1. Small residual left pleural effusion, decreased from the prior examination of 06/03/2023. 2. Persistent mild ill-defined opacity at the lateral left lung base. While this could sign 4 reflect atelectasis, pneumonia cannot be excluded. Electronically Signed   By: Jackey Loge D.O.   On: 06/19/2023 12:53   ECHOCARDIOGRAM COMPLETE  Result Date: 06/03/2023    ECHOCARDIOGRAM REPORT   Patient Name:   DAYLEE CERVIN Date of Exam: 06/03/2023 Medical Rec #:  [id number]      Height:       64.2 in Accession #:    [id number]     Weight:       229.5 lb Date of Birth:  08-08-1966  BSA:           2.078 m Patient Age:    57 years       BP:           139/91 mmHg Patient Gender: F              HR:           79 bpm. Exam Location:  [facility] Procedure: 2D Echo, Cardiac Doppler and Color Doppler Indications:     CHF  History:         Patient has no prior history of Echocardiogram examinations.                  CHF; Risk Factors:Sleep Apnea, Hypertension and Dyslipidemia.  Sonographer:     Mikki Harbor Referring Phys:  [id number] KZSW ALESKEROV Diagnosing Phys: Julien Nordmann MD  Sonographer Comments: Patient is obese. IMPRESSIONS  1. Left ventricular ejection fraction, by estimation, is 60 to 65%. The left ventricle has normal function. The left ventricle has no regional wall motion abnormalities. Left ventricular diastolic parameters were normal.  2. Right ventricular systolic function is normal. The right ventricular size is normal. Tricuspid regurgitation signal is inadequate for assessing PA pressure.  3. The mitral valve is normal in structure. No evidence of mitral valve regurgitation. No evidence of mitral stenosis.  4. The aortic valve is normal in structure. Aortic valve regurgitation is not visualized. Aortic valve sclerosis is present, with no evidence of aortic valve stenosis.  5. The inferior vena cava is normal in size with greater than 50% respiratory variability, suggesting right atrial pressure of 3 mmHg. FINDINGS  Left Ventricle: Left ventricular ejection fraction, by estimation, is 60 to 65%. The left ventricle has normal function. The left ventricle has no regional wall motion abnormalities. The left ventricular internal cavity size was normal in size. There is  no left ventricular hypertrophy. Left ventricular diastolic parameters were normal. Right Ventricle: The right ventricular size is normal. No increase in right ventricular wall thickness. Right ventricular systolic function is normal. Tricuspid regurgitation signal is inadequate for assessing PA pressure. Left Atrium: Left atrial  size was normal in size. Right Atrium: Right atrial size was normal in size. Pericardium: There is no evidence of pericardial effusion. Mitral Valve: The mitral valve is normal in structure. No evidence of mitral valve regurgitation. No evidence of mitral valve stenosis. MV peak gradient, 4.4 mmHg. The mean mitral valve gradient is 2.0 mmHg. Tricuspid Valve: The tricuspid valve is normal in structure. Tricuspid valve regurgitation is mild . No evidence of tricuspid stenosis. Aortic Valve: The aortic valve is normal in structure. Aortic valve regurgitation is not visualized. Aortic valve sclerosis is present, with no evidence of aortic valve stenosis. Aortic valve mean gradient measures 6.0 mmHg. Aortic valve peak gradient measures 13.5 mmHg. Aortic valve area, by VTI measures 2.80 cm. Pulmonic Valve: The pulmonic valve was normal in structure. Pulmonic valve regurgitation is not visualized. No evidence of pulmonic stenosis. Aorta: The aortic root is normal in size and structure. Venous: The inferior vena cava is normal in size with greater than 50% respiratory variability, suggesting right atrial pressure of 3 mmHg. IAS/Shunts: No atrial level shunt detected by color flow Doppler.  LEFT VENTRICLE PLAX 2D LVIDd:         4.50 cm   Diastology LVIDs:         2.30 cm   LV e' medial:    9.14 cm/s LV PW:  1.00 cm   LV E/e' medial:  10.5 LV IVS:        1.20 cm   LV e' lateral:   10.80 cm/s LVOT diam:     2.10 cm   LV E/e' lateral: 8.9 LV SV:         95 LV SV Index:   46 LVOT Area:     3.46 cm  RIGHT VENTRICLE RV Basal diam:  2.80 cm RV Mid diam:    2.90 cm RV S prime:     17.60 cm/s TAPSE (M-mode): 2.9 cm LEFT ATRIUM             Index        RIGHT ATRIUM           Index LA diam:        3.60 cm 1.73 cm/m   RA Area:     17.00 cm LA Vol (A2C):   49.7 ml 23.92 ml/m  RA Volume:   41.30 ml  19.88 ml/m LA Vol (A4C):   36.1 ml 17.37 ml/m LA Biplane Vol: 41.2 ml 19.83 ml/m  AORTIC VALVE                     PULMONIC  VALVE AV Area (Vmax):    2.82 cm      PV Vmax:       1.31 m/s AV Area (Vmean):   2.65 cm      PV Peak grad:  6.9 mmHg AV Area (VTI):     2.80 cm AV Vmax:           184.00 cm/s AV Vmean:          113.000 cm/s AV VTI:            0.338 m AV Peak Grad:      13.5 mmHg AV Mean Grad:      6.0 mmHg LVOT Vmax:         150.00 cm/s LVOT Vmean:        86.500 cm/s LVOT VTI:          0.273 m LVOT/AV VTI ratio: 0.81  AORTA Ao Root diam: 3.00 cm MITRAL VALVE MV Area (PHT): 3.01 cm    SHUNTS MV Area VTI:   3.33 cm    Systemic VTI:  0.27 m MV Peak grad:  4.4 mmHg    Systemic Diam: 2.10 cm MV Mean grad:  2.0 mmHg MV Vmax:       1.05 m/s MV Vmean:      67.4 cm/s MV Decel Time: 252 msec MV E velocity: 95.70 cm/s MV A velocity: 94.80 cm/s MV E/A ratio:  1.01 Julien Nordmann MD Electronically signed by Julien Nordmann MD Signature Date/Time: 06/03/2023/6:02:42 PM    Final    US THORACENTESIS ASP PLEURAL SPACE W/IMG GUIDE  Result Date: 06/03/2023 INDICATION: Recurrent left pleural effusion. Request received for diagnostic and therapeutic thoracentesis EXAM: ULTRASOUND GUIDED  THORACENTESIS MEDICATIONS: 8 cc 1% lidocaine COMPLICATIONS: None immediate. PROCEDURE: An ultrasound guided thoracentesis was thoroughly discussed with the patient and questions answered. The benefits, risks, alternatives and complications were also discussed. The patient understands and wishes to proceed with the procedure. Written consent was obtained. Ultrasound was performed to localize and mark an adequate pocket of fluid in the left chest. The area was then prepped and draped in the normal sterile fashion. 1% Lidocaine was used for local anesthesia. Under ultrasound guidance a 6 Fr Safe-T-Centesis catheter was introduced. Thoracentesis was  performed. The catheter was removed and a dressing applied. FINDINGS: A total of approximately 1 L of blood-tinged pleural fluid fluid was removed. Samples were sent to the laboratory as requested by the clinical team.  IMPRESSION: Successful ultrasound guided left thoracentesis yielding 1 L of pleural fluid. Follow-up chest x-ray revealed no evidence of pneumothorax. Procedure performed by Mina Marble, PA-C Electronically Signed   By: Olive Bass M.D.   On: 06/03/2023 16:05   DG Chest Port 1 View  Result Date: 06/03/2023 CLINICAL DATA:  Pleural effusion, status post thoracentesis EXAM: PORTABLE CHEST 1 VIEW COMPARISON:  06/02/2023 FINDINGS: Cardiomegaly. Diminished volume of a layering left pleural effusion, now small. No significant pneumothorax. Right lung normally aerated. No acute osseous findings. IMPRESSION: 1. Diminished volume of a layering left pleural effusion, now small. No significant pneumothorax. 2. Cardiomegaly. Electronically Signed   By: Jearld Lesch M.D.   On: 06/03/2023 14:36   CT Angio Chest PE W and/or Wo Contrast  Result Date: 06/02/2023 CLINICAL DATA:  Shortness of breath EXAM: CT ANGIOGRAPHY CHEST WITH CONTRAST TECHNIQUE: Multidetector CT imaging of the chest was performed using the standard protocol during bolus administration of intravenous contrast. Multiplanar CT image reconstructions and MIPs were obtained to evaluate the vascular anatomy. RADIATION DOSE REDUCTION: This exam was performed according to the departmental dose-optimization program which includes automated exposure control, adjustment of the mA and/or kV according to patient size and/or use of iterative reconstruction technique. CONTRAST:  75mL OMNIPAQUE IOHEXOL 350 MG/ML SOLN COMPARISON:  Chest radiographs dated 06/02/2023, 06/01/2023, CT chest dated 05/08/2023 FINDINGS: Cardiovascular: The study is high quality for the evaluation of pulmonary embolism. There are no filling defects in the central, lobar, segmental or subsegmental pulmonary artery branches to suggest acute pulmonary embolism. Great vessels are normal in course and caliber. Left ventricle appears effaced by the adjacent pleural effusion. No significant  pericardial fluid/thickening. Aortic atherosclerosis. Mediastinum/Nodes: Imaged thyroid gland without nodules meeting criteria for imaging follow-up by size. Normal esophagus. No pathologically enlarged axillary, supraclavicular, mediastinal, or hilar lymph nodes. Lungs/Pleura: The central airways are patent. Subsegmental lingular and left lower lobe atelectasis. No focal consolidation. No pneumothorax. Persistent moderate to large left pleural effusion with loculated components. Upper abdomen: 1.2 cm left upper pole hypoattenuating focus (4:151), likely simple cysts. No specific follow-up imaging recommended. Partially imaged lower pole left kidney demonstrates prominent contour anteriorly (4:164). Musculoskeletal: Healing left anterolateral fifth and sixth rib fractures. Review of the MIP images confirms the above findings. IMPRESSION: 1. No evidence of acute pulmonary embolism. 2. Persistent moderate to large left pleural effusion with loculated components. The left ventricle appears effaced by this pleural effusion. 3. Healing left anterolateral fifth and sixth rib fractures. 4. Partially imaged lower pole left kidney demonstrates prominent contour anteriorly. Recommend further evaluation with renal ultrasound. 5.  Aortic Atherosclerosis (ICD10-I70.0). Electronically Signed   By: Agustin Cree M.D.   On: 06/02/2023 17:36   DG Chest 2 View  Result Date: 06/02/2023 CLINICAL DATA:  Shortness of breath. EXAM: CHEST - 2 VIEW COMPARISON:  X-ray 06/01/2023 FINDINGS: Stable moderate left effusion with some adjacent opacity. No pneumothorax or edema. Normal cardiopericardial silhouette. Degenerative changes along the spine. IMPRESSION: Moderate left effusion. Similar to previous. Please correlate with the history Electronically Signed   By: Karen Kays M.D.   On: 06/02/2023 13:41   DG Chest 2 View  Result Date: 06/01/2023 CLINICAL DATA:  Shortness of breath and wheezing. EXAM: CHEST - 2 VIEW COMPARISON:   05/15/2023 FINDINGS: Unchanged cardiomediastinal  contours. Moderate left pleural effusion with overlying subsegmental atelectasis is similar in volume to the previous exam. Right lung appears clear. Central airway thickening noted bilaterally. No acute osseous findings. IMPRESSION: 1. Moderate left pleural effusion with overlying subsegmental atelectasis. Similar in volume to 05/15/2023. 2. Central airway thickening compatible with bronchitis. Electronically Signed   By: Signa Kell M.D.   On: 06/01/2023 06:31     Subjective: Patient was seen and examined at bedside.  Overnight events noted.   Patient reports doing much better. Chest tube was removed yesterday. Patient felt much improved afterwards.  Discharge Exam: Vitals:   06/23/23 0400 06/23/23 0624  BP:  (!) 143/105  Pulse:    Resp:  18  Temp:  98.6 F (37 C)  SpO2: 95%    Vitals:   06/22/23 2337 06/23/23 0000 06/23/23 0400 06/23/23 0624  BP:    (!) 143/105  Pulse: 66 65    Resp:    18  Temp:    98.6 F (37 C)  TempSrc:    Oral  SpO2: 94% 96% 95%   Weight:      Height:        General: Pt is alert, awake, not in acute distress Cardiovascular: RRR, S1/S2 +, no rubs, no gallops Respiratory: CTA bilaterally, no wheezing, no rhonchi Abdominal: Soft, NT, ND, bowel sounds + Extremities: no edema, no cyanosis    The results of significant diagnostics from this hospitalization (including imaging, microbiology, ancillary and laboratory) are listed below for reference.     Microbiology: Recent Results (from the past 240 hour(s))  Blood culture (routine x 2)     Status: None (Preliminary result)   Collection Time: 06/19/23  3:38 PM   Specimen: BLOOD  Result Value Ref Range Status   Specimen Description BLOOD BLOOD LEFT ARM  Final   Special Requests   Final    BOTTLES DRAWN AEROBIC AND ANAEROBIC Blood Culture results may not be optimal due to an excessive volume of blood received in culture bottles   Culture  Setup Time  ANAEROBIC BOTTLE ONLY  Final   Culture   Final    NO GROWTH 4 DAYS Performed at [facility], [address], [place], [place] [address]    Report Status PENDING  Incomplete  Blood culture (routine x 2)     Status: None (Preliminary result)   Collection Time: 06/19/23  3:38 PM   Specimen: BLOOD  Result Value Ref Range Status   Specimen Description   Final    BLOOD BLOOD LEFT ARM Performed at [facility], [address], [place], [place] [address]    Special Requests   Final    BOTTLES DRAWN AEROBIC AND ANAEROBIC Blood Culture results may not be optimal due to an excessive volume of blood received in culture bottles Performed at [facility], [address] [place], [place] [address]    Culture  Setup Time   Final    AEROBIC BOTTLE ONLY Performed at [facility], [address] [place], [place] [address]    Culture   Final    NO GROWTH 3 DAYS Performed at [facility], [address], [place], [place] [address]    Report Status PENDING  Incomplete  Gram stain     Status: None   Collection Time: 06/20/23  6:25 PM   Specimen: Pleura  Result Value Ref Range Status   Specimen Description PLEURAL  Final   Special Requests NONE  Final  Gram Stain   Final    WBC PRESENT, PREDOMINANTLY MONONUCLEAR NO ORGANISMS SEEN CYTOSPIN SMEAR Performed at [facility], [address], [place], [place] [address]    Report Status 06/20/2023 FINAL  Final  Culture, body fluid w Gram Stain-bottle     Status: None (Preliminary result)   Collection Time: 06/20/23  6:25 PM   Specimen: Pleura  Result Value Ref Range Status   Specimen Description PLEURAL  Final   Special Requests NONE  Final   Culture   Final    NO GROWTH 3 DAYS Performed at [facility], [address], [place], [place] [address]    Report Status PENDING  Incomplete     Labs: BNP (last 3 results) Recent Labs    05/08/23 1511 06/02/23 1046 06/19/23 1538  BNP  32.8 28.0 54.2   Basic Metabolic Panel: Recent Labs  Lab 06/19/23 1039 06/20/23 1036 06/21/23 0426 06/22/23 0553 06/23/23 0602  NA 140 140 138 140  --   K 3.2* 4.8 4.2 3.9  --   CL 102 106 103 103  --   CO2 26 27 27 28   --   GLUCOSE 143* 119* 100* 84  --   BUN 6 12 13 9   --   CREATININE 0.84 0.69 0.82 0.86  --   CALCIUM 9.2 9.4 9.1 9.2  --   MG  --  2.2  --  2.0 2.1  PHOS  --   --   --  3.6 4.1   Liver Function Tests: Recent Labs  Lab 06/19/23 1039 06/20/23 1953  AST 21  --   ALT 18  --   ALKPHOS 84  --   BILITOT 0.4  --   PROT 7.9 7.2  ALBUMIN 4.0 3.4*   No results for input(s): "LIPASE", "AMYLASE" in the last 168 hours. No results for input(s): "AMMONIA" in the last 168 hours. CBC: Recent Labs  Lab 06/19/23 1039 06/20/23 1036 06/21/23 0426 06/22/23 0553 06/23/23 0602  WBC 12.2* 17.5* 16.8* 11.7* 10.6*  NEUTROABS 8.7*  --   --   --   --   HGB 11.4* 10.2* 9.3* 9.9* 11.1*  HCT 37.3 34.0* 30.9* 32.9* 35.4*  MCV 76.7* 78.3* 77.4* 77.6* 76.5*  PLT 524* 488* 459* 437* 431*   Cardiac Enzymes: No results for input(s): "CKTOTAL", "CKMB", "CKMBINDEX", "TROPONINI" in the last 168 hours. BNP: Invalid input(s): "POCBNP" CBG: No results for input(s): "GLUCAP" in the last 168 hours. D-Dimer No results for input(s): "DDIMER" in the last 72 hours. Hgb A1c No results for input(s): "HGBA1C" in the last 72 hours. Lipid Profile Recent Labs    06/22/23 0846  CHOL 169  HDL 54  LDLCALC 86  TRIG 147  CHOLHDL 3.1   Thyroid function studies No results for input(s): "TSH", "T4TOTAL", "T3FREE", "THYROIDAB" in the last 72 hours.  Invalid input(s): "FREET3" Anemia work up No results for input(s): "VITAMINB12", "FOLATE", "FERRITIN", "TIBC", "IRON", "RETICCTPCT" in the last 72 hours. Urinalysis    Component Value Date/Time   COLORURINE YELLOW (A) 05/10/2023 0907   APPEARANCEUR HAZY (A) 05/10/2023 0907   LABSPEC 1.018 05/10/2023 0907   PHURINE 5.0 05/10/2023 0907    GLUCOSEU NEGATIVE 05/10/2023 0907   HGBUR NEGATIVE 05/10/2023 0907   BILIRUBINUR NEGATIVE 05/10/2023 0907   KETONESUR NEGATIVE 05/10/2023 0907   PROTEINUR NEGATIVE 05/10/2023 0907   NITRITE NEGATIVE 05/10/2023 0907   LEUKOCYTESUR NEGATIVE 05/10/2023 0907   Sepsis Labs Recent Labs  Lab 06/20/23 1036 06/21/23  1610 06/22/23 0553 06/23/23 0602  WBC 17.5* 16.8* 11.7* 10.6*   Microbiology Recent Results (from the past 240 hour(s))  Blood culture (routine x 2)     Status: None (Preliminary result)   Collection Time: 06/19/23  3:38 PM   Specimen: BLOOD  Result Value Ref Range Status   Specimen Description BLOOD BLOOD LEFT ARM  Final   Special Requests   Final    BOTTLES DRAWN AEROBIC AND ANAEROBIC Blood Culture results may not be optimal due to an excessive volume of blood received in culture bottles   Culture  Setup Time ANAEROBIC BOTTLE ONLY  Final   Culture   Final    NO GROWTH 4 DAYS Performed at [facility], [address], [place], [place] [address]    Report Status PENDING  Incomplete  Blood culture (routine x 2)     Status: None (Preliminary result)   Collection Time: 06/19/23  3:38 PM   Specimen: BLOOD  Result Value Ref Range Status   Specimen Description   Final    BLOOD BLOOD LEFT ARM Performed at [facility], [address], [place], [place] [address]    Special Requests   Final    BOTTLES DRAWN AEROBIC AND ANAEROBIC Blood Culture results may not be optimal due to an excessive volume of blood received in culture bottles Performed at [facility], [address] [place], [place] [address]    Culture  Setup Time   Final    AEROBIC BOTTLE ONLY Performed at [facility], [address], [place], [place] [address]    Culture   Final    NO GROWTH 3 DAYS Performed at [facility], [address], [place], [place] [address]    Report Status PENDING  Incomplete  Gram stain     Status: None   Collection Time: 06/20/23   6:25 PM   Specimen: Pleura  Result Value Ref Range Status   Specimen Description PLEURAL  Final   Special Requests NONE  Final   Gram Stain   Final    WBC PRESENT, PREDOMINANTLY MONONUCLEAR NO ORGANISMS SEEN CYTOSPIN SMEAR Performed at [facility], [address], [place], [place] [address]    Report Status 06/20/2023 FINAL  Final  Culture, body fluid w Gram Stain-bottle     Status: None (Preliminary result)   Collection Time: 06/20/23  6:25 PM   Specimen: Pleura  Result Value Ref Range Status   Specimen Description PLEURAL  Final   Special Requests NONE  Final   Culture   Final    NO GROWTH 3 DAYS Performed at [facility], [address], [place], [place] [address]    Report Status PENDING  Incomplete     Time coordinating discharge: Over 30 minutes  SIGNED:   Willeen Niece, MD  [facility] 06/23/2023, 2:57 PM Pager   If 7PM-7AM, please contact night-coverage

## 2023-06-23 NOTE — Plan of Care (Signed)
  Problem: Education: Goal: Knowledge of General Education information will improve Description: Including pain rating scale, medication(s)/side effects and non-pharmacologic comfort measures 06/23/2023 1100 by Herma Carson, RN Outcome: Adequate for Discharge 06/23/2023 0729 by Herma Carson, RN Outcome: Progressing   Problem: Health Behavior/Discharge Planning: Goal: Ability to manage health-related needs will improve 06/23/2023 1100 by Herma Carson, RN Outcome: Adequate for Discharge 06/23/2023 0729 by Herma Carson, RN Outcome: Progressing   Problem: Clinical Measurements: Goal: Ability to maintain clinical measurements within normal limits will improve 06/23/2023 1100 by Herma Carson, RN Outcome: Adequate for Discharge 06/23/2023 0729 by Herma Carson, RN Outcome: Progressing Goal: Will remain free from infection 06/23/2023 1100 by Herma Carson, RN Outcome: Adequate for Discharge 06/23/2023 0729 by Herma Carson, RN Outcome: Progressing Goal: Diagnostic test results will improve 06/23/2023 1100 by Herma Carson, RN Outcome: Adequate for Discharge 06/23/2023 0729 by Herma Carson, RN Outcome: Progressing Goal: Respiratory complications will improve 06/23/2023 1100 by Herma Carson, RN Outcome: Adequate for Discharge 06/23/2023 0729 by Herma Carson, RN Outcome: Progressing Goal: Cardiovascular complication will be avoided 06/23/2023 1100 by Herma Carson, RN Outcome: Adequate for Discharge 06/23/2023 0729 by Herma Carson, RN Outcome: Progressing   Problem: Activity: Goal: Risk for activity intolerance will decrease 06/23/2023 1100 by Herma Carson, RN Outcome: Adequate for Discharge 06/23/2023 0729 by Herma Carson, RN Outcome: Progressing   Problem: Nutrition: Goal: Adequate nutrition will be maintained 06/23/2023 1100 by Herma Carson, RN Outcome: Adequate for Discharge 06/23/2023 0729 by Herma Carson, RN Outcome:  Progressing   Problem: Coping: Goal: Level of anxiety will decrease 06/23/2023 1100 by Herma Carson, RN Outcome: Adequate for Discharge 06/23/2023 0729 by Herma Carson, RN Outcome: Progressing   Problem: Elimination: Goal: Will not experience complications related to bowel motility 06/23/2023 1100 by Herma Carson, RN Outcome: Adequate for Discharge 06/23/2023 0729 by Herma Carson, RN Outcome: Progressing Goal: Will not experience complications related to urinary retention 06/23/2023 1100 by Herma Carson, RN Outcome: Adequate for Discharge 06/23/2023 0729 by Herma Carson, RN Outcome: Progressing   Problem: Pain Management: Goal: General experience of comfort will improve 06/23/2023 1100 by Herma Carson, RN Outcome: Adequate for Discharge 06/23/2023 0729 by Herma Carson, RN Outcome: Progressing   Problem: Safety: Goal: Ability to remain free from injury will improve 06/23/2023 1100 by Herma Carson, RN Outcome: Adequate for Discharge 06/23/2023 0729 by Herma Carson, RN Outcome: Progressing   Problem: Skin Integrity: Goal: Risk for impaired skin integrity will decrease 06/23/2023 1100 by Herma Carson, RN Outcome: Adequate for Discharge 06/23/2023 0729 by Herma Carson, RN Outcome: Progressing

## 2023-06-24 ENCOUNTER — Other Ambulatory Visit: Payer: Self-pay | Admitting: Internal Medicine

## 2023-06-24 ENCOUNTER — Telehealth: Payer: Self-pay

## 2023-06-24 ENCOUNTER — Other Ambulatory Visit: Payer: Self-pay

## 2023-06-24 DIAGNOSIS — R0602 Shortness of breath: Secondary | ICD-10-CM | POA: Diagnosis not present

## 2023-06-24 DIAGNOSIS — J90 Pleural effusion, not elsewhere classified: Secondary | ICD-10-CM | POA: Diagnosis not present

## 2023-06-24 DIAGNOSIS — G4733 Obstructive sleep apnea (adult) (pediatric): Secondary | ICD-10-CM | POA: Diagnosis not present

## 2023-06-24 LAB — CULTURE, BLOOD (ROUTINE X 2)
Culture: NO GROWTH
Culture: NO GROWTH

## 2023-06-24 MED ORDER — LIDOCAINE 5 % EX PTCH: 1.0000 | MEDICATED_PATCH | Freq: Two times a day (BID) | CUTANEOUS | 0 refills | Status: DC

## 2023-06-24 MED ORDER — COMP AIR COMPRESSOR NEBULIZER MISC
1.0000 | 0 refills | Status: DC
  Filled 2023-06-24: qty 1, 30d supply, fill #0

## 2023-06-24 NOTE — Patient Outreach (Signed)
Care Management  Transitions of Care Program Transitions of Care Post-discharge Initial   06/24/2023 Name: Courtney Carrillo MRN: [id number] DOB: 08-04-1966  Subjective: Courtney Carrillo is a 57 y.o. year old female who is a primary care patient of Lorre Munroe, NP. The Care Management team Engaged with patient Engaged with patient by telephone to assess and address transitions of care needs.   Consent to Services:  Patient was given information about care management services, agreed to services, and gave verbal consent to participate.   Assessment:  Date of Discharge: 06/23/23 Discharge Facility: [facility]) Type of Discharge: Inpatient Admission Primary Inpatient Discharge Diagnosis:: Pleural effusion  SDOH Interventions    Flowsheet Row Telephone from 06/24/2023 in [place] POPULATION HEALTH DEPARTMENT Admission (Discharged) from 06/20/2023 in [place]  [facility] Patient Outreach from 06/11/2023 in [place] POPULATION HEALTH DEPARTMENT Office Visit from 01/28/2023 in [facility] Clinical Support from 12/25/2022 in [facility] Office Visit from 07/11/2022 in [facility]  SDOH Interventions        Food Insecurity Interventions Intervention Not Indicated -- -- -- Intervention Not Indicated --  Housing Interventions -- -- -- -- Intervention Not Indicated --  Transportation Interventions Intervention Not Indicated Intervention Not Indicated, Inpatient TOC, Patient Resources (Friends/Family) -- -- Intervention Not Indicated --  Utilities Interventions -- -- -- -- Intervention Not Indicated --  Alcohol Usage Interventions -- -- -- -- Intervention Not Indicated (Score <7) --  Depression Interventions/Treatment  -- -- -- Currently on Treatment -- Currently on Treatment  Financial Strain Interventions -- -- -- -- Intervention Not Indicated --  Physical Activity Interventions -- -- Intervention  Not Indicated  [limited by breathing] -- Patient Declined --  Stress Interventions -- -- -- -- Intervention Not Indicated --  Social Connections Interventions -- -- Intervention Not Indicated -- Intervention Not Indicated --  Health Literacy Interventions -- -- Intervention Not Indicated -- -- --        Goals Addressed             This Visit's Progress    Patient Stated       Current Barriers:  Knowledge Deficits related to plan of care for management of HTN and Pleural Effusion   RNCM Clinical Goal(s):  Patient will work with the Care Management team over the next 30 days to address Transition of Care Barriers: Medication Management Diet/Nutrition/Food Resources Support at home Provider appointments through collaboration with RN Care manager, provider, and care team.   Interventions: Evaluation of current treatment plan related to  self management and patient's adherence to plan as established by provider   Hypertension Interventions:  (Status:  New goal.) Short Term Goal Last practice recorded BP readings:  BP Readings from Last 3 Encounters:  06/23/23 (!) 143/105  06/20/23 (!) 144/90  06/19/23 125/84   Most recent eGFR/CrCl:  Lab Results  Component Value Date   EGFR 87 01/28/2023    No components found for: "CRCL"  Evaluation of current treatment plan related to hypertension self management and patient's adherence to plan as established by provider Reviewed medications with patient and discussed importance of compliance Assessed social determinant of health barriers  Patient Goals/Self-Care Activities: Participate in Transition of Care Program/Attend [facility] scheduled calls Notify RN Care Manager of TOC call rescheduling needs Take all medications as prescribed Attend all scheduled provider appointments  Follow Up Plan:  Telephone follow up appointment with care management  team member scheduled for:  Wednesday November 20th at [employer] with the  patient by phone today. She has been having ongoing Pleural effusion complications for over a month. She had a chest tube in the hospital which seemed to help improve her situation. She was discharged home on a nebulizer and medications. RNCM reviewed with her how to use it and some side effects of the medication. Reviewed cardiac medications. The patient would like a refill on Lidocaine patches for rib pain. Message sent to the provider. The patient is also enrolled in the Complex Case Management program.   Courtney Ala, RN RN Care Manager [facility] [phone]

## 2023-06-25 LAB — CULTURE, BODY FLUID W GRAM STAIN -BOTTLE: Culture: NO GROWTH

## 2023-06-29 ENCOUNTER — Emergency Department: Payer: 59

## 2023-06-29 ENCOUNTER — Other Ambulatory Visit: Payer: Self-pay

## 2023-06-29 ENCOUNTER — Inpatient Hospital Stay
Admission: EM | Admit: 2023-06-29 | Discharge: 2023-07-02 | DRG: 190 | Disposition: A | Discharge status: Home / Self Care | Payer: 59 | Attending: Internal Medicine | Admitting: Internal Medicine

## 2023-06-29 ENCOUNTER — Encounter: Payer: Self-pay | Admitting: Emergency Medicine

## 2023-06-29 DIAGNOSIS — J441 Chronic obstructive pulmonary disease with (acute) exacerbation: Principal | ICD-10-CM

## 2023-06-29 DIAGNOSIS — J9621 Acute and chronic respiratory failure with hypoxia: Secondary | ICD-10-CM | POA: Diagnosis present

## 2023-06-29 DIAGNOSIS — Z91048 Other nonmedicinal substance allergy status: Secondary | ICD-10-CM | POA: Diagnosis not present

## 2023-06-29 DIAGNOSIS — E785 Hyperlipidemia, unspecified: Secondary | ICD-10-CM | POA: Diagnosis present

## 2023-06-29 DIAGNOSIS — R6889 Other general symptoms and signs: Secondary | ICD-10-CM | POA: Diagnosis not present

## 2023-06-29 DIAGNOSIS — Z87891 Personal history of nicotine dependence: Secondary | ICD-10-CM

## 2023-06-29 DIAGNOSIS — F419 Anxiety disorder, unspecified: Secondary | ICD-10-CM | POA: Diagnosis present

## 2023-06-29 DIAGNOSIS — Z7951 Long term (current) use of inhaled steroids: Secondary | ICD-10-CM

## 2023-06-29 DIAGNOSIS — R0689 Other abnormalities of breathing: Secondary | ICD-10-CM | POA: Diagnosis not present

## 2023-06-29 DIAGNOSIS — R918 Other nonspecific abnormal finding of lung field: Secondary | ICD-10-CM | POA: Diagnosis not present

## 2023-06-29 DIAGNOSIS — Z888 Allergy status to other drugs, medicaments and biological substances status: Secondary | ICD-10-CM

## 2023-06-29 DIAGNOSIS — R0602 Shortness of breath: Secondary | ICD-10-CM | POA: Diagnosis not present

## 2023-06-29 DIAGNOSIS — K219 Gastro-esophageal reflux disease without esophagitis: Secondary | ICD-10-CM | POA: Diagnosis not present

## 2023-06-29 DIAGNOSIS — Z79899 Other long term (current) drug therapy: Secondary | ICD-10-CM | POA: Diagnosis not present

## 2023-06-29 DIAGNOSIS — D75839 Thrombocytosis, unspecified: Secondary | ICD-10-CM | POA: Diagnosis not present

## 2023-06-29 DIAGNOSIS — Z6830 Body mass index (BMI) 30.0-30.9, adult: Secondary | ICD-10-CM

## 2023-06-29 DIAGNOSIS — F32A Depression, unspecified: Secondary | ICD-10-CM | POA: Diagnosis present

## 2023-06-29 DIAGNOSIS — J9601 Acute respiratory failure with hypoxia: Principal | ICD-10-CM | POA: Diagnosis present

## 2023-06-29 DIAGNOSIS — G4733 Obstructive sleep apnea (adult) (pediatric): Secondary | ICD-10-CM | POA: Diagnosis not present

## 2023-06-29 DIAGNOSIS — E669 Obesity, unspecified: Secondary | ICD-10-CM | POA: Diagnosis present

## 2023-06-29 DIAGNOSIS — I10 Essential (primary) hypertension: Secondary | ICD-10-CM | POA: Diagnosis not present

## 2023-06-29 DIAGNOSIS — Z8701 Personal history of pneumonia (recurrent): Secondary | ICD-10-CM | POA: Diagnosis not present

## 2023-06-29 DIAGNOSIS — E876 Hypokalemia: Secondary | ICD-10-CM | POA: Diagnosis present

## 2023-06-29 DIAGNOSIS — J984 Other disorders of lung: Secondary | ICD-10-CM | POA: Diagnosis not present

## 2023-06-29 DIAGNOSIS — F319 Bipolar disorder, unspecified: Secondary | ICD-10-CM | POA: Diagnosis present

## 2023-06-29 DIAGNOSIS — Z743 Need for continuous supervision: Secondary | ICD-10-CM | POA: Diagnosis not present

## 2023-06-29 DIAGNOSIS — J9602 Acute respiratory failure with hypercapnia: Secondary | ICD-10-CM | POA: Diagnosis not present

## 2023-06-29 DIAGNOSIS — R069 Unspecified abnormalities of breathing: Secondary | ICD-10-CM | POA: Diagnosis not present

## 2023-06-29 DIAGNOSIS — F411 Generalized anxiety disorder: Secondary | ICD-10-CM | POA: Diagnosis present

## 2023-06-29 DIAGNOSIS — J9622 Acute and chronic respiratory failure with hypercapnia: Secondary | ICD-10-CM | POA: Diagnosis present

## 2023-06-29 DIAGNOSIS — Z66 Do not resuscitate: Secondary | ICD-10-CM | POA: Diagnosis present

## 2023-06-29 LAB — COMPREHENSIVE METABOLIC PANEL
ALT: 20 U/L (ref 0–44)
AST: 17 U/L (ref 15–41)
Albumin: 4.4 g/dL (ref 3.5–5.0)
Alkaline Phosphatase: 66 U/L (ref 38–126)
Anion gap: 9 (ref 5–15)
BUN: 14 mg/dL (ref 6–20)
CO2: 29 mmol/L (ref 22–32)
Calcium: 9.3 mg/dL (ref 8.9–10.3)
Chloride: 102 mmol/L (ref 98–111)
Creatinine, Ser: 0.99 mg/dL (ref 0.44–1.00)
GFR, Estimated: >60 mL/min (ref >60)
Glucose, Bld: 118 mg/dL — ABNORMAL HIGH (ref 70–99)
Potassium: 2.9 mmol/L — ABNORMAL LOW (ref 3.5–5.1)
Sodium: 140 mmol/L (ref 135–145)
Total Bilirubin: 0.5 mg/dL (ref <1.2)
Total Protein: 8.1 g/dL (ref 6.5–8.1)

## 2023-06-29 LAB — BLOOD GAS, VENOUS
Acid-Base Excess: 5.2 mmol/L — ABNORMAL HIGH (ref 0.0–2.0)
Bicarbonate: 32.9 mmol/L — ABNORMAL HIGH (ref 20.0–28.0)
O2 Saturation: 49.5 %
Patient temperature: 37
pCO2, Ven: 61 mm[Hg] — ABNORMAL HIGH (ref 44–60)
pH, Ven: 7.34 (ref 7.25–7.43)
pO2, Ven: 34 mm[Hg] (ref 32–45)

## 2023-06-29 LAB — BRAIN NATRIURETIC PEPTIDE: B Natriuretic Peptide: 72.1 pg/mL (ref 0.0–100.0)

## 2023-06-29 LAB — TROPONIN I (HIGH SENSITIVITY)
Troponin I (High Sensitivity): 12 ng/L (ref <18)
Troponin I (High Sensitivity): 19 ng/L — ABNORMAL HIGH (ref <18)

## 2023-06-29 LAB — CBC
HCT: 38.3 % (ref 36.0–46.0)
Hemoglobin: 11.7 g/dL — ABNORMAL LOW (ref 12.0–15.0)
MCH: 23.8 pg — ABNORMAL LOW (ref 26.0–34.0)
MCHC: 30.5 g/dL (ref 30.0–36.0)
MCV: 77.8 fL — ABNORMAL LOW (ref 80.0–100.0)
Platelets: 421 10*3/uL — ABNORMAL HIGH (ref 150–400)
RBC: 4.92 MIL/uL (ref 3.87–5.11)
RDW: 18 % — ABNORMAL HIGH (ref 11.5–15.5)
WBC: 18.8 10*3/uL — ABNORMAL HIGH (ref 4.0–10.5)
nRBC: 0.1 % (ref 0.0–0.2)

## 2023-06-29 LAB — D-DIMER, QUANTITATIVE: D-Dimer, Quant: 1.23 ug{FEU}/mL — ABNORMAL HIGH (ref 0.00–0.50)

## 2023-06-29 MED ORDER — HALOPERIDOL LACTATE 5 MG/ML IJ SOLN
5.0000 mg | Freq: Once | INTRAMUSCULAR | Status: AC
  Administered 2023-06-29: 5 mg via INTRAMUSCULAR
  Filled 2023-06-29: qty 1

## 2023-06-29 MED ORDER — IPRATROPIUM-ALBUTEROL 0.5-2.5 (3) MG/3ML IN SOLN
3.0000 mL | Freq: Once | RESPIRATORY_TRACT | Status: AC
  Administered 2023-06-29: 3 mL via RESPIRATORY_TRACT
  Filled 2023-06-29: qty 3

## 2023-06-29 MED ORDER — MIDAZOLAM HCL 2 MG/2ML IJ SOLN
1.0000 mg | Freq: Once | INTRAMUSCULAR | Status: AC
  Administered 2023-06-29: 1 mg via INTRAVENOUS
  Filled 2023-06-29: qty 2

## 2023-06-29 MED ORDER — IOHEXOL 350 MG/ML SOLN
100.0000 mL | Freq: Once | INTRAVENOUS | Status: AC | PRN
  Administered 2023-06-29: 75 mL via INTRAVENOUS

## 2023-06-29 MED ORDER — METHYLPREDNISOLONE SODIUM SUCC 125 MG IJ SOLR
125.0000 mg | Freq: Once | INTRAMUSCULAR | Status: AC
Start: 2023-06-29 — End: 2023-06-29
  Administered 2023-06-29: 125 mg via INTRAVENOUS
  Filled 2023-06-29: qty 2

## 2023-06-29 NOTE — ED Notes (Signed)
Pt taken to room 11 via w/c; O2 in place at 4l/min via [place]; report given to B Biomedical engineer

## 2023-06-29 NOTE — ED Notes (Signed)
First nurse note: Pt here via [facility] with c/o of SOB.  EMS gave 1 duoneb and 1 albuterol   96% RA 190/106 HR: 96-114 (after TX)

## 2023-06-29 NOTE — Progress Notes (Signed)
Attempted to place pt on Bipap, pt unable to tolerate at this time. RN at bedside. Bipap left at bedside.

## 2023-06-29 NOTE — ED Triage Notes (Addendum)
Pt presnts to ED for c/o SOB and feeling like she can't catch her breath. Reports recently discharged from [facility] for pneumonia and sent home with oxygen, wears 2L normally.  85% on RA at time of triage.

## 2023-06-29 NOTE — ED Provider Notes (Signed)
[facility] Provider Note    Event Date/Time   First MD Initiated Contact with Patient 06/29/23 2014     (approximate)   History   Shortness of Breath   HPI  Courtney Carrillo is a 57 y.o. female with extensive past medical history including COPD with recent admission for pneumonia and pleural effusions typically on 2 L nasal cannula presents to the ER for evaluation of worsening shortness of breath.  Not currently on anticoagulation.  Feels like she is having trouble catching her breath over the past day or 2.  No wheezing.     Physical Exam   Triage Vital Signs: ED Triage Vitals  Encounter Vitals Group     BP 06/29/23 1852 (!) 163/86     Systolic BP Percentile --      Diastolic BP Percentile --      Pulse Rate 06/29/23 1852 (!) 102     Resp 06/29/23 1852 (!) 22     Temp 06/29/23 1852 98.4 F (36.9 C)     Temp Source 06/29/23 1852 Oral     SpO2 06/29/23 1852 92 %     Weight --      Height --      Head Circumference --      Peak Flow --      Pain Score 06/29/23 1853 0     Pain Loc --      Pain Education --      Exclude from Growth Chart --     Most recent vital signs: Vitals:   06/29/23 2230 06/29/23 2300  BP:  (!) 148/70  Pulse: (!) 105 98  Resp: 15 19  Temp:    SpO2: 100% 100%     Constitutional: Alert  Eyes: Conjunctivae are normal.  Head: Atraumatic. Nose: No congestion/rhinnorhea. Mouth/Throat: Mucous membranes are moist.   Neck: Painless ROM.  Cardiovascular:   Good peripheral circulation. Respiratory: Mild tachypnea with diminished breath sounds throughout. Gastrointestinal: Soft and nontender.  Musculoskeletal:  no deformity Neurologic:  MAE spontaneously. No gross focal neurologic deficits are appreciated.  Skin:  Skin is warm, dry and intact. No rash noted. Psychiatric: Mood and affect are normal. Speech and behavior are normal.    ED Results / Procedures / Treatments   Labs (all labs ordered are listed, but  only abnormal results are displayed) Labs Reviewed  CBC - Abnormal; Notable for the following components:      Result Value   WBC 18.8 (*)    Hemoglobin 11.7 (*)    MCV 77.8 (*)    MCH 23.8 (*)    RDW 18.0 (*)    Platelets 421 (*)    All other components within normal limits  COMPREHENSIVE METABOLIC PANEL - Abnormal; Notable for the following components:   Potassium 2.9 (*)    Glucose, Bld 118 (*)    All other components within normal limits  D-DIMER, QUANTITATIVE - Abnormal; Notable for the following components:   D-Dimer, Quant 1.23 (*)    All other components within normal limits  BLOOD GAS, VENOUS - Abnormal; Notable for the following components:   pCO2, Ven 61 (*)    Bicarbonate 32.9 (*)    Acid-Base Excess 5.2 (*)    All other components within normal limits  TROPONIN I (HIGH SENSITIVITY) - Abnormal; Notable for the following components:   Troponin I (High Sensitivity) 19 (*)    All other components within normal limits  BRAIN NATRIURETIC PEPTIDE  TROPONIN I (  HIGH SENSITIVITY)     EKG  ED ECG REPORT I, Willy Eddy, the attending physician, personally viewed and interpreted this ECG.   Date: 06/29/2023  EKG Time: 19:15  Rate: 100  Rhythm: sinus  Axis: right  Intervals: normal  ST&T Change: no stemi,     RADIOLOGY Please see ED Course for my review and interpretation.  I personally reviewed all radiographic images ordered to evaluate for the above acute complaints and reviewed radiology reports and findings.  These findings were personally discussed with the patient.  Please see medical record for radiology report.    PROCEDURES:  Critical Care performed: Yes, see critical care procedure note(s)  Procedures   MEDICATIONS ORDERED IN ED: Medications  iohexol (OMNIPAQUE) 350 MG/ML injection 100 mL (has no administration in time range)  ipratropium-albuterol (DUONEB) 0.5-2.5 (3) MG/3ML nebulizer solution 3 mL (3 mLs Nebulization Given 06/29/23 2027)   ipratropium-albuterol (DUONEB) 0.5-2.5 (3) MG/3ML nebulizer solution 3 mL (3 mLs Nebulization Given 06/29/23 2042)  methylPREDNISolone sodium succinate (SOLU-MEDROL) 125 mg/2 mL injection 125 mg (125 mg Intravenous Given 06/29/23 2100)  midazolam (VERSED) injection 1 mg (1 mg Intravenous Given 06/29/23 2105)  haloperidol lactate (HALDOL) injection 5 mg (5 mg Intramuscular Given 06/29/23 2211)     IMPRESSION / MDM / ASSESSMENT AND PLAN / ED COURSE  I reviewed the triage vital signs and the nursing notes.                              Differential diagnosis includes, but is not limited to, Asthma, copd, CHF, pna, ptx, malignancy, Pe, anemia  Patient presenting to the ER for evaluation of symptoms as described above.  Based on symptoms, risk factors and considered above differential, this presenting complaint could reflect a potentially life-threatening illness therefore the patient will be placed on continuous pulse oximetry and telemetry for monitoring.  Laboratory evaluation will be sent to evaluate for the above complaints.      Clinical Course as of 06/29/23 2335  Mon Jun 29, 2023  2044 Chest x-ray on my review and interpretation without evidence of effusion. [PR]  2125 Mild hypercapnia but well compensated.  D-dimer is elevated.  I have ordered CTA given the patient's increased work of breathing and treating for COPD exacerbation while awaiting those results.  She does appear more comfortable on BiPAP however I do suspect there is a component of anxiety related to this. [PR]  2154 Patient feels somewhat improved on BiPAP requesting something else for anxiety.  She does however have a new O2 requirement she is still tachypneic.  Will order some IM Haldol.  Family was requesting transfer to [facility] explained that I am happy to discuss transfer to [facility] but my initial plan was to admit her to the hospital here in the transfer could potentially delay her admission.  The patient is agreeable to  admission here at this facility. [PR]  2322 Patient will be signed out [facility] physician pending follow-up CT a. [PR]    Clinical Course User Index [PR] Willy Eddy, MD     FINAL CLINICAL IMPRESSION(S) / ED DIAGNOSES   Final diagnoses:  Acute respiratory failure with hypoxia (HCC)     Rx / DC Orders   ED Discharge Orders     None        Note:  This document was prepared using Dragon voice recognition software and may include unintentional dictation errors.    Willy Eddy, MD  06/29/23 2335  

## 2023-06-30 ENCOUNTER — Encounter: Payer: Self-pay | Admitting: Family Medicine

## 2023-06-30 DIAGNOSIS — Z79899 Other long term (current) drug therapy: Secondary | ICD-10-CM | POA: Diagnosis not present

## 2023-06-30 DIAGNOSIS — F319 Bipolar disorder, unspecified: Secondary | ICD-10-CM | POA: Diagnosis present

## 2023-06-30 DIAGNOSIS — J9601 Acute respiratory failure with hypoxia: Secondary | ICD-10-CM | POA: Diagnosis present

## 2023-06-30 DIAGNOSIS — Z8701 Personal history of pneumonia (recurrent): Secondary | ICD-10-CM | POA: Diagnosis not present

## 2023-06-30 DIAGNOSIS — Z91048 Other nonmedicinal substance allergy status: Secondary | ICD-10-CM | POA: Diagnosis not present

## 2023-06-30 DIAGNOSIS — E785 Hyperlipidemia, unspecified: Secondary | ICD-10-CM

## 2023-06-30 DIAGNOSIS — J441 Chronic obstructive pulmonary disease with (acute) exacerbation: Secondary | ICD-10-CM | POA: Diagnosis present

## 2023-06-30 DIAGNOSIS — Z87891 Personal history of nicotine dependence: Secondary | ICD-10-CM | POA: Diagnosis not present

## 2023-06-30 DIAGNOSIS — K219 Gastro-esophageal reflux disease without esophagitis: Secondary | ICD-10-CM | POA: Diagnosis present

## 2023-06-30 DIAGNOSIS — I10 Essential (primary) hypertension: Secondary | ICD-10-CM | POA: Diagnosis present

## 2023-06-30 DIAGNOSIS — Z66 Do not resuscitate: Secondary | ICD-10-CM | POA: Diagnosis present

## 2023-06-30 DIAGNOSIS — Z6830 Body mass index (BMI) 30.0-30.9, adult: Secondary | ICD-10-CM | POA: Diagnosis not present

## 2023-06-30 DIAGNOSIS — Z7951 Long term (current) use of inhaled steroids: Secondary | ICD-10-CM | POA: Diagnosis not present

## 2023-06-30 DIAGNOSIS — E669 Obesity, unspecified: Secondary | ICD-10-CM | POA: Diagnosis present

## 2023-06-30 DIAGNOSIS — E876 Hypokalemia: Secondary | ICD-10-CM | POA: Diagnosis present

## 2023-06-30 DIAGNOSIS — G4733 Obstructive sleep apnea (adult) (pediatric): Secondary | ICD-10-CM | POA: Diagnosis present

## 2023-06-30 DIAGNOSIS — J9622 Acute and chronic respiratory failure with hypercapnia: Secondary | ICD-10-CM | POA: Diagnosis present

## 2023-06-30 DIAGNOSIS — F419 Anxiety disorder, unspecified: Secondary | ICD-10-CM | POA: Diagnosis present

## 2023-06-30 DIAGNOSIS — J9621 Acute and chronic respiratory failure with hypoxia: Secondary | ICD-10-CM | POA: Diagnosis present

## 2023-06-30 DIAGNOSIS — J9602 Acute respiratory failure with hypercapnia: Secondary | ICD-10-CM

## 2023-06-30 DIAGNOSIS — Z888 Allergy status to other drugs, medicaments and biological substances status: Secondary | ICD-10-CM | POA: Diagnosis not present

## 2023-06-30 DIAGNOSIS — D75839 Thrombocytosis, unspecified: Secondary | ICD-10-CM | POA: Diagnosis present

## 2023-06-30 LAB — BASIC METABOLIC PANEL
Anion gap: 9 (ref 5–15)
BUN: 13 mg/dL (ref 6–20)
CO2: 27 mmol/L (ref 22–32)
Calcium: 9.2 mg/dL (ref 8.9–10.3)
Chloride: 101 mmol/L (ref 98–111)
Creatinine, Ser: 0.88 mg/dL (ref 0.44–1.00)
GFR, Estimated: >60 mL/min (ref >60)
Glucose, Bld: 181 mg/dL — ABNORMAL HIGH (ref 70–99)
Potassium: 4.3 mmol/L (ref 3.5–5.1)
Sodium: 137 mmol/L (ref 135–145)

## 2023-06-30 LAB — CBC
HCT: 36.2 % (ref 36.0–46.0)
Hemoglobin: 11.1 g/dL — ABNORMAL LOW (ref 12.0–15.0)
MCH: 23.8 pg — ABNORMAL LOW (ref 26.0–34.0)
MCHC: 30.7 g/dL (ref 30.0–36.0)
MCV: 77.5 fL — ABNORMAL LOW (ref 80.0–100.0)
Platelets: 375 10*3/uL (ref 150–400)
RBC: 4.67 MIL/uL (ref 3.87–5.11)
RDW: 17.9 % — ABNORMAL HIGH (ref 11.5–15.5)
WBC: 17.5 10*3/uL — ABNORMAL HIGH (ref 4.0–10.5)
nRBC: 0 % (ref 0.0–0.2)

## 2023-06-30 MED ORDER — IPRATROPIUM-ALBUTEROL 0.5-2.5 (3) MG/3ML IN SOLN
3.0000 mL | Freq: Four times a day (QID) | RESPIRATORY_TRACT | Status: DC
  Administered 2023-06-30 – 2023-07-02 (×7): 3 mL via RESPIRATORY_TRACT
  Filled 2023-06-30 (×8): qty 3

## 2023-06-30 MED ORDER — DOXEPIN HCL 50 MG PO CAPS
50.0000 mg | ORAL_CAPSULE | Freq: Every day | ORAL | Status: DC
  Administered 2023-06-30 – 2023-07-01 (×2): 50 mg via ORAL
  Filled 2023-06-30 (×2): qty 1

## 2023-06-30 MED ORDER — METHYLPREDNISOLONE SODIUM SUCC 40 MG IJ SOLR
40.0000 mg | Freq: Two times a day (BID) | INTRAMUSCULAR | Status: AC
  Administered 2023-06-30 (×2): 40 mg via INTRAVENOUS
  Filled 2023-06-30 (×2): qty 1

## 2023-06-30 MED ORDER — BUSPIRONE HCL 10 MG PO TABS
20.0000 mg | ORAL_TABLET | Freq: Two times a day (BID) | ORAL | Status: DC
  Administered 2023-06-30 – 2023-07-02 (×5): 20 mg via ORAL
  Filled 2023-06-30 (×2): qty 2
  Filled 2023-06-30: qty 4
  Filled 2023-06-30 (×2): qty 2

## 2023-06-30 MED ORDER — ONDANSETRON HCL 4 MG PO TABS: 4.0000 mg | ORAL_TABLET | Freq: Four times a day (QID) | ORAL | Status: DC | PRN

## 2023-06-30 MED ORDER — TRAZODONE HCL 50 MG PO TABS: 25.0000 mg | ORAL_TABLET | Freq: Every evening | ORAL | Status: DC | PRN

## 2023-06-30 MED ORDER — BENZTROPINE MESYLATE 0.5 MG PO TABS
1.0000 mg | ORAL_TABLET | Freq: Every morning | ORAL | Status: DC
  Administered 2023-06-30 – 2023-07-02 (×3): 1 mg via ORAL
  Filled 2023-06-30: qty 2
  Filled 2023-06-30: qty 1
  Filled 2023-06-30: qty 2

## 2023-06-30 MED ORDER — SODIUM CHLORIDE 0.9 % IV SOLN
1.0000 g | INTRAVENOUS | Status: DC
  Administered 2023-06-30: 1 g via INTRAVENOUS
  Filled 2023-06-30: qty 10

## 2023-06-30 MED ORDER — CARIPRAZINE HCL 1.5 MG PO CAPS
6.0000 mg | ORAL_CAPSULE | Freq: Every day | ORAL | Status: DC
  Administered 2023-06-30 – 2023-07-02 (×3): 6 mg via ORAL
  Filled 2023-06-30 (×3): qty 4

## 2023-06-30 MED ORDER — HYDROCOD POLI-CHLORPHE POLI ER 10-8 MG/5ML PO SUER: 5.0000 mL | Freq: Two times a day (BID) | ORAL | Status: DC | PRN

## 2023-06-30 MED ORDER — AZITHROMYCIN 500 MG PO TABS
500.0000 mg | ORAL_TABLET | Freq: Every day | ORAL | Status: AC
  Administered 2023-06-30: 500 mg via ORAL
  Filled 2023-06-30: qty 1

## 2023-06-30 MED ORDER — ONDANSETRON HCL 4 MG/2ML IJ SOLN: 4.0000 mg | Freq: Four times a day (QID) | INTRAMUSCULAR | Status: DC | PRN

## 2023-06-30 MED ORDER — CARVEDILOL 6.25 MG PO TABS
3.1250 mg | ORAL_TABLET | Freq: Two times a day (BID) | ORAL | Status: DC
  Administered 2023-06-30 – 2023-07-02 (×4): 3.125 mg via ORAL
  Filled 2023-06-30 (×4): qty 1

## 2023-06-30 MED ORDER — LIDOCAINE 5 % EX PTCH
1.0000 | MEDICATED_PATCH | Freq: Two times a day (BID) | CUTANEOUS | Status: DC
  Filled 2023-06-30 (×4): qty 1

## 2023-06-30 MED ORDER — GUAIFENESIN ER 600 MG PO TB12
600.0000 mg | ORAL_TABLET | Freq: Two times a day (BID) | ORAL | Status: DC
  Administered 2023-06-30 – 2023-07-02 (×6): 600 mg via ORAL
  Filled 2023-06-30 (×6): qty 1

## 2023-06-30 MED ORDER — MAGNESIUM HYDROXIDE 400 MG/5ML PO SUSP: 30.0000 mL | Freq: Every day | ORAL | Status: DC | PRN

## 2023-06-30 MED ORDER — AZITHROMYCIN 250 MG PO TABS
250.0000 mg | ORAL_TABLET | Freq: Every day | ORAL | Status: DC
  Administered 2023-07-01 – 2023-07-02 (×2): 250 mg via ORAL
  Filled 2023-06-30 (×2): qty 1

## 2023-06-30 MED ORDER — ACETAMINOPHEN 325 MG PO TABS: 650.0000 mg | ORAL_TABLET | Freq: Four times a day (QID) | ORAL | Status: DC | PRN

## 2023-06-30 MED ORDER — ENOXAPARIN SODIUM 60 MG/0.6ML IJ SOSY
0.5000 mg/kg | PREFILLED_SYRINGE | INTRAMUSCULAR | Status: DC
  Administered 2023-06-30 – 2023-07-02 (×3): 52.5 mg via SUBCUTANEOUS
  Filled 2023-06-30 (×3): qty 0.6

## 2023-06-30 MED ORDER — PREDNISONE 20 MG PO TABS
40.0000 mg | ORAL_TABLET | Freq: Every day | ORAL | Status: DC
  Administered 2023-07-01 – 2023-07-02 (×2): 40 mg via ORAL
  Filled 2023-06-30 (×2): qty 2

## 2023-06-30 MED ORDER — ATORVASTATIN CALCIUM 20 MG PO TABS
40.0000 mg | ORAL_TABLET | Freq: Every day | ORAL | Status: DC
  Administered 2023-06-30 – 2023-07-02 (×3): 40 mg via ORAL
  Filled 2023-06-30 (×3): qty 2

## 2023-06-30 MED ORDER — IPRATROPIUM-ALBUTEROL 0.5-2.5 (3) MG/3ML IN SOLN
3.0000 mL | Freq: Four times a day (QID) | RESPIRATORY_TRACT | Status: DC
  Administered 2023-06-30 (×3): 3 mL via RESPIRATORY_TRACT
  Filled 2023-06-30 (×3): qty 3

## 2023-06-30 MED ORDER — SODIUM CHLORIDE 0.9 % IV SOLN: INTRAVENOUS | Status: AC

## 2023-06-30 MED ORDER — ACETAMINOPHEN 650 MG RE SUPP: 650.0000 mg | Freq: Four times a day (QID) | RECTAL | Status: DC | PRN

## 2023-06-30 MED ORDER — AMLODIPINE BESYLATE 10 MG PO TABS
10.0000 mg | ORAL_TABLET | Freq: Every day | ORAL | Status: DC
  Administered 2023-06-30 – 2023-07-02 (×2): 10 mg via ORAL
  Filled 2023-06-30: qty 1
  Filled 2023-06-30: qty 2

## 2023-06-30 NOTE — ED Notes (Signed)
Pt transported to CT accompanied by RN and RT with transport monitor.

## 2023-06-30 NOTE — ED Notes (Signed)
Patients breakfast tray delivered 

## 2023-06-30 NOTE — ED Notes (Signed)
This RN assisted patient to bedside commode.

## 2023-06-30 NOTE — ED Notes (Signed)
Patient's lunch tray delivered.

## 2023-06-30 NOTE — ED Notes (Signed)
Admitting provider at patient bedside.

## 2023-06-30 NOTE — Progress Notes (Signed)
Anticoagulation monitoring(Lovenox):  57 yo female ordered Lovenox 40 mg Q24h    There were no vitals filed for this visit. BMI    Lab Results  Component Value Date   CREATININE 0.99 06/29/2023   CREATININE 0.86 06/22/2023   CREATININE 0.82 06/21/2023   Estimated Creatinine Clearance: 75.2 mL/min (by C-G formula based on SCr of 0.99 mg/dL). Hemoglobin & Hematocrit     Component Value Date/Time   HGB 11.7 (L) 06/29/2023 1908   HCT 38.3 06/29/2023 1908     Per Protocol for Patient with estCrcl > 30 ml/min and BMI > 30, will transition to Lovenox 52.5 mg Q24h.

## 2023-06-30 NOTE — ED Notes (Signed)
This RN attempted to wean patient off Bipap, placed on 3L O2 via nasal canula. Patient in agreement.

## 2023-06-30 NOTE — TOC Initial Note (Addendum)
Transition of Care [facility]) - Initial/Assessment Note    Patient Details  Name: Courtney Carrillo MRN: [id number] Date of Birth: 12-02-1965  [facility]) CM/SW Contact:    Marquita Palms, LCSW Phone Number: 06/30/2023, 10:22 AM  Clinical Narrative:                  Health risk assessment complete. CSW met with patient bedside.patient reports using [facility] in [place]. Patient has transportation home. Patient reports never using HH at home. Patient's fall and fracture rib and says she has been having problems every since. Patient reports she will be admitted.       Patient Goals and CMS Choice            Expected Discharge Plan and Services                                              Prior Living Arrangements/Services                       Activities of Daily Living   ADL Screening (condition at time of admission) Independently performs ADLs?: Yes (appropriate for developmental age) Is the patient deaf or have difficulty hearing?: No Does the patient have difficulty seeing, even when wearing glasses/contacts?: No Does the patient have difficulty concentrating, remembering, or making decisions?: No  Permission Sought/Granted                  Emotional Assessment              Admission diagnosis:  Acute respiratory failure with hypoxia and hypercapnia (HCC) [J96.01, J96.02] Patient Active Problem List   Diagnosis Date Noted   Acute respiratory failure with hypoxia and hypercapnia (HCC) 06/30/2023   COPD with acute exacerbation (HCC) 06/30/2023   Essential hypertension 06/30/2023   Dyslipidemia 06/30/2023   Need for management of chest tube 06/21/2023   Recurrent pleural effusion 06/21/2023   Pleural effusion exudative 06/21/2023   History of tobacco abuse 06/21/2023   Acute respiratory failure with hypoxia (HCC) 06/21/2023   Recurrent pleural effusion on left 06/21/2023   Loculated pleural effusion 06/20/2023   Sepsis  (HCC) 06/20/2023   Troponin level elevated 06/20/2023   Hypertensive urgency 06/20/2023   Microcytic anemia 06/20/2023   Thrombocytosis 06/20/2023   Obesity (BMI 30-39.9) 06/20/2023   Bronchitis 06/02/2023   Hypoxia 06/02/2023   Acute hypoxic respiratory failure (HCC) 05/09/2023   Anemia of chronic disease 05/09/2023   Hypokalemia 05/09/2023   Recurrent left pleural effusion 05/08/2023   Prediabetes 01/28/2023   Obesity, Class III, BMI 40-49.9 (morbid obesity) (HCC) 12/16/2021   OSA (obstructive sleep apnea) 12/13/2020   Chronic bilateral back pain 08/16/2020   Hyperlipidemia 08/16/2020   Anxiety and depression 08/16/2020   Primary hypertension 08/16/2020   PCP:  Lorre Munroe, NP Pharmacy:   [facility] [phone] [place] [address], [place] - [address] AT [facility] OF [address] [place] [address] [place] [address] Phone: [phone] Fax: [phone]     Social Determinants of Health (SDOH) Social History: SDOH Screenings   Food Insecurity: No Food Insecurity (06/30/2023)  Housing: Patient Declined (06/30/2023)  Transportation Needs: No Transportation Needs (06/30/2023)  Utilities: Not At Risk (06/30/2023)  Alcohol Screen: Low Risk  (01/28/2023)  Depression (PHQ2-9): Low Risk  (06/17/2023)  Financial  Resource Strain: Low Risk  (12/25/2022)  Physical Activity: Inactive (06/11/2023)  Social Connections: Socially Isolated (06/11/2023)  Stress: No Stress Concern Present (12/25/2022)  Tobacco Use: Medium Risk (06/30/2023)  Health Literacy: Adequate Health Literacy (06/11/2023)   SDOH Interventions:     Readmission Risk Interventions    06/30/2023   10:20 AM 06/23/2023   11:10 AM  Readmission Risk Prevention Plan  Transportation Screening Complete Complete  PCP or Specialist Appt within 3-5 Days  Complete  HRI or Home Care Consult  Complete  Social Work Consult for Recovery Care Planning/Counseling  Complete  Palliative Care Screening  Not Applicable   Medication Review Oceanographer) Referral to Pharmacy Referral to Pharmacy  PCP or Specialist appointment within 3-5 days of discharge Complete   HRI or Home Care Consult Complete   SW Recovery Care/Counseling Consult Complete   Palliative Care Screening Not Applicable   Skilled Nursing Facility Not Applicable

## 2023-06-30 NOTE — Assessment & Plan Note (Signed)
-   The patient will be admitted to a progressive unit bed - We will place the patient IV steroid therapy with IV Solu-Medrol as well as nebulized bronchodilator therapy with duonebs q.i.d. and q.4 hours p.r.n.Marland Kitchen - Mucolytic therapy will be provided with Mucinex and antibiotic therapy with IV Rocephin. - O2 protocol will be followed. - We will continue BiPAP.

## 2023-06-30 NOTE — Progress Notes (Addendum)
Brief rounding note, same day as admission  HPI: Pt admitted after midnight with acute on chronic respiratory failure with hypoxia due to COPD with acute exacerbation.  See H&P for full HPI on admission & ED course.   Interval history: Pt seen in ED holding for a bed this AM.  She reports her shortness of breath has improved today.  States on 3 L/min oxygen at baseline.  No fever/chills, CP, N/V or other acute complaints.  Exam: General exam: awake, alert, no acute distress HEENT: atraumatic, clear conjunctiva, anicteric sclera, moist mucus membranes, hearing grossly normal  Respiratory system: severely diminished breath sounds throughout, no wheezes or rhonchi, normal respiratory effort at rest on 3 L/min [place] O2. Cardiovascular system: normal S1/S2, RRR,  no pedal edema.   Gastrointestinal system: soft, NT, ND, no HSM felt, +bowel sounds. Central nervous system: A&O x3. no gross focal neurologic deficits, normal speech Extremities: moves all, no edema, normal tone Skin: dry, intact, normal temperature Psychiatry: normal mood, congruent affect, judgement and insight appear normal   A&P: as per H&P by Dr. Arville Care, with any changes or additions as below:  --change admission order to med/telemetry, pt does not need progressive --d/c Rocephin - no evidence of pneumonia --Zithromax for COPD/bronchitis and anti-inflammatory properties --Agree with steroids as ordered, continue IV for today    No charge

## 2023-06-30 NOTE — H&P (Addendum)
[place]   PATIENT NAME: Courtney Carrillo    MR#:  [id number]  DATE OF BIRTH:  1966-07-30  DATE OF ADMISSION:  06/29/2023  PRIMARY CARE PHYSICIAN: Lorre Munroe, NP   Patient is coming from: Home  REQUESTING/REFERRING PHYSICIAN: Delton Prairie, MD  CHIEF COMPLAINT:   Chief Complaint  Patient presents with   Shortness of Breath    HISTORY OF PRESENT ILLNESS:  Courtney Carrillo is a 57 y.o. female with medical history significant for hypertension, dyslipidemia, OSA on CPAP, anxiety and depression, who presented to the emergency room with acute onset of worsening dyspnea with associated wheezing for the last couple of days with no significant cough.  She denies any fever or chills.  No chest pain or palpitations.  No nausea or vomiting or abdominal pain.  No bleeding diathesis.  No dysuria, oliguria or hematuria or flank pain.  ED Course: Upon presentation to the emergency room heart rate was 116 and respiratory rate 28 with BP of 148/70 and pulse symmetry was 100% on BiPAP.  Labs revealed blood gas with pH 7.34 and HCO3 of 32.9 with pCO2 61 and pO2 of 34 and O2 sat of 49.5% on room air.  CMP was remarkable for hypokalemia of 2.9 and high sensitive troponin I was 12 and later 19.  CBC showed leukocytosis of 18.8 with thrombocytosis.  D-dimer was 1.3. EKG as reviewed by me : EKG showed normal sinus rhythm with rate of 100 with right axis deviation and poor R wave progression. Imaging: Two-view chest x-ray showed blunting of left costophrenic angle with no definite pleural effusion.  Chest CTA revealed no evidence for PE.  It showed mild scarring in the lingula and healing of left rib fractures. PAST MEDICAL HISTORY:   Past Medical History:  Diagnosis Date   Anemia    2015   Anxiety    Depression    Headache    H/O   Hyperlipidemia    Hypertension    Sleep apnea    CPAP    PAST SURGICAL HISTORY:   Past Surgical History:  Procedure Laterality Date   BACK SURGERY      LUMBAR   CARPAL TUNNEL RELEASE Right 06/19/2015   Procedure: CARPAL TUNNEL RELEASE;  Surgeon: Kennedy Bucker, MD;  Location: [facility] ORS;  Service: Orthopedics;  Laterality: Right;   COLONOSCOPY WITH PROPOFOL N/A 01/28/2021   Procedure: COLONOSCOPY WITH PROPOFOL;  Surgeon: Toney Reil, MD;  Location: [facility] ENDOSCOPY;  Service: Gastroenterology;  Laterality: N/A;   DILATION AND CURETTAGE OF UTERUS     FLEXIBLE SIGMOIDOSCOPY N/A 04/10/2021   Procedure: FLEXIBLE SIGMOIDOSCOPY;  Surgeon: Toney Reil, MD;  Location: [facility] ENDOSCOPY;  Service: Gastroenterology;  Laterality: N/A;    SOCIAL HISTORY:   Social History   Tobacco Use   Smoking status: Former    Current packs/day: 0.50    Average packs/day: 0.5 packs/day for 35.2 years (17.6 ttl pk-yrs)    Types: Cigarettes    Start date: 09/12/2021   Smokeless tobacco: Never  Substance Use Topics   Alcohol use: No    FAMILY HISTORY:   Family History  Problem Relation Age of Onset   Diabetes Mother    Breast cancer Sister     DRUG ALLERGIES:   Allergies  Allergen Reactions   Lisinopril Cough   Losartan Cough   Other Other (See Comments)    Perfumes : Congestion    REVIEW OF SYSTEMS:   ROS As per history of  present illness. All pertinent systems were reviewed above. Constitutional, HEENT, cardiovascular, respiratory, GI, GU, musculoskeletal, neuro, psychiatric, endocrine, integumentary and hematologic systems were reviewed and are otherwise negative/unremarkable except for positive findings mentioned above in the HPI.   MEDICATIONS AT HOME:   Prior to Admission medications   Medication Sig Start Date End Date Taking? Authorizing Provider  acetaminophen-codeine (TYLENOL #3) 300-30 MG tablet Take 1 tablet by mouth every 8 (eight) hours as needed for moderate pain (pain score 4-6). 05/28/23   Lorre Munroe, NP  albuterol (PROVENTIL) (2.5 MG/3ML) 0.083% nebulizer solution Take 3 mLs (2.5 mg total) by nebulization every 4  (four) hours as needed for wheezing or shortness of breath. 06/19/23 06/18/24  Tommi Rumps, PA-C  amLODipine (NORVASC) 10 MG tablet Take 1 tablet (10 mg total) by mouth daily. 06/05/23 06/04/24  Lucile Shutters, MD  amoxicillin-clavulanate (AUGMENTIN) 875-125 MG tablet Take 1 tablet by mouth every 12 (twelve) hours for 12 days. 06/23/23 07/05/23  Willeen Niece, MD  atorvastatin (LIPITOR) 40 MG tablet TAKE 1 TABLET(40 MG) BY MOUTH EVERY MORNING Patient taking differently: Take 40 mg by mouth daily. 05/12/23   Lorre Munroe, NP  benztropine (COGENTIN) 1 MG tablet Take 1 mg by mouth every morning. 12/04/20   [provider]  budesonide-formoterol (SYMBICORT) 160-4.5 MCG/ACT inhaler Inhale 2 puffs into the lungs 2 (two) times daily. 06/05/23 07/05/23  Agbata, Elwyn Lade, MD  busPIRone (BUSPAR) 10 MG tablet Take 20 mg by mouth 2 (two) times daily. 07/23/20   [provider]  carvedilol (COREG) 3.125 MG tablet Take 1 tablet (3.125 mg total) by mouth 2 (two) times daily with a meal. 06/05/23 07/05/23  Agbata, Tochukwu, MD  doxepin (SINEQUAN) 50 MG capsule Take 50 mg by mouth at bedtime. 12/04/20   [provider]  lidocaine (LIDODERM) 5 % Place 1 patch onto the skin every 12 (twelve) hours. Remove & Discard patch within 12 hours or as directed by MD 06/24/23 06/23/24  Lorre Munroe, NP  VRAYLAR 6 MG CAPS Take 1 capsule by mouth daily. 07/23/20   [provider]      VITAL SIGNS:  Blood pressure (!) 152/98, pulse 87, temperature 97.9 F (36.6 C), temperature source Axillary, resp. rate 18, SpO2 100%.  PHYSICAL EXAMINATION:  Physical Exam  GENERAL: Acutely ill 57 y.o.-year-old African-American female patient lying in the bed with mild respiratory distress with conversational dyspnea. EYES: Pupils equal, round, reactive to light and accommodation. No scleral icterus. Extraocular muscles intact.  HEENT: Head atraumatic, normocephalic. Oropharynx and nasopharynx  clear.  NECK:  Supple, no jugular venous distention. No thyroid enlargement, no tenderness.  LUNGS: Diffuse expiratory wheezes with diminished expiratory airflow and harsh vesicular breathing.. No use of accessory muscles of respiration.  CARDIOVASCULAR: Regular rate and rhythm, S1, S2 normal. No murmurs, rubs, or gallops.  ABDOMEN: Soft, nondistended, nontender. Bowel sounds present. No organomegaly or mass.  EXTREMITIES: No pedal edema, cyanosis, or clubbing.  NEUROLOGIC: Cranial nerves II through XII are intact. Muscle strength 5/5 in all extremities. Sensation intact. Gait not checked.  PSYCHIATRIC: The patient is alert and oriented x 3.  Normal affect and good eye contact. SKIN: No obvious rash, lesion, or ulcer.   LABORATORY PANEL:   CBC Recent Labs  Lab 06/30/23 0539  WBC 17.5*  HGB 11.1*  HCT 36.2  PLT 375   ------------------------------------------------------------------------------------------------------------------  Chemistries  Recent Labs  Lab 06/29/23 1908  NA 140  K 2.9*  CL 102  CO2 29  GLUCOSE  118*  BUN 14  CREATININE 0.99  CALCIUM 9.3  AST 17  ALT 20  ALKPHOS 66  BILITOT 0.5   ------------------------------------------------------------------------------------------------------------------  Cardiac Enzymes No results for input(s): "TROPONINI" in the last 168 hours. ------------------------------------------------------------------------------------------------------------------  RADIOLOGY:  CT Angio Chest PE W and/or Wo Contrast  Result Date: 06/30/2023 CLINICAL DATA:  Difficulty breathing EXAM: CT ANGIOGRAPHY CHEST WITH CONTRAST TECHNIQUE: Multidetector CT imaging of the chest was performed using the standard protocol during bolus administration of intravenous contrast. Multiplanar CT image reconstructions and MIPs were obtained to evaluate the vascular anatomy. RADIATION DOSE REDUCTION: This exam was performed according to the departmental  dose-optimization program which includes automated exposure control, adjustment of the mA and/or kV according to patient size and/or use of iterative reconstruction technique. CONTRAST:  75mL OMNIPAQUE IOHEXOL 350 MG/ML SOLN COMPARISON:  Plain film from earlier in the same day, 06/19/2023. FINDINGS: Cardiovascular: Thoracic aorta shows normal enhancement pattern. No aneurysmal dilatation or dissection is noted. No cardiac enlargement is seen. The pulmonary artery shows a normal branching pattern bilaterally. No filling defect to suggest pulmonary embolism is seen. No coronary calcifications are noted. Mediastinum/Nodes: Thoracic inlet is within normal limits. No hilar or mediastinal adenopathy is noted. The esophagus is within normal limits. Lungs/Pleura: Lungs are well aerated bilaterally. No focal infiltrate is seen. Some scarring is noted in the lingula. Upper Abdomen: Visualized upper abdomen shows no acute abnormality. Musculoskeletal: Multiple healing rib fractures are noted on the left. Review of the MIP images confirms the above findings. IMPRESSION: No evidence of pulmonary emboli. Mild scarring in the lingula. Healing left rib fractures. Electronically Signed   By: Alcide Clever M.D.   On: 06/30/2023 01:42   DG Chest 2 View  Result Date: 06/29/2023 CLINICAL DATA:  SOB, increased O2 needs EXAM: CHEST - 2 VIEW COMPARISON:  CT chest 06/19/2023, chest x-ray 06/22/2023 FINDINGS: The heart and mediastinal contours are within normal limits. No focal consolidation. No pulmonary edema. Blunting of the left costophrenic angle with no definite pleural effusion on lateral view. No right pleural effusion. No pneumothorax. No acute osseous abnormality. IMPRESSION: Blunting of the left costophrenic angle with no definite pleural effusion on lateral view. Query scarring. Possible underlying atelectasis Electronically Signed   By: Tish Frederickson M.D.   On: 06/29/2023 21:59      IMPRESSION AND PLAN:  Assessment  and Plan: * Acute respiratory failure with hypoxia and hypercapnia (HCC) - The patient will be admitted to a progressive unit bed - We will place the patient IV steroid therapy with IV Solu-Medrol as well as nebulized bronchodilator therapy with duonebs q.i.d. and q.4 hours p.r.n.Marland Kitchen - Mucolytic therapy will be provided with Mucinex and antibiotic therapy with IV Rocephin. - O2 protocol will be followed. - We will continue BiPAP.   COPD with acute exacerbation (HCC) - This is clearly the culprit for #1. - Management as above.  Dyslipidemia - Will continue statin therapy.  Essential hypertension - We will continue antihypertensive therapy.       DVT prophylaxis: Lovenox. Advanced Care Planning:  Code Status: The patient is DNR only. Family Communication:  The plan of care was discussed in details with the patient (and family). I answered all questions. The patient agreed to proceed with the above mentioned plan. Further management will depend upon hospital course. Disposition Plan: Back to previous home environment Consults called: none. All the records are reviewed and case discussed with ED provider.  Status is: Inpatient   At the time of the  admission, it appears that the appropriate admission status for this patient is inpatient.  This is judged to be reasonable and necessary in order to provide the required intensity of service to ensure the patient's safety given the presenting symptoms, physical exam findings and initial radiographic and laboratory data in the context of comorbid conditions.  The patient requires inpatient status due to high intensity of service, high risk of further deterioration and high frequency of surveillance required.  I certify that at the time of admission, it is my clinical judgment that the patient will require inpatient hospital care extending more than 2 midnights.                            Dispo: The patient is from: Home               Anticipated d/c is to: Home              Patient currently is not medically stable to d/c.              Difficult to place patient: No Authorized and performed by: Valente David, MD Total critical care time:   50     minutes. Due to a high probability of clinically significant, life-threatening deterioration, the patient required my highest level of preparedness to intervene emergently and I personally spent this critical care time directly and personally managing the patient.  This critical care time included obtaining a history, examining the patient, pulse oximetry, ordering and review of studies, arranging urgent treatment with development of management plan, evaluation of patient's response to treatment, frequent reassessment, and discussions with other providers. This critical care time was performed to assess and manage the high probability of imminent, life-threatening deterioration that could result in multiorgan failure.  It was exclusive of separately billable procedures and treating other patients and teaching time.   Hannah Beat M.D on 06/30/2023 at 6:26 AM  [facility]   From 7 PM-7 AM, contact night-coverage [email or web address]  CC: Primary care physician; Lorre Munroe, NP

## 2023-06-30 NOTE — Assessment & Plan Note (Signed)
Will continue statin therapy. ?

## 2023-06-30 NOTE — ED Notes (Signed)
ED TO INPATIENT HANDOFF REPORT  ED Nurse Name and Phone #: Osborne Casco, RN  S Name/Age/Gender Courtney Carrillo 57 y.o. female Room/Bed: ED11A/ED11A  Code Status   Code Status: Full Code  Home/SNF/Other Home Patient oriented to: self, place, time, and situation Is this baseline? Yes   Triage Complete: Triage complete  Chief Complaint Acute respiratory failure with hypoxia and hypercapnia (HCC) [J96.01, J96.02] Acute on chronic respiratory failure with hypoxia and hypercapnia (HCC) [J96.21, J96.22]  Triage Note Pt presnts to ED for c/o SOB and feeling like she can't catch her breath. Reports recently discharged from [facility] for pneumonia and sent home with oxygen, wears 2L normally.  85% on RA at time of triage.    Allergies Allergies  Allergen Reactions   Lisinopril Cough   Losartan Cough   Other Other (See Comments)    Perfumes : Congestion    Level of Care/Admitting Diagnosis ED Disposition     ED Disposition  Admit   Condition  --   Comment  Hospital Area: [facility] [[id number]]  Level of Care: Telemetry Medical [[id number]]  Covid Evaluation: Confirmed COVID Negative  Diagnosis: Acute on chronic respiratory failure with hypoxia and hypercapnia [facility]) [[id number]]  Admitting Physician: Pennie Banter [[id number]]  Attending Physician: Pennie Banter [[id number]]  Certification:: I certify this patient will need inpatient services for at least 2 midnights          B Medical/Surgery History Past Medical History:  Diagnosis Date   Anemia    2015   Anxiety    Depression    Headache    H/O   Hyperlipidemia    Hypertension    Sleep apnea    CPAP   Past Surgical History:  Procedure Laterality Date   BACK SURGERY     LUMBAR   CARPAL TUNNEL RELEASE Right 06/19/2015   Procedure: CARPAL TUNNEL RELEASE;  Surgeon: Kennedy Bucker, MD;  Location: [facility] ORS;  Service: Orthopedics;  Laterality: Right;   COLONOSCOPY WITH PROPOFOL N/A 01/28/2021   Procedure:  COLONOSCOPY WITH PROPOFOL;  Surgeon: Toney Reil, MD;  Location: [facility] ENDOSCOPY;  Service: Gastroenterology;  Laterality: N/A;   DILATION AND CURETTAGE OF UTERUS     FLEXIBLE SIGMOIDOSCOPY N/A 04/10/2021   Procedure: FLEXIBLE SIGMOIDOSCOPY;  Surgeon: Toney Reil, MD;  Location: [facility] ENDOSCOPY;  Service: Gastroenterology;  Laterality: N/A;     A IV Location/Drains/Wounds Patient Lines/Drains/Airways Status     Active Line/Drains/Airways     Name Placement date Placement time Site Days   Peripheral IV 06/29/23 20 G Right Antecubital 06/29/23  1908  Antecubital  1            Intake/Output Last 24 hours  Intake/Output Summary (Last 24 hours) at 06/30/2023 1448 Last data filed at 06/30/2023 1058 Gross per 24 hour  Intake 100 ml  Output 300 ml  Net -200 ml    Labs/Imaging Results for orders placed or performed during the hospital encounter of 06/29/23 (from the past 48 hour(s))  CBC     Status: Abnormal   Collection Time: 06/29/23  7:08 PM  Result Value Ref Range   WBC 18.8 (H) 4.0 - 10.5 K/uL   RBC 4.92 3.87 - 5.11 MIL/uL   Hemoglobin 11.7 (L) 12.0 - 15.0 g/dL   HCT 95.6 21.3 - 08.6 %   MCV 77.8 (L) 80.0 - 100.0 fL   MCH 23.8 (L) 26.0 - 34.0 pg   MCHC 30.5 30.0 - 36.0 g/dL   RDW 57.8 (  H) 11.5 - 15.5 %   Platelets 421 (H) 150 - 400 K/uL   nRBC 0.1 0.0 - 0.2 %    Comment: Performed at [facility], [address] [place], [place] [address]  Comprehensive metabolic panel     Status: Abnormal   Collection Time: 06/29/23  7:08 PM  Result Value Ref Range   Sodium 140 135 - 145 mmol/L   Potassium 2.9 (L) 3.5 - 5.1 mmol/L   Chloride 102 98 - 111 mmol/L   CO2 29 22 - 32 mmol/L   Glucose, Bld 118 (H) 70 - 99 mg/dL    Comment: Glucose reference range applies only to samples taken after fasting for at least 8 hours.   BUN 14 6 - 20 mg/dL   Creatinine, Ser 6.04 0.44 - 1.00 mg/dL   Calcium 9.3 8.9 - 54.0 mg/dL   Total Protein 8.1 6.5 - 8.1 g/dL    Albumin 4.4 3.5 - 5.0 g/dL   AST 17 15 - 41 U/L   ALT 20 0 - 44 U/L   Alkaline Phosphatase 66 38 - 126 U/L   Total Bilirubin 0.5 <1.2 mg/dL   GFR, Estimated >98 >11 mL/min    Comment: (NOTE) Calculated using the CKD-EPI Creatinine Equation (2021)    Anion gap 9 5 - 15    Comment: Performed at [facility], [address] [place], [place] [address]  Troponin I (High Sensitivity)     Status: None   Collection Time: 06/29/23  7:08 PM  Result Value Ref Range   Troponin I (High Sensitivity) 12 <18 ng/L    Comment: (NOTE) Elevated high sensitivity troponin I (hsTnI) values and significant  changes across serial measurements may suggest ACS but many other  chronic and acute conditions are known to elevate hsTnI results.  Refer to the "Links" section for chest pain algorithms and additional  guidance. Performed at [facility], [address] [place], [place] [address]   Brain natriuretic peptide     Status: None   Collection Time: 06/29/23  7:08 PM  Result Value Ref Range   B Natriuretic Peptide 72.1 0.0 - 100.0 pg/mL    Comment: Performed at [facility], [address] [place], [place] [address]  D-dimer, quantitative     Status: Abnormal   Collection Time: 06/29/23  7:09 PM  Result Value Ref Range   D-Dimer, Quant 1.23 (H) 0.00 - 0.50 ug/mL-FEU    Comment: (NOTE) At the manufacturer cut-off value of 0.5 g/mL FEU, this assay has a negative predictive value of 95-100%.This assay is intended for use in conjunction with a clinical pretest probability (PTP) assessment model to exclude pulmonary embolism (PE) and deep venous thrombosis (DVT) in outpatients suspected of PE or DVT. Results should be correlated with clinical presentation. Performed at [facility], [address] [place], [place] [address]   Troponin I (High Sensitivity)     Status: Abnormal   Collection Time: 06/29/23  8:55 PM  Result Value Ref Range   Troponin I (High  Sensitivity) 19 (H) <18 ng/L    Comment: (NOTE) Elevated high sensitivity troponin I (hsTnI) values and significant  changes across serial measurements may suggest ACS but many other  chronic and acute conditions are known to elevate hsTnI results.  Refer to the "Links" section for chest pain algorithms and additional  guidance. Performed at [facility], [address] [place], [place] [address]   Blood gas, venous  Status: Abnormal   Collection Time: 06/29/23  8:55 PM  Result Value Ref Range   pH, Ven 7.34 7.25 - 7.43   pCO2, Ven 61 (H) 44 - 60 mmHg   pO2, Ven 34 32 - 45 mmHg   Bicarbonate 32.9 (H) 20.0 - 28.0 mmol/L   Acid-Base Excess 5.2 (H) 0.0 - 2.0 mmol/L   O2 Saturation 49.5 %   Patient temperature 37.0    Collection site VEIN     Comment: Performed at [facility], [address] [place], [place] [address]  Basic metabolic panel     Status: Abnormal   Collection Time: 06/30/23  5:39 AM  Result Value Ref Range   Sodium 137 135 - 145 mmol/L   Potassium 4.3 3.5 - 5.1 mmol/L   Chloride 101 98 - 111 mmol/L   CO2 27 22 - 32 mmol/L   Glucose, Bld 181 (H) 70 - 99 mg/dL    Comment: Glucose reference range applies only to samples taken after fasting for at least 8 hours.   BUN 13 6 - 20 mg/dL   Creatinine, Ser 1.91 0.44 - 1.00 mg/dL   Calcium 9.2 8.9 - 47.8 mg/dL   GFR, Estimated >29 >56 mL/min    Comment: (NOTE) Calculated using the CKD-EPI Creatinine Equation (2021)    Anion gap 9 5 - 15    Comment: Performed at [facility], [address] [place], [place] [address]  CBC     Status: Abnormal   Collection Time: 06/30/23  5:39 AM  Result Value Ref Range   WBC 17.5 (H) 4.0 - 10.5 K/uL   RBC 4.67 3.87 - 5.11 MIL/uL   Hemoglobin 11.1 (L) 12.0 - 15.0 g/dL   HCT 65.7 84.6 - 96.2 %   MCV 77.5 (L) 80.0 - 100.0 fL   MCH 23.8 (L) 26.0 - 34.0 pg   MCHC 30.7 30.0 - 36.0 g/dL   RDW 95.2 (H) 84.1 - 32.4 %   Platelets 375 150 - 400 K/uL   nRBC  0.0 0.0 - 0.2 %    Comment: Performed at [facility], [address] [place], [place] [address]   CT Angio Chest PE W and/or Wo Contrast  Result Date: 06/30/2023 CLINICAL DATA:  Difficulty breathing EXAM: CT ANGIOGRAPHY CHEST WITH CONTRAST TECHNIQUE: Multidetector CT imaging of the chest was performed using the standard protocol during bolus administration of intravenous contrast. Multiplanar CT image reconstructions and MIPs were obtained to evaluate the vascular anatomy. RADIATION DOSE REDUCTION: This exam was performed according to the departmental dose-optimization program which includes automated exposure control, adjustment of the mA and/or kV according to patient size and/or use of iterative reconstruction technique. CONTRAST:  75mL OMNIPAQUE IOHEXOL 350 MG/ML SOLN COMPARISON:  Plain film from earlier in the same day, 06/19/2023. FINDINGS: Cardiovascular: Thoracic aorta shows normal enhancement pattern. No aneurysmal dilatation or dissection is noted. No cardiac enlargement is seen. The pulmonary artery shows a normal branching pattern bilaterally. No filling defect to suggest pulmonary embolism is seen. No coronary calcifications are noted. Mediastinum/Nodes: Thoracic inlet is within normal limits. No hilar or mediastinal adenopathy is noted. The esophagus is within normal limits. Lungs/Pleura: Lungs are well aerated bilaterally. No focal infiltrate is seen. Some scarring is noted in the lingula. Upper Abdomen: Visualized upper abdomen shows no acute abnormality. Musculoskeletal: Multiple healing rib fractures are noted on the left. Review of the MIP images confirms the above findings. IMPRESSION: No evidence of pulmonary emboli. Mild scarring in the  lingula. Healing left rib fractures. Electronically Signed   By: Alcide Clever M.D.   On: 06/30/2023 01:42   DG Chest 2 View  Result Date: 06/29/2023 CLINICAL DATA:  SOB, increased O2 needs EXAM: CHEST - 2 VIEW COMPARISON:  CT chest  06/19/2023, chest x-ray 06/22/2023 FINDINGS: The heart and mediastinal contours are within normal limits. No focal consolidation. No pulmonary edema. Blunting of the left costophrenic angle with no definite pleural effusion on lateral view. No right pleural effusion. No pneumothorax. No acute osseous abnormality. IMPRESSION: Blunting of the left costophrenic angle with no definite pleural effusion on lateral view. Query scarring. Possible underlying atelectasis Electronically Signed   By: Tish Frederickson M.D.   On: 06/29/2023 21:59    Pending Labs Unresulted Labs (From admission, onward)     Start     Ordered   07/01/23 0500  CBC  Tomorrow morning,   R        06/30/23 1407            Vitals/Pain Today's Vitals   06/30/23 1000 06/30/23 1100 06/30/23 1140 06/30/23 1200  BP: (!) 145/96 130/79  131/77  Pulse: 73 73  77  Resp: (!) 26 16  18   Temp:   98.6 F (37 C) 98.6 F (37 C)  TempSrc:   Oral Oral  SpO2: 97% 98%  100%  PainSc:        Isolation Precautions No active isolations  Medications Medications  amLODipine (NORVASC) tablet 10 mg (10 mg Oral Given 06/30/23 0942)  atorvastatin (LIPITOR) tablet 40 mg (40 mg Oral Given 06/30/23 0942)  carvedilol (COREG) tablet 3.125 mg (3.125 mg Oral Given 06/30/23 0744)  busPIRone (BUSPAR) tablet 20 mg (20 mg Oral Given 06/30/23 0941)  doxepin (SINEQUAN) capsule 50 mg (has no administration in time range)  cariprazine (VRAYLAR) capsule 6 mg (6 mg Oral Given 06/30/23 0942)  benztropine (COGENTIN) tablet 1 mg (1 mg Oral Given 06/30/23 0942)  lidocaine (LIDODERM) 5 % 1 patch (0 patches Transdermal Hold 06/30/23 0746)  enoxaparin (LOVENOX) injection 52.5 mg (52.5 mg Subcutaneous Given 06/30/23 0745)  methylPREDNISolone sodium succinate (SOLU-MEDROL) 40 mg/mL injection 40 mg (40 mg Intravenous Given 06/30/23 0850)    Followed by  predniSONE (DELTASONE) tablet 40 mg (has no administration in time range)  ipratropium-albuterol (DUONEB) 0.5-2.5  (3) MG/3ML nebulizer solution 3 mL (3 mLs Nebulization Given 06/30/23 1235)  0.9 %  sodium chloride infusion ( Intravenous New Bag/Given 06/30/23 0403)  acetaminophen (TYLENOL) tablet 650 mg (has no administration in time range)    Or  acetaminophen (TYLENOL) suppository 650 mg (has no administration in time range)  traZODone (DESYREL) tablet 25 mg (has no administration in time range)  magnesium hydroxide (MILK OF MAGNESIA) suspension 30 mL (has no administration in time range)  ondansetron (ZOFRAN) tablet 4 mg (has no administration in time range)    Or  ondansetron (ZOFRAN) injection 4 mg (has no administration in time range)  guaiFENesin (MUCINEX) 12 hr tablet 600 mg (600 mg Oral Given 06/30/23 0941)  chlorpheniramine-HYDROcodone (TUSSIONEX) 10-8 MG/5ML suspension 5 mL (has no administration in time range)  azithromycin (ZITHROMAX) tablet 500 mg (500 mg Oral Given 06/30/23 0941)    Followed by  azithromycin (ZITHROMAX) tablet 250 mg (has no administration in time range)  ipratropium-albuterol (DUONEB) 0.5-2.5 (3) MG/3ML nebulizer solution 3 mL (3 mLs Nebulization Given 06/29/23 2027)  ipratropium-albuterol (DUONEB) 0.5-2.5 (3) MG/3ML nebulizer solution 3 mL (3 mLs Nebulization Given 06/29/23 2042)  methylPREDNISolone sodium succinate (SOLU-MEDROL) 125  mg/2 mL injection 125 mg (125 mg Intravenous Given 06/29/23 2100)  iohexol (OMNIPAQUE) 350 MG/ML injection 100 mL (75 mLs Intravenous Contrast Given 06/29/23 2346)  midazolam (VERSED) injection 1 mg (1 mg Intravenous Given 06/29/23 2105)  haloperidol lactate (HALDOL) injection 5 mg (5 mg Intramuscular Given 06/29/23 2211)    Mobility walks with person assist     Focused Assessments Pulmonary Assessment Handoff:  Lung sounds: Bilateral Breath Sounds: Diminished O2 Device: Nasal Cannula O2 Flow Rate (L/min): 3 L/min    R Recommendations: See Admitting Provider Note  Report given to:   Additional Notes:

## 2023-06-30 NOTE — Assessment & Plan Note (Signed)
-   We will continue anti-hypertensive therapy. 

## 2023-06-30 NOTE — Assessment & Plan Note (Signed)
-  This is clearly the culprit for #1. - Management as above.

## 2023-07-01 ENCOUNTER — Other Ambulatory Visit: Payer: Self-pay

## 2023-07-01 DIAGNOSIS — J9601 Acute respiratory failure with hypoxia: Secondary | ICD-10-CM | POA: Diagnosis not present

## 2023-07-01 DIAGNOSIS — J9602 Acute respiratory failure with hypercapnia: Secondary | ICD-10-CM | POA: Diagnosis not present

## 2023-07-01 LAB — RESPIRATORY PANEL BY PCR

## 2023-07-01 LAB — CBC
HCT: 31.1 % — ABNORMAL LOW (ref 36.0–46.0)
Hemoglobin: 9.8 g/dL — ABNORMAL LOW (ref 12.0–15.0)
MCH: 23.4 pg — ABNORMAL LOW (ref 26.0–34.0)
MCHC: 31.5 g/dL (ref 30.0–36.0)
MCV: 74.4 fL — ABNORMAL LOW (ref 80.0–100.0)
Platelets: 402 10*3/uL — ABNORMAL HIGH (ref 150–400)
RBC: 4.18 MIL/uL (ref 3.87–5.11)
RDW: 17.7 % — ABNORMAL HIGH (ref 11.5–15.5)
WBC: 22.5 10*3/uL — ABNORMAL HIGH (ref 4.0–10.5)
nRBC: 0.1 % (ref 0.0–0.2)

## 2023-07-01 LAB — MRSA NEXT GEN BY PCR, NASAL: MRSA by PCR Next Gen: NOT DETECTED

## 2023-07-01 LAB — SARS CORONAVIRUS 2 BY RT PCR: SARS Coronavirus 2 by RT PCR: NEGATIVE

## 2023-07-01 NOTE — Plan of Care (Signed)
  Problem: Respiratory: Goal: Ability to maintain a clear airway will improve 07/01/2023 0412 by Vallery Ridge, RN Outcome: Progressing 07/01/2023 0412 by Vallery Ridge, RN Outcome: Progressing Goal: Levels of oxygenation will improve 07/01/2023 0412 by Vallery Ridge, RN Outcome: Progressing 07/01/2023 0412 by Vallery Ridge, RN Outcome: Progressing Goal: Ability to maintain adequate ventilation will improve 07/01/2023 0412 by Vallery Ridge, RN Outcome: Progressing 07/01/2023 0412 by Vallery Ridge, RN Outcome: Progressing   Problem: Activity: Goal: Ability to tolerate increased activity will improve 07/01/2023 0412 by Vallery Ridge, RN Outcome: Progressing 07/01/2023 0412 by Vallery Ridge, RN Outcome: Progressing Goal: Will verbalize the importance of balancing activity with adequate rest periods 07/01/2023 0412 by Vallery Ridge, RN Outcome: Progressing 07/01/2023 0412 by Vallery Ridge, RN Outcome: Progressing

## 2023-07-01 NOTE — Progress Notes (Signed)
Progress Note   Patient: Courtney Carrillo QIO:[id number] DOB: 09/24/1965 DOA: 06/29/2023     1 DOS: the patient was seen and examined on 07/01/2023   Brief hospital course:  Courtney Carrillo is a 57 y.o. female with medical history significant for hypertension, dyslipidemia, OSA on CPAP, anxiety and depression, who presented to the emergency room with acute onset of worsening dyspnea with associated wheezing for the last couple of days with no significant cough.  She denies any fever or chills.  No chest pain or palpitations.  No nausea or vomiting or abdominal pain.  No bleeding diathesis.  No dysuria, oliguria or hematuria or flank pain.   ED Course: Upon presentation to the emergency room heart rate was 116 and respiratory rate 28 with BP of 148/70 and pulse symmetry was 100% on BiPAP.  Labs revealed blood gas with pH 7.34 and HCO3 of 32.9 with pCO2 61 and pO2 of 34 and O2 sat of 49.5% on room air.  CMP was remarkable for hypokalemia of 2.9 and high sensitive troponin I was 12 and later 19.  CBC showed leukocytosis of 18.8 with thrombocytosis.  D-dimer was 1.3. EKG as reviewed by me : EKG showed normal sinus rhythm with rate of 100 with right axis deviation and poor R wave progression. Imaging: Two-view chest x-ray showed blunting of left costophrenic angle with no definite pleural effusion.  Chest CTA revealed no evidence for PE.  It showed mild scarring in the lingula and healing of left rib fractures.    Assessment and Plan:   Acute on chronic respiratory failure with hypoxia and hypercapnia (HCC) Thought to be secondary to acute COPD exacerbation Patient required BiPAP on admission due to increased work of breathing.  At baseline she wears 3 L of oxygen Continue IV Solu-Medrol as well as nebulized bronchodilator therapy with duonebs q.i.d. and q.4 hours p.r.n.. Mucolytic therapy will be provided with Mucinex and antibiotic therapy with IV Rocephin. Continue oxygen supplementation to  maintain pulse oximetry greater than 92% Worsening leukocytosis appears to be secondary to steroid administration We will consult pulmonary     COPD with acute exacerbation (HCC) - This is clearly the culprit for #1. - Management as above.   Dyslipidemia - Will continue statin therapy.   Essential hypertension Continue amlodipine and carvedilol   Bipolar disorder Continue buspirone, Vraylar, doxepin, trazodone and benztropine       Subjective: Feels better.  Less short of breath  Physical Exam: Vitals:   07/01/23 0340 07/01/23 0728 07/01/23 0808 07/01/23 1351  BP: (!) 111/56  102/62   Pulse: 61  68   Resp: 18  17   Temp: 98.3 F (36.8 C)  98.3 F (36.8 C)   TempSrc: Oral  Oral   SpO2: 99% 98% 98% 98%   General exam: awake, alert, no acute distress HEENT: atraumatic, clear conjunctiva, anicteric sclera, moist mucus membranes, hearing grossly normal  Respiratory system: severely diminished breath sounds throughout, no wheezes or rhonchi, normal respiratory effort at rest on 3 L/min [place] O2. Cardiovascular system: normal S1/S2, RRR,  no pedal edema.   Gastrointestinal system: soft, NT, ND, no HSM felt, +bowel sounds. Central nervous system: A&O x3. no gross focal neurologic deficits, normal speech Extremities: moves all, no edema, normal tone Skin: dry, intact, normal temperature Psychiatry: normal mood, congruent affect, judgement and insight appear normal  Data Reviewed: Labs reviewed.  White count 22,000 There are no new results to review at this time.  Family Communication: Plan of  care discussed with patient in detail.  Will consult pulmonary  Disposition: Status is: Inpatient Remains inpatient appropriate because: Symptom control  Planned Discharge Destination: Home    Time spent: 32 minutes  Author: Lucile Shutters, MD 07/01/2023 2:39 PM  For on call review [email or web address].

## 2023-07-01 NOTE — TOC Progression Note (Signed)
Transition of Care [facility]) - Progression Note    Patient Details  Name: Courtney Carrillo MRN: [id number] Date of Birth: Jun 03, 1966  Transition of Care [facility]) CM/SW Contact  Margarito Liner, LCSW Phone Number: 07/01/2023, 3:20 PM  Clinical Narrative:   Per MD, patient is requesting portable oxygen. MD entered order for POC eval. [employer] will contact patient to schedule.  Expected Discharge Plan: Home/Self Care    Expected Discharge Plan and Services                                               Social Determinants of Health (SDOH) Interventions SDOH Screenings   Food Insecurity: No Food Insecurity (06/30/2023)  Housing: Patient Declined (06/30/2023)  Transportation Needs: No Transportation Needs (06/30/2023)  Utilities: Not At Risk (06/30/2023)  Alcohol Screen: Low Risk  (01/28/2023)  Depression (PHQ2-9): Low Risk  (06/17/2023)  Financial Resource Strain: Low Risk  (12/25/2022)  Physical Activity: Inactive (06/11/2023)  Social Connections: Socially Isolated (06/11/2023)  Stress: No Stress Concern Present (12/25/2022)  Tobacco Use: Medium Risk (06/30/2023)  Health Literacy: Adequate Health Literacy (06/11/2023)    Readmission Risk Interventions    06/30/2023   10:20 AM 06/23/2023   11:10 AM  Readmission Risk Prevention Plan  Transportation Screening Complete Complete  PCP or Specialist Appt within 3-5 Days  Complete  HRI or Home Care Consult  Complete  Social Work Consult for Recovery Care Planning/Counseling  Complete  Palliative Care Screening  Not Applicable  Medication Review [employer]) Referral to Pharmacy Referral to Pharmacy  PCP or Specialist appointment within 3-5 days of discharge Complete   HRI or Home Care Consult Complete   SW Recovery Care/Counseling Consult Complete   Palliative Care Screening Not Applicable   Skilled Nursing Facility Not Applicable

## 2023-07-01 NOTE — Consult Note (Signed)
PULMONOLOGY         Date: 07/01/2023,   MRN# [id number] Courtney Carrillo [birth year]     Admission                  Current   Referring provider: Dr Joylene Igo   CHIEF COMPLAINT:   Acute exacerbation of COPD   HISTORY OF PRESENT ILLNESS   This is a 57 yo F with chronic anemia, anxiety, depression, headache, dyslipidemia, OSA on CPAP, COPD with recent exacerbation requiring hospitalization  05/04/23 with left sided 5&6th rib fractures.  She had pleural effusions at that time.  She had chest tube placement she was treated with antibiotics.  Her most recent FEV1 was 56% with 40 pack year history of smoking.    PAST MEDICAL HISTORY   Past Medical History:  Diagnosis Date   Anemia    2015   Anxiety    Depression    Headache    H/O   Hyperlipidemia    Hypertension    Sleep apnea    CPAP     SURGICAL HISTORY   Past Surgical History:  Procedure Laterality Date   BACK SURGERY     LUMBAR   CARPAL TUNNEL RELEASE Right 06/19/2015   Procedure: CARPAL TUNNEL RELEASE;  Surgeon: Kennedy Bucker, MD;  Location: [facility] ORS;  Service: Orthopedics;  Laterality: Right;   COLONOSCOPY WITH PROPOFOL N/A 01/28/2021   Procedure: COLONOSCOPY WITH PROPOFOL;  Surgeon: Toney Reil, MD;  Location: [facility] ENDOSCOPY;  Service: Gastroenterology;  Laterality: N/A;   DILATION AND CURETTAGE OF UTERUS     FLEXIBLE SIGMOIDOSCOPY N/A 04/10/2021   Procedure: FLEXIBLE SIGMOIDOSCOPY;  Surgeon: Toney Reil, MD;  Location: [facility] ENDOSCOPY;  Service: Gastroenterology;  Laterality: N/A;     FAMILY HISTORY   Family History  Problem Relation Age of Onset   Diabetes Mother    Breast cancer Sister      SOCIAL HISTORY   Social History   Tobacco Use   Smoking status: Former    Current packs/day: 0.50    Average packs/day: 0.5 packs/day for 35.2 years (17.6 ttl pk-yrs)    Types: Cigarettes    Start date: 09/12/2021   Smokeless tobacco: Never  Vaping Use   Vaping status: Never Used   Substance Use Topics   Alcohol use: No   Drug use: Yes    Frequency: 7.0 times per week    Types: Marijuana     MEDICATIONS    Home Medication:    Current Medication:  Current Facility-Administered Medications:    acetaminophen (TYLENOL) tablet 650 mg, 650 mg, Oral, Q6H PRN **OR** acetaminophen (TYLENOL) suppository 650 mg, 650 mg, Rectal, Q6H PRN, Mansy, Jan A, MD   amLODipine (NORVASC) tablet 10 mg, 10 mg, Oral, Daily, Mansy, Jan A, MD, 10 mg at 06/30/23 0942   atorvastatin (LIPITOR) tablet 40 mg, 40 mg, Oral, Daily, Mansy, Jan A, MD, 40 mg at 07/01/23 0936   [COMPLETED] azithromycin (ZITHROMAX) tablet 500 mg, 500 mg, Oral, Daily, 500 mg at 06/30/23 0941 **FOLLOWED BY** azithromycin (ZITHROMAX) tablet 250 mg, 250 mg, Oral, Daily, Esaw Grandchild A, DO, 250 mg at 07/01/23 0936   benztropine (COGENTIN) tablet 1 mg, 1 mg, Oral, q morning, Mansy, Jan A, MD, 1 mg at 07/01/23 0936   busPIRone (BUSPAR) tablet 20 mg, 20 mg, Oral, BID, Mansy, Jan A, MD, 20 mg at 07/01/23 8295   cariprazine (VRAYLAR) capsule 6 mg, 6 mg, Oral, Daily, Mansy, Vernetta Honey, MD, 6  mg at 07/01/23 0936   carvedilol (COREG) tablet 3.125 mg, 3.125 mg, Oral, BID WC, Mansy, Jan A, MD, 3.125 mg at 06/30/23 1630   chlorpheniramine-HYDROcodone (TUSSIONEX) 10-8 MG/5ML suspension 5 mL, 5 mL, Oral, Q12H PRN, Mansy, Jan A, MD   doxepin (SINEQUAN) capsule 50 mg, 50 mg, Oral, QHS, Mansy, Jan A, MD, 50 mg at 06/30/23 2209   enoxaparin (LOVENOX) injection 52.5 mg, 0.5 mg/kg, Subcutaneous, Q24H, Mansy, Jan A, MD, 52.5 mg at 07/01/23 9147   guaiFENesin (MUCINEX) 12 hr tablet 600 mg, 600 mg, Oral, BID, Mansy, Jan A, MD, 600 mg at 07/01/23 8295   ipratropium-albuterol (DUONEB) 0.5-2.5 (3) MG/3ML nebulizer solution 3 mL, 3 mL, Nebulization, Q6H, Denton Lank, Kelly A, DO, 3 mL at 07/01/23 0728   lidocaine (LIDODERM) 5 % 1 patch, 1 patch, Transdermal, Q12H, Mansy, Jan A, MD   magnesium hydroxide (MILK OF MAGNESIA) suspension 30 mL, 30 mL, Oral,  Daily PRN, Mansy, Jan A, MD   ondansetron (ZOFRAN) tablet 4 mg, 4 mg, Oral, Q6H PRN **OR** ondansetron (ZOFRAN) injection 4 mg, 4 mg, Intravenous, Q6H PRN, Mansy, Jan A, MD   [COMPLETED] methylPREDNISolone sodium succinate (SOLU-MEDROL) 40 mg/mL injection 40 mg, 40 mg, Intravenous, Q12H, 40 mg at 06/30/23 2210 **FOLLOWED BY** predniSONE (DELTASONE) tablet 40 mg, 40 mg, Oral, Q breakfast, Mansy, Jan A, MD, 40 mg at 07/01/23 6213   traZODone (DESYREL) tablet 25 mg, 25 mg, Oral, QHS PRN, Mansy, Jan A, MD    ALLERGIES   Lisinopril, Losartan, and Other     REVIEW OF SYSTEMS    Review of Systems:  Gen:  Denies  fever, sweats, chills weigh loss  HEENT: Denies blurred vision, double vision, ear pain, eye pain, hearing loss, nose bleeds, sore throat Cardiac:  No dizziness, chest pain or heaviness, chest tightness,edema Resp:   reports dyspnea chronically  Gi: Denies swallowing difficulty, stomach pain, nausea or vomiting, diarrhea, constipation, bowel incontinence Gu:  Denies bladder incontinence, burning urine Ext:   Denies Joint pain, stiffness or swelling Skin: Denies  skin rash, easy bruising or bleeding or hives Endoc:  Denies polyuria, polydipsia , polyphagia or weight change Psych:   Denies depression, insomnia or hallucinations   Other:  All other systems negative   VS: BP 102/62 (BP Location: Right Arm)   Pulse 68   Temp 98.3 F (36.8 C) (Oral)   Resp 17   SpO2 98%      PHYSICAL EXAM    GENERAL:NAD, no fevers, chills, no weakness no fatigue HEAD: Normocephalic, atraumatic.  EYES: Pupils equal, round, reactive to light. Extraocular muscles intact. No scleral icterus.  MOUTH: Moist mucosal membrane. Dentition intact. No abscess noted.  EAR, NOSE, THROAT: Clear without exudates. No external lesions.  NECK: Supple. No thyromegaly. No nodules. No JVD.  PULMONARY: decreased breath sounds with mild rhonchi worse at bases bilaterally.  CARDIOVASCULAR: S1 and S2. Regular  rate and rhythm. No murmurs, rubs, or gallops. No edema. Pedal pulses 2+ bilaterally.  GASTROINTESTINAL: Soft, nontender, nondistended. No masses. Positive bowel sounds. No hepatosplenomegaly.  MUSCULOSKELETAL: No swelling, clubbing, or edema. Range of motion full in all extremities.  NEUROLOGIC: Cranial nerves II through XII are intact. No gross focal neurological deficits. Sensation intact. Reflexes intact.  SKIN: No ulceration, lesions, rashes, or cyanosis. Skin warm and dry. Turgor intact.  PSYCHIATRIC: Mood, affect within normal limits. The patient is awake, alert and oriented x 3. Insight, judgment intact.       IMAGING     ASSESSMENT/PLAN  Acute exacerbation of COPD   - recent hospitalization with empyema requiring chest tube placement   - no effusions noted on CT chest this admission   -no filling defect to suggest PE  - rib fractures healing well  - Agree with zithromax and solumedrol transition to prednisone  - PT OT and chest physiotherapy   - flutter valve and incentive spirometery   -viral testing   - CRP trend  - MRSA pcr  -procalcitonin         Thank you for allowing me to participate in the care of this patient.   Patient/Family are satisfied with care plan and all questions have been answered.    Provider disclosure: Patient with at least one acute or chronic illness or injury that poses a threat to life or bodily function and is being managed actively during this encounter.  All of the below services have been performed independently by signing provider:  review of prior documentation from internal and or external health records.  Review of previous and current lab results.  Interview and comprehensive assessment during patient visit today. Review of current and previous chest radiographs/CT scans. Discussion of management and test interpretation with health care team and patient/family.   This document was prepared using Dragon voice recognition software  and may include unintentional dictation errors.     Vida Rigger, M.D.  Division of Pulmonary & Critical Care Medicine

## 2023-07-02 DIAGNOSIS — J9601 Acute respiratory failure with hypoxia: Secondary | ICD-10-CM | POA: Diagnosis not present

## 2023-07-02 DIAGNOSIS — J9602 Acute respiratory failure with hypercapnia: Secondary | ICD-10-CM | POA: Diagnosis not present

## 2023-07-02 LAB — PROCALCITONIN: Procalcitonin: <0.1 ng/mL

## 2023-07-02 LAB — C-REACTIVE PROTEIN: CRP: <0.5 mg/dL (ref <1.0)

## 2023-07-02 MED ORDER — AZITHROMYCIN 250 MG PO TABS: ORAL_TABLET | ORAL | 0 refills | Status: AC

## 2023-07-02 MED ORDER — PREDNISONE 10 MG PO TABS: ORAL_TABLET | ORAL | 0 refills | Status: DC

## 2023-07-02 MED ORDER — PANTOPRAZOLE SODIUM 40 MG PO TBEC: 40.0000 mg | DELAYED_RELEASE_TABLET | Freq: Every day | ORAL | 1 refills | Status: DC

## 2023-07-02 NOTE — Progress Notes (Signed)
PULMONOLOGY         Date: 07/02/2023,   MRN# [id number] Courtney Carrillo [birth year]     Admission                  Current   Referring provider: Dr Joylene Igo   CHIEF COMPLAINT:   Acute exacerbation of COPD   HISTORY OF PRESENT ILLNESS   This is a 56 yo F with chronic anemia, anxiety, depression, headache, dyslipidemia, OSA on CPAP, COPD with recent exacerbation requiring hospitalization  05/04/23 with left sided 5&6th rib fractures.  She had pleural effusions at that time.  She had chest tube placement she was treated with antibiotics.  Her most recent FEV1 was 56% with 40 pack year history of smoking.   07/02/23- patient is improved , she is stable on 2L/min .  She has at home tanks and non portable stationary concentrator. She would like to have POC at home and we discussed this is something we can order on outpatient if her insurance allows this.  She works at [employer] and would like to have 2 days off to recover.  She takes busbar at home and states her anxiety is not well controlled and seems to flare up upon respiratory symptoms with air hunger. She is on zithromax and prednisone 40mg .  She may continue zithromax 250 x 5 more days and prednisone may be continued with taper by 5mg  daily until finished.  She also has uncontrolled GERD which may exacerbate cough, recommendation for ppi such as omeprazole 40mg . I can follow up with her in clinic with [facility] pulmonary , she has Appt Nov 26.    PAST MEDICAL HISTORY   Past Medical History:  Diagnosis Date   Anemia    2015   Anxiety    Depression    Headache    H/O   Hyperlipidemia    Hypertension    Sleep apnea    CPAP     SURGICAL HISTORY   Past Surgical History:  Procedure Laterality Date   BACK SURGERY     LUMBAR   CARPAL TUNNEL RELEASE Right 06/19/2015   Procedure: CARPAL TUNNEL RELEASE;  Surgeon: Kennedy Bucker, MD;  Location: [facility] ORS;  Service: Orthopedics;  Laterality: Right;   COLONOSCOPY WITH PROPOFOL N/A  01/28/2021   Procedure: COLONOSCOPY WITH PROPOFOL;  Surgeon: Toney Reil, MD;  Location: [facility] ENDOSCOPY;  Service: Gastroenterology;  Laterality: N/A;   DILATION AND CURETTAGE OF UTERUS     FLEXIBLE SIGMOIDOSCOPY N/A 04/10/2021   Procedure: FLEXIBLE SIGMOIDOSCOPY;  Surgeon: Toney Reil, MD;  Location: [facility] ENDOSCOPY;  Service: Gastroenterology;  Laterality: N/A;     FAMILY HISTORY   Family History  Problem Relation Age of Onset   Diabetes Mother    Breast cancer Sister      SOCIAL HISTORY   Social History   Tobacco Use   Smoking status: Former    Current packs/day: 0.50    Average packs/day: 0.5 packs/day for 35.2 years (17.6 ttl pk-yrs)    Types: Cigarettes    Start date: 09/12/2021   Smokeless tobacco: Never  Vaping Use   Vaping status: Never Used  Substance Use Topics   Alcohol use: No   Drug use: Yes    Frequency: 7.0 times per week    Types: Marijuana     MEDICATIONS    Home Medication:    Current Medication:  Current Facility-Administered Medications:    acetaminophen (TYLENOL) tablet 650 mg, 650 mg, Oral,  Q6H PRN **OR** acetaminophen (TYLENOL) suppository 650 mg, 650 mg, Rectal, Q6H PRN, Mansy, Jan A, MD   amLODipine (NORVASC) tablet 10 mg, 10 mg, Oral, Daily, Mansy, Jan A, MD, 10 mg at 06/30/23 6644   atorvastatin (LIPITOR) tablet 40 mg, 40 mg, Oral, Daily, Mansy, Jan A, MD, 40 mg at 07/01/23 0936   [COMPLETED] azithromycin (ZITHROMAX) tablet 500 mg, 500 mg, Oral, Daily, 500 mg at 06/30/23 0941 **FOLLOWED BY** azithromycin (ZITHROMAX) tablet 250 mg, 250 mg, Oral, Daily, Esaw Grandchild A, DO, 250 mg at 07/01/23 0936   benztropine (COGENTIN) tablet 1 mg, 1 mg, Oral, q morning, Mansy, Jan A, MD, 1 mg at 07/01/23 0347   busPIRone (BUSPAR) tablet 20 mg, 20 mg, Oral, BID, Mansy, Jan A, MD, 20 mg at 07/01/23 2121   cariprazine (VRAYLAR) capsule 6 mg, 6 mg, Oral, Daily, Mansy, Jan A, MD, 6 mg at 07/01/23 4259   carvedilol (COREG) tablet 3.125 mg,  3.125 mg, Oral, BID WC, Mansy, Jan A, MD, 3.125 mg at 07/01/23 1732   chlorpheniramine-HYDROcodone (TUSSIONEX) 10-8 MG/5ML suspension 5 mL, 5 mL, Oral, Q12H PRN, Mansy, Jan A, MD   doxepin (SINEQUAN) capsule 50 mg, 50 mg, Oral, QHS, Mansy, Jan A, MD, 50 mg at 07/01/23 2122   enoxaparin (LOVENOX) injection 52.5 mg, 0.5 mg/kg, Subcutaneous, Q24H, Mansy, Jan A, MD, 52.5 mg at 07/01/23 5638   guaiFENesin (MUCINEX) 12 hr tablet 600 mg, 600 mg, Oral, BID, Mansy, Jan A, MD, 600 mg at 07/01/23 2121   ipratropium-albuterol (DUONEB) 0.5-2.5 (3) MG/3ML nebulizer solution 3 mL, 3 mL, Nebulization, Q6H, Griffith, Kelly A, DO, 3 mL at 07/02/23 0725   lidocaine (LIDODERM) 5 % 1 patch, 1 patch, Transdermal, Q12H, Mansy, Jan A, MD   magnesium hydroxide (MILK OF MAGNESIA) suspension 30 mL, 30 mL, Oral, Daily PRN, Mansy, Jan A, MD   ondansetron (ZOFRAN) tablet 4 mg, 4 mg, Oral, Q6H PRN **OR** ondansetron (ZOFRAN) injection 4 mg, 4 mg, Intravenous, Q6H PRN, Mansy, Jan A, MD   [COMPLETED] methylPREDNISolone sodium succinate (SOLU-MEDROL) 40 mg/mL injection 40 mg, 40 mg, Intravenous, Q12H, 40 mg at 06/30/23 2210 **FOLLOWED BY** predniSONE (DELTASONE) tablet 40 mg, 40 mg, Oral, Q breakfast, Mansy, Jan A, MD, 40 mg at 07/01/23 7564   traZODone (DESYREL) tablet 25 mg, 25 mg, Oral, QHS PRN, Mansy, Jan A, MD    ALLERGIES   Lisinopril, Losartan, and Other     REVIEW OF SYSTEMS    Review of Systems:  Gen:  Denies  fever, sweats, chills weigh loss  HEENT: Denies blurred vision, double vision, ear pain, eye pain, hearing loss, nose bleeds, sore throat Cardiac:  No dizziness, chest pain or heaviness, chest tightness,edema Resp:   reports dyspnea chronically  Gi: Denies swallowing difficulty, stomach pain, nausea or vomiting, diarrhea, constipation, bowel incontinence Gu:  Denies bladder incontinence, burning urine Ext:   Denies Joint pain, stiffness or swelling Skin: Denies  skin rash, easy bruising or bleeding or  hives Endoc:  Denies polyuria, polydipsia , polyphagia or weight change Psych:   Denies depression, insomnia or hallucinations   Other:  All other systems negative   VS: BP (!) 147/92 (BP Location: Left Arm)   Pulse (!) 56   Temp 98.2 F (36.8 C) (Oral)   Resp 18   SpO2 96%      PHYSICAL EXAM    GENERAL:NAD, no fevers, chills, no weakness no fatigue HEAD: Normocephalic, atraumatic.  EYES: Pupils equal, round, reactive to light. Extraocular muscles intact. No scleral  icterus.  MOUTH: Moist mucosal membrane. Dentition intact. No abscess noted.  EAR, NOSE, THROAT: Clear without exudates. No external lesions.  NECK: Supple. No thyromegaly. No nodules. No JVD.  PULMONARY: decreased breath sounds with mild rhonchi worse at bases bilaterally.  CARDIOVASCULAR: S1 and S2. Regular rate and rhythm. No murmurs, rubs, or gallops. No edema. Pedal pulses 2+ bilaterally.  GASTROINTESTINAL: Soft, nontender, nondistended. No masses. Positive bowel sounds. No hepatosplenomegaly.  MUSCULOSKELETAL: No swelling, clubbing, or edema. Range of motion full in all extremities.  NEUROLOGIC: Cranial nerves II through XII are intact. No gross focal neurological deficits. Sensation intact. Reflexes intact.  SKIN: No ulceration, lesions, rashes, or cyanosis. Skin warm and dry. Turgor intact.  PSYCHIATRIC: Mood, affect within normal limits. The patient is awake, alert and oriented x 3. Insight, judgment intact.       IMAGING     ASSESSMENT/PLAN   Acute exacerbation of COPD   - recent hospitalization with empyema requiring chest tube placement   - no effusions noted on CT chest this admission   -no filling defect to suggest PE  - rib fractures healing well  - Agree with zithromax and solumedrol transition to prednisone  - PT OT and chest physiotherapy   - flutter valve and incentive spirometery   -viral testing   - CRP trend- normal   - MRSA pcr  -procalcitonin-negative -zityhromax and pred upon  dc with OP follow up        Thank you for allowing me to participate in the care of this patient.   Patient/Family are satisfied with care plan and all questions have been answered.    Provider disclosure: Patient with at least one acute or chronic illness or injury that poses a threat to life or bodily function and is being managed actively during this encounter.  All of the below services have been performed independently by signing provider:  review of prior documentation from internal and or external health records.  Review of previous and current lab results.  Interview and comprehensive assessment during patient visit today. Review of current and previous chest radiographs/CT scans. Discussion of management and test interpretation with health care team and patient/family.   This document was prepared using Dragon voice recognition software and may include unintentional dictation errors.     Vida Rigger, M.D.  Division of Pulmonary & Critical Care Medicine

## 2023-07-02 NOTE — TOC Transition Note (Signed)
Transition of Care [facility]) - CM/SW Discharge Note   Patient Details  Name: Courtney Carrillo MRN: [id number] Date of Birth: 07-17-1966  [facility] CM/SW Contact:  Margarito Liner, LCSW Phone Number: 07/02/2023, 10:08 AM   Clinical Narrative:  Patient has orders to discharge home today. [employer] is aware and will contact patient to schedule outpatient POC evaluation. No further concerns. CSW signing off.  Final next level of care: Home/Self Care Barriers to Discharge: Barriers Resolved   Patient Goals and CMS Choice      Discharge Placement                      Patient and family notified of of transfer: 07/02/23  Discharge Plan and Services Additional resources added to the After Visit Summary for                                       Social Determinants of Health (SDOH) Interventions SDOH Screenings   Food Insecurity: No Food Insecurity (06/30/2023)  Housing: Patient Declined (06/30/2023)  Transportation Needs: No Transportation Needs (06/30/2023)  Utilities: Not At Risk (06/30/2023)  Alcohol Screen: Low Risk  (01/28/2023)  Depression (PHQ2-9): Low Risk  (06/17/2023)  Financial Resource Strain: Low Risk  (12/25/2022)  Physical Activity: Inactive (06/11/2023)  Social Connections: Socially Isolated (06/11/2023)  Stress: No Stress Concern Present (12/25/2022)  Tobacco Use: Medium Risk (06/30/2023)  Health Literacy: Adequate Health Literacy (06/11/2023)     Readmission Risk Interventions    06/30/2023   10:20 AM 06/23/2023   11:10 AM  Readmission Risk Prevention Plan  Transportation Screening Complete Complete  PCP or Specialist Appt within 3-5 Days  Complete  HRI or Home Care Consult  Complete  Social Work Consult for Recovery Care Planning/Counseling  Complete  Palliative Care Screening  Not Applicable  Medication Review [employer]) Referral to Pharmacy Referral to Pharmacy  PCP or Specialist appointment within 3-5 days of discharge  Complete   HRI or Home Care Consult Complete   SW Recovery Care/Counseling Consult Complete   Palliative Care Screening Not Applicable   Skilled Nursing Facility Not Applicable

## 2023-07-02 NOTE — Plan of Care (Signed)
  Problem: Education: Goal: Knowledge of General Education information will improve Description: Including pain rating scale, medication(s)/side effects and non-pharmacologic comfort measures Outcome: Progressing   Problem: Respiratory: Goal: Ability to maintain a clear airway will improve Outcome: Progressing Goal: Levels of oxygenation will improve Outcome: Progressing Goal: Ability to maintain adequate ventilation will improve Outcome: Progressing

## 2023-07-02 NOTE — Discharge Summary (Signed)
Physician Discharge Summary   Patient: Courtney Carrillo MRN: [id number] DOB: [birth year]  Admit date:     06/29/2023  Discharge date: 07/02/23  Discharge Physician: Roni Scow   PCP: Lorre Munroe, NP   Recommendations at discharge:   Keep scheduled follow-up appointment with pulmonary Take medications as recommended  Discharge Diagnoses: Principal Problem:   Acute respiratory failure with hypoxia and hypercapnia (HCC) Active Problems:   COPD with acute exacerbation (HCC)   Anxiety and depression   Obesity (BMI 30-39.9)   Essential hypertension   Dyslipidemia   Acute on chronic respiratory failure with hypoxia and hypercapnia (HCC)  Resolved Problems:   * No resolved hospital problems. *  Hospital Course: Courtney Carrillo is a 57 y.o. female with medical history significant for hypertension, dyslipidemia, OSA on CPAP, anxiety and depression, who presented to the emergency room with acute onset of worsening dyspnea with associated wheezing for the last couple of days with no significant cough.  She denies any fever or chills.  No chest pain or palpitations.  No nausea or vomiting or abdominal pain.  No bleeding diathesis.  No dysuria, oliguria or hematuria or flank pain.   ED Course: Upon presentation to the emergency room heart rate was 116 and respiratory rate 28 with BP of 148/70 and pulse symmetry was 100% on BiPAP.  Labs revealed blood gas with pH 7.34 and HCO3 of 32.9 with pCO2 61 and pO2 of 34 and O2 sat of 49.5% on room air.  CMP was remarkable for hypokalemia of 2.9 and high sensitive troponin I was 12 and later 19.  CBC showed leukocytosis of 18.8 with thrombocytosis.  D-dimer was 1.3. EKG as reviewed by me : EKG showed normal sinus rhythm with rate of 100 with right axis deviation and poor R wave progression. Imaging: Two-view chest x-ray showed blunting of left costophrenic angle with no definite pleural effusion.  Chest CTA revealed no evidence for PE.  It showed mild  scarring in the lingula and healing of left rib fractures.    Assessment and Plan:  Acute on chronic respiratory failure with hypoxia and hypercapnia (HCC) Thought to be secondary to acute COPD exacerbation Patient required BiPAP on admission due to increased work of breathing.  At baseline she wears 2 L of oxygen She was on IV Solu-Medrol but will be transition to oral prednisone as an outpatient  Continue nebulized bronchodilator therapy with duonebs q.i.d. and q.4 hours p.r.n.Marland Kitchen Appreciate pulmonary input.  Recommends Zithromax to 50 mg daily for 5 days. Continue oxygen supplementation to maintain pulse oximetry greater than 92% Worsening leukocytosis appears to be secondary to steroid administration.  Patient is afebrile       COPD with acute exacerbation (HCC) - This is clearly the culprit for #1. - Management as above.   Dyslipidemia - Continue statin therapy.   Essential hypertension Continue amlodipine and carvedilol     Anxiety and depression. Continue buspirone, Vraylar, doxepin, trazodone and benztropine            Consultants: Pulmonary  Procedures performed: BiPAP Disposition: Home Diet recommendation:  Discharge Diet Orders (From admission, onward)     Start     Ordered   07/02/23 0000  Diet - low sodium heart healthy        07/02/23 0935           Cardiac diet DISCHARGE MEDICATION: Allergies as of 07/02/2023       Reactions   Lisinopril Cough   Losartan Cough  Other Other (See Comments)   Perfumes : Congestion        Medication List     STOP taking these medications    amoxicillin-clavulanate 875-125 MG tablet Commonly known as: AUGMENTIN   lidocaine 5 % Commonly known as: Lidoderm       TAKE these medications    acetaminophen-codeine 300-30 MG tablet Commonly known as: TYLENOL #3 Take 1 tablet by mouth every 8 (eight) hours as needed for moderate pain (pain score 4-6).   albuterol (2.5 MG/3ML) 0.083% nebulizer  solution Commonly known as: PROVENTIL Take 3 mLs (2.5 mg total) by nebulization every 4 (four) hours as needed for wheezing or shortness of breath.   amLODipine 10 MG tablet Commonly known as: NORVASC Take 1 tablet (10 mg total) by mouth daily.   atorvastatin 40 MG tablet Commonly known as: LIPITOR TAKE 1 TABLET(40 MG) BY MOUTH EVERY MORNING What changed: See the new instructions.   azithromycin 250 MG tablet Commonly known as: ZITHROMAX Take 1 tablet daily until completed   benztropine 1 MG tablet Commonly known as: COGENTIN Take 1 mg by mouth every morning.   budesonide-formoterol 160-4.5 MCG/ACT inhaler Commonly known as: Symbicort Inhale 2 puffs into the lungs 2 (two) times daily.   busPIRone 10 MG tablet Commonly known as: BUSPAR Take 20 mg by mouth 2 (two) times daily.   carvedilol 3.125 MG tablet Commonly known as: COREG Take 1 tablet (3.125 mg total) by mouth 2 (two) times daily with a meal.   doxepin 50 MG capsule Commonly known as: SINEQUAN Take 50 mg by mouth at bedtime.   pantoprazole 40 MG tablet Commonly known as: Protonix Take 1 tablet (40 mg total) by mouth daily.   predniSONE 10 MG tablet Commonly known as: DELTASONE Take 4 tablets (40 mg total) by mouth daily for 1 day, THEN 3.5 tablets (35 mg total) daily for 1 day, THEN 3 tablets (30 mg total) daily for 1 day, THEN 2.5 tablets (25 mg total) daily for 1 day, THEN 2 tablets (20 mg total) daily for 1 day, THEN 1.5 tablets (15 mg total) daily for 1 day, THEN 1 tablet (10 mg total) daily for 1 day, THEN 0.5 tablets (5 mg total) daily for 1 day. Start taking on: July 02, 2023   Vraylar 6 MG Caps Generic drug: Cariprazine HCl Take 1 capsule by mouth daily.               Durable Medical Equipment  (From admission, onward)           Start     Ordered   07/01/23 1518  For home use only DME Other see comment  Once       Comments: "POC eval on 1-5 L pulse dose and dispense if qualifies."   Question:  Length of Need  Answer:  Lifetime   07/01/23 1517            Discharge Exam: There were no vitals filed for this visit.  General exam: awake, alert, no acute distress HEENT: atraumatic, clear conjunctiva, anicteric sclera, moist mucus membranes, hearing grossly normal  Respiratory system: severely diminished breath sounds throughout, no wheezes or rhonchi, normal respiratory effort at rest on 3 L/min [place] O2. Cardiovascular system: normal S1/S2, RRR,  no pedal edema.   Gastrointestinal system: soft, NT, ND, no HSM felt, +bowel sounds. Central nervous system: A&O x3. no gross focal neurologic deficits, normal speech Extremities: moves all, no edema, normal tone Skin: dry, intact, normal temperature  Psychiatry: normal mood, congruent affect, judgement and insight appear normal     Condition at discharge: stable  The results of significant diagnostics from this hospitalization (including imaging, microbiology, ancillary and laboratory) are listed below for reference.   Imaging Studies: CT Angio Chest PE W and/or Wo Contrast  Result Date: 06/30/2023 CLINICAL DATA:  Difficulty breathing EXAM: CT ANGIOGRAPHY CHEST WITH CONTRAST TECHNIQUE: Multidetector CT imaging of the chest was performed using the standard protocol during bolus administration of intravenous contrast. Multiplanar CT image reconstructions and MIPs were obtained to evaluate the vascular anatomy. RADIATION DOSE REDUCTION: This exam was performed according to the departmental dose-optimization program which includes automated exposure control, adjustment of the mA and/or kV according to patient size and/or use of iterative reconstruction technique. CONTRAST:  75mL OMNIPAQUE IOHEXOL 350 MG/ML SOLN COMPARISON:  Plain film from earlier in the same day, 06/19/2023. FINDINGS: Cardiovascular: Thoracic aorta shows normal enhancement pattern. No aneurysmal dilatation or dissection is noted. No cardiac enlargement is seen.  The pulmonary artery shows a normal branching pattern bilaterally. No filling defect to suggest pulmonary embolism is seen. No coronary calcifications are noted. Mediastinum/Nodes: Thoracic inlet is within normal limits. No hilar or mediastinal adenopathy is noted. The esophagus is within normal limits. Lungs/Pleura: Lungs are well aerated bilaterally. No focal infiltrate is seen. Some scarring is noted in the lingula. Upper Abdomen: Visualized upper abdomen shows no acute abnormality. Musculoskeletal: Multiple healing rib fractures are noted on the left. Review of the MIP images confirms the above findings. IMPRESSION: No evidence of pulmonary emboli. Mild scarring in the lingula. Healing left rib fractures. Electronically Signed   By: Alcide Clever M.D.   On: 06/30/2023 01:42   DG Chest 2 View  Result Date: 06/29/2023 CLINICAL DATA:  SOB, increased O2 needs EXAM: CHEST - 2 VIEW COMPARISON:  CT chest 06/19/2023, chest x-ray 06/22/2023 FINDINGS: The heart and mediastinal contours are within normal limits. No focal consolidation. No pulmonary edema. Blunting of the left costophrenic angle with no definite pleural effusion on lateral view. No right pleural effusion. No pneumothorax. No acute osseous abnormality. IMPRESSION: Blunting of the left costophrenic angle with no definite pleural effusion on lateral view. Query scarring. Possible underlying atelectasis Electronically Signed   By: Tish Frederickson M.D.   On: 06/29/2023 21:59   DG CHEST PORT 1 VIEW  Result Date: 06/22/2023 CLINICAL DATA:  Left pleural effusion. EXAM: PORTABLE CHEST 1 VIEW COMPARISON:  Chest x-ray from yesterday. FINDINGS: Unchanged chest tube at the posterior left lung base. The heart size and mediastinal contours are within normal limits. Normal pulmonary vascularity. Similar indistinctness of the left hemidiaphragm and blunting of the left costophrenic angle presumably reflecting residual small pleural effusion and adjacent  atelectasis. Clear right lung. No pneumothorax. No acute osseous abnormality. IMPRESSION: 1. Unchanged small left pleural effusion and adjacent atelectasis with left chest tube in place. Electronically Signed   By: Obie Dredge M.D.   On: 06/22/2023 12:47   ECHOCARDIOGRAM LIMITED  Result Date: 06/21/2023    ECHOCARDIOGRAM REPORT   Patient Name:   Courtney Carrillo Date of Exam: 06/21/2023 Medical Rec #:  [id number]      Height:       65.0 in Accession #:    [id number]     Weight:       230.5 lb Date of Birth:  07-21-1966      BSA:          2.101 m Patient Age:    16 years  BP:           123/94 mmHg Patient Gender: F              HR:           64 bpm. Exam Location:  Inpatient Procedure: Limited Echo, Color Doppler and Cardiac Doppler Indications:    Elevated troponin  History:        Patient has prior history of Echocardiogram examinations, most                 recent 06/03/2023. CHF; Risk Factors:Sleep Apnea, Hypertension                 and Dyslipidemia.  Sonographer:    Milbert Coulter Referring Phys: [id number] RONDELL A SMITH IMPRESSIONS  1. Left ventricular ejection fraction, by estimation, is 60 to 65%. The left ventricle has normal function. The left ventricle has no regional wall motion abnormalities. There is mild left ventricular hypertrophy. Left ventricular diastolic function could not be evaluated.  2. Right ventricular systolic function is normal. The right ventricular size is normal.  3. The mitral valve is normal in structure. No evidence of mitral valve regurgitation.  4. The aortic valve was not assessed. Aortic valve regurgitation is not visualized.  5. The inferior vena cava is normal in size with greater than 50% respiratory variability, suggesting right atrial pressure of 3 mmHg. Comparison(s): No significant change from prior study. 06/03/2023: LVEF 60-65%. FINDINGS  Left Ventricle: Left ventricular ejection fraction, by estimation, is 60 to 65%. The left ventricle has normal function. The  left ventricle has no regional wall motion abnormalities. The left ventricular internal cavity size was normal in size. There is  mild left ventricular hypertrophy. Left ventricular diastolic function could not be evaluated. Right Ventricle: The right ventricular size is normal. No increase in right ventricular wall thickness. Right ventricular systolic function is normal. Left Atrium: Left atrial size was not assessed. Right Atrium: Right atrial size was not assessed. Pericardium: There is no evidence of pericardial effusion. Mitral Valve: The mitral valve is normal in structure. No evidence of mitral valve regurgitation. Tricuspid Valve: The tricuspid valve is grossly normal. Tricuspid valve regurgitation is not demonstrated. Aortic Valve: The aortic valve was not assessed. Aortic valve regurgitation is not visualized. Pulmonic Valve: The pulmonic valve was not assessed. Pulmonic valve regurgitation is not visualized. Aorta: Aortic root could not be assessed. Venous: The inferior vena cava is normal in size with greater than 50% respiratory variability, suggesting right atrial pressure of 3 mmHg. IAS/Shunts: The interatrial septum was not assessed.  LEFT VENTRICLE PLAX 2D LVIDd:         4.20 cm LVIDs:         2.70 cm LV PW:         1.40 cm LV IVS:        1.20 cm LVOT diam:     1.90 cm LVOT Area:     2.84 cm  RIGHT VENTRICLE RV S prime:     13.20 cm/s TAPSE (M-mode): 1.9 cm LEFT ATRIUM         Index LA diam:    2.80 cm 1.33 cm/m   AORTA Ao Root diam: 2.60 cm  SHUNTS Systemic Diam: 1.90 cm Zoila Shutter MD Electronically signed by Zoila Shutter MD Signature Date/Time: 06/21/2023/1:00:23 PM    Final    DG CHEST PORT 1 VIEW  Result Date: 06/21/2023 CLINICAL DATA:  Chest pain. EXAM: PORTABLE CHEST 1 VIEW COMPARISON:  June 20, 2023 FINDINGS: The heart size and mediastinal contours are within normal limits. A left-sided chest tube is again seen with its distal tip overlying the medial aspect of the left lung  base. Mild atelectasis is seen within the bilateral lung bases. A small, stable left pleural effusion is noted. No pneumothorax is identified. A nondisplaced lateral sixth left rib fracture is seen. The visualized skeletal structures are unremarkable. IMPRESSION: 1. Left-sided chest tube positioning, as described above. 2. Mild bibasilar atelectasis. 3. Small left pleural effusion. Electronically Signed   By: Aram Candela M.D.   On: 06/21/2023 03:30   DG CHEST PORT 1 VIEW  Result Date: 06/20/2023 CLINICAL DATA:  Chest tube EXAM: PORTABLE CHEST 1 VIEW COMPARISON:  Chest x-ray 06/19/2023.  Chest CT 06/19/2023. FINDINGS: New left-sided chest tube is seen in the lower left hemithorax. There is a small left pleural effusion, minimally decreased from prior. No pneumothorax. No new focal lung infiltrate. The cardiomediastinal silhouette is stable. The osseous structures are unchanged. IMPRESSION: New left-sided chest tube. Mild decrease in small left pleural effusion. Electronically Signed   By: Darliss Cheney M.D.   On: 06/20/2023 20:12   CT Angio Chest PE W and/or Wo Contrast  Result Date: 06/19/2023 CLINICAL DATA:  Pulmonary embolism (PE) suspected, high prob Shortness of breath. EXAM: CT ANGIOGRAPHY CHEST WITH CONTRAST TECHNIQUE: Multidetector CT imaging of the chest was performed using the standard protocol during bolus administration of intravenous contrast. Multiplanar CT image reconstructions and MIPs were obtained to evaluate the vascular anatomy. RADIATION DOSE REDUCTION: This exam was performed according to the departmental dose-optimization program which includes automated exposure control, adjustment of the mA and/or kV according to patient size and/or use of iterative reconstruction technique. CONTRAST:  75mL OMNIPAQUE IOHEXOL 350 MG/ML SOLN COMPARISON:  Chest CTA 06/02/2023 FINDINGS: Cardiovascular: There are no filling defects within the pulmonary arteries to suggest pulmonary embolus. Breathing  motion artifact limits assessment of the mid lower lung zones. Aortic atherosclerosis without acute aortic findings. Heart is normal in size. No pericardial effusion. Mediastinum/Nodes: No enlarged mediastinal or hilar lymph nodes. No visible thyroid nodule. Patulous esophagus without wall thickening. Lungs/Pleura: Left pleural effusion has diminished from prior exam, however a moderate volume of pleural fluid persists. This is partially loculated anteriorly. Adjacent compressive atelectasis in the lower lobe. No right pleural effusion. No acute airspace disease. No endobronchial lesion. Choose of pulmonary edema. Upper Abdomen: No acute findings. Musculoskeletal: Healing left lateral fifth and sixth rib fractures with surrounding callus, increased callus formation from prior. No acute osseous findings. Review of the MIP images confirms the above findings. IMPRESSION: 1. No pulmonary embolus. Breathing motion artifact limits assessment of the mid lower lung zones. 2. Left pleural effusion has diminished from prior exam, however a moderate volume of pleural fluid persists. This is partially loculated anteriorly. Adjacent compressive atelectasis in the lower lobe. 3. Healing left lateral fifth and sixth rib fractures. Aortic Atherosclerosis (ICD10-I70.0). Electronically Signed   By: Narda Rutherford M.D.   On: 06/19/2023 21:16   DG Chest 2 View  Result Date: 06/19/2023 CLINICAL DATA:  Provided history: Shortness of breath. Patient on home oxygen since suffering a rib fracture in September. Dyspnea. EXAM: CHEST - 2 VIEW COMPARISON:  Prior chest radiographs 06/03/2023 and earlier. Chest CT 06/02/2023. FINDINGS: Heart size within normal limits. Small residual left pleural effusion, decreased from the prior examination of 06/03/2023. Persistent mild ill-defined opacity at the lateral left lung base. No appreciable airspace consolidation on the right. No evidence  of pneumothorax. Known non-acute fractures of the left  fifth and sixth ribs, better appreciated on the prior chest CT of 06/02/2023. Degenerative changes of the spine. IMPRESSION: 1. Small residual left pleural effusion, decreased from the prior examination of 06/03/2023. 2. Persistent mild ill-defined opacity at the lateral left lung base. While this could sign 4 reflect atelectasis, pneumonia cannot be excluded. Electronically Signed   By: Jackey Loge D.O.   On: 06/19/2023 12:53   ECHOCARDIOGRAM COMPLETE  Result Date: 06/03/2023    ECHOCARDIOGRAM REPORT   Patient Name:   ALIYHA REEVER Date of Exam: 06/03/2023 Medical Rec #:  [id number]      Height:       64.2 in Accession #:    [id number]     Weight:       229.5 lb Date of Birth:  11-Jul-1966      BSA:          2.078 m Patient Age:    57 years       BP:           139/91 mmHg Patient Gender: F              HR:           79 bpm. Exam Location:  [facility] Procedure: 2D Echo, Cardiac Doppler and Color Doppler Indications:     CHF  History:         Patient has no prior history of Echocardiogram examinations.                  CHF; Risk Factors:Sleep Apnea, Hypertension and Dyslipidemia.  Sonographer:     Mikki Harbor Referring Phys:  [id number] HYQM ALESKEROV Diagnosing Phys: Julien Nordmann MD  Sonographer Comments: Patient is obese. IMPRESSIONS  1. Left ventricular ejection fraction, by estimation, is 60 to 65%. The left ventricle has normal function. The left ventricle has no regional wall motion abnormalities. Left ventricular diastolic parameters were normal.  2. Right ventricular systolic function is normal. The right ventricular size is normal. Tricuspid regurgitation signal is inadequate for assessing PA pressure.  3. The mitral valve is normal in structure. No evidence of mitral valve regurgitation. No evidence of mitral stenosis.  4. The aortic valve is normal in structure. Aortic valve regurgitation is not visualized. Aortic valve sclerosis is present, with no evidence of aortic valve stenosis.  5. The inferior  vena cava is normal in size with greater than 50% respiratory variability, suggesting right atrial pressure of 3 mmHg. FINDINGS  Left Ventricle: Left ventricular ejection fraction, by estimation, is 60 to 65%. The left ventricle has normal function. The left ventricle has no regional wall motion abnormalities. The left ventricular internal cavity size was normal in size. There is  no left ventricular hypertrophy. Left ventricular diastolic parameters were normal. Right Ventricle: The right ventricular size is normal. No increase in right ventricular wall thickness. Right ventricular systolic function is normal. Tricuspid regurgitation signal is inadequate for assessing PA pressure. Left Atrium: Left atrial size was normal in size. Right Atrium: Right atrial size was normal in size. Pericardium: There is no evidence of pericardial effusion. Mitral Valve: The mitral valve is normal in structure. No evidence of mitral valve regurgitation. No evidence of mitral valve stenosis. MV peak gradient, 4.4 mmHg. The mean mitral valve gradient is 2.0 mmHg. Tricuspid Valve: The tricuspid valve is normal in structure. Tricuspid valve regurgitation is mild . No evidence of tricuspid stenosis. Aortic Valve: The aortic valve is normal  in structure. Aortic valve regurgitation is not visualized. Aortic valve sclerosis is present, with no evidence of aortic valve stenosis. Aortic valve mean gradient measures 6.0 mmHg. Aortic valve peak gradient measures 13.5 mmHg. Aortic valve area, by VTI measures 2.80 cm. Pulmonic Valve: The pulmonic valve was normal in structure. Pulmonic valve regurgitation is not visualized. No evidence of pulmonic stenosis. Aorta: The aortic root is normal in size and structure. Venous: The inferior vena cava is normal in size with greater than 50% respiratory variability, suggesting right atrial pressure of 3 mmHg. IAS/Shunts: No atrial level shunt detected by color flow Doppler.  LEFT VENTRICLE PLAX 2D LVIDd:          4.50 cm   Diastology LVIDs:         2.30 cm   LV e' medial:    9.14 cm/s LV PW:         1.00 cm   LV E/e' medial:  10.5 LV IVS:        1.20 cm   LV e' lateral:   10.80 cm/s LVOT diam:     2.10 cm   LV E/e' lateral: 8.9 LV SV:         95 LV SV Index:   46 LVOT Area:     3.46 cm  RIGHT VENTRICLE RV Basal diam:  2.80 cm RV Mid diam:    2.90 cm RV S prime:     17.60 cm/s TAPSE (M-mode): 2.9 cm LEFT ATRIUM             Index        RIGHT ATRIUM           Index LA diam:        3.60 cm 1.73 cm/m   RA Area:     17.00 cm LA Vol (A2C):   49.7 ml 23.92 ml/m  RA Volume:   41.30 ml  19.88 ml/m LA Vol (A4C):   36.1 ml 17.37 ml/m LA Biplane Vol: 41.2 ml 19.83 ml/m  AORTIC VALVE                     PULMONIC VALVE AV Area (Vmax):    2.82 cm      PV Vmax:       1.31 m/s AV Area (Vmean):   2.65 cm      PV Peak grad:  6.9 mmHg AV Area (VTI):     2.80 cm AV Vmax:           184.00 cm/s AV Vmean:          113.000 cm/s AV VTI:            0.338 m AV Peak Grad:      13.5 mmHg AV Mean Grad:      6.0 mmHg LVOT Vmax:         150.00 cm/s LVOT Vmean:        86.500 cm/s LVOT VTI:          0.273 m LVOT/AV VTI ratio: 0.81  AORTA Ao Root diam: 3.00 cm MITRAL VALVE MV Area (PHT): 3.01 cm    SHUNTS MV Area VTI:   3.33 cm    Systemic VTI:  0.27 m MV Peak grad:  4.4 mmHg    Systemic Diam: 2.10 cm MV Mean grad:  2.0 mmHg MV Vmax:       1.05 m/s MV Vmean:      67.4 cm/s MV Decel Time: 252 msec MV  E velocity: 95.70 cm/s MV A velocity: 94.80 cm/s MV E/A ratio:  1.01 Julien Nordmann MD Electronically signed by Julien Nordmann MD Signature Date/Time: 06/03/2023/6:02:42 PM    Final    US THORACENTESIS ASP PLEURAL SPACE W/IMG GUIDE  Result Date: 06/03/2023 INDICATION: Recurrent left pleural effusion. Request received for diagnostic and therapeutic thoracentesis EXAM: ULTRASOUND GUIDED  THORACENTESIS MEDICATIONS: 8 cc 1% lidocaine COMPLICATIONS: None immediate. PROCEDURE: An ultrasound guided thoracentesis was thoroughly discussed with the  patient and questions answered. The benefits, risks, alternatives and complications were also discussed. The patient understands and wishes to proceed with the procedure. Written consent was obtained. Ultrasound was performed to localize and mark an adequate pocket of fluid in the left chest. The area was then prepped and draped in the normal sterile fashion. 1% Lidocaine was used for local anesthesia. Under ultrasound guidance a 6 Fr Safe-T-Centesis catheter was introduced. Thoracentesis was performed. The catheter was removed and a dressing applied. FINDINGS: A total of approximately 1 L of blood-tinged pleural fluid fluid was removed. Samples were sent to the laboratory as requested by the clinical team. IMPRESSION: Successful ultrasound guided left thoracentesis yielding 1 L of pleural fluid. Follow-up chest x-ray revealed no evidence of pneumothorax. Procedure performed by Mina Marble, PA-C Electronically Signed   By: Olive Bass M.D.   On: 06/03/2023 16:05   DG Chest Port 1 View  Result Date: 06/03/2023 CLINICAL DATA:  Pleural effusion, status post thoracentesis EXAM: PORTABLE CHEST 1 VIEW COMPARISON:  06/02/2023 FINDINGS: Cardiomegaly. Diminished volume of a layering left pleural effusion, now small. No significant pneumothorax. Right lung normally aerated. No acute osseous findings. IMPRESSION: 1. Diminished volume of a layering left pleural effusion, now small. No significant pneumothorax. 2. Cardiomegaly. Electronically Signed   By: Jearld Lesch M.D.   On: 06/03/2023 14:36   CT Angio Chest PE W and/or Wo Contrast  Result Date: 06/02/2023 CLINICAL DATA:  Shortness of breath EXAM: CT ANGIOGRAPHY CHEST WITH CONTRAST TECHNIQUE: Multidetector CT imaging of the chest was performed using the standard protocol during bolus administration of intravenous contrast. Multiplanar CT image reconstructions and MIPs were obtained to evaluate the vascular anatomy. RADIATION DOSE REDUCTION: This exam was  performed according to the departmental dose-optimization program which includes automated exposure control, adjustment of the mA and/or kV according to patient size and/or use of iterative reconstruction technique. CONTRAST:  75mL OMNIPAQUE IOHEXOL 350 MG/ML SOLN COMPARISON:  Chest radiographs dated 06/02/2023, 06/01/2023, CT chest dated 05/08/2023 FINDINGS: Cardiovascular: The study is high quality for the evaluation of pulmonary embolism. There are no filling defects in the central, lobar, segmental or subsegmental pulmonary artery branches to suggest acute pulmonary embolism. Great vessels are normal in course and caliber. Left ventricle appears effaced by the adjacent pleural effusion. No significant pericardial fluid/thickening. Aortic atherosclerosis. Mediastinum/Nodes: Imaged thyroid gland without nodules meeting criteria for imaging follow-up by size. Normal esophagus. No pathologically enlarged axillary, supraclavicular, mediastinal, or hilar lymph nodes. Lungs/Pleura: The central airways are patent. Subsegmental lingular and left lower lobe atelectasis. No focal consolidation. No pneumothorax. Persistent moderate to large left pleural effusion with loculated components. Upper abdomen: 1.2 cm left upper pole hypoattenuating focus (4:151), likely simple cysts. No specific follow-up imaging recommended. Partially imaged lower pole left kidney demonstrates prominent contour anteriorly (4:164). Musculoskeletal: Healing left anterolateral fifth and sixth rib fractures. Review of the MIP images confirms the above findings. IMPRESSION: 1. No evidence of acute pulmonary embolism. 2. Persistent moderate to large left pleural effusion with loculated components. The left  ventricle appears effaced by this pleural effusion. 3. Healing left anterolateral fifth and sixth rib fractures. 4. Partially imaged lower pole left kidney demonstrates prominent contour anteriorly. Recommend further evaluation with renal ultrasound.  5.  Aortic Atherosclerosis (ICD10-I70.0). Electronically Signed   By: Agustin Cree M.D.   On: 06/02/2023 17:36   DG Chest 2 View  Result Date: 06/02/2023 CLINICAL DATA:  Shortness of breath. EXAM: CHEST - 2 VIEW COMPARISON:  X-ray 06/01/2023 FINDINGS: Stable moderate left effusion with some adjacent opacity. No pneumothorax or edema. Normal cardiopericardial silhouette. Degenerative changes along the spine. IMPRESSION: Moderate left effusion. Similar to previous. Please correlate with the history Electronically Signed   By: Karen Kays M.D.   On: 06/02/2023 13:41    Microbiology: Results for orders placed or performed during the hospital encounter of 06/29/23  Respiratory (~20 pathogens) panel by PCR     Status: None   Collection Time: 07/01/23  5:10 PM   Specimen: Nasopharyngeal Swab; Respiratory  Result Value Ref Range Status   Adenovirus NOT DETECTED NOT DETECTED Final   Coronavirus 229E NOT DETECTED NOT DETECTED Final    Comment: (NOTE) The Coronavirus on the Respiratory Panel, DOES NOT test for the novel  Coronavirus (2019 nCoV)    Coronavirus HKU1 NOT DETECTED NOT DETECTED Final   Coronavirus NL63 NOT DETECTED NOT DETECTED Final   Coronavirus OC43 NOT DETECTED NOT DETECTED Final   Metapneumovirus NOT DETECTED NOT DETECTED Final   Rhinovirus / Enterovirus NOT DETECTED NOT DETECTED Final   Influenza A NOT DETECTED NOT DETECTED Final   Influenza B NOT DETECTED NOT DETECTED Final   Parainfluenza Virus 1 NOT DETECTED NOT DETECTED Final   Parainfluenza Virus 2 NOT DETECTED NOT DETECTED Final   Parainfluenza Virus 3 NOT DETECTED NOT DETECTED Final   Parainfluenza Virus 4 NOT DETECTED NOT DETECTED Final   Respiratory Syncytial Virus NOT DETECTED NOT DETECTED Final   Bordetella pertussis NOT DETECTED NOT DETECTED Final   Bordetella Parapertussis NOT DETECTED NOT DETECTED Final   Chlamydophila pneumoniae NOT DETECTED NOT DETECTED Final   Mycoplasma pneumoniae NOT DETECTED NOT DETECTED  Final    Comment: Performed at [facility], [address] [place], [place] [address]  SARS Coronavirus 2 by RT PCR (hospital order, performed in [facility] hospital lab) *cepheid single result test* Anterior Nasal Swab     Status: None   Collection Time: 07/01/23  5:10 PM   Specimen: Anterior Nasal Swab  Result Value Ref Range Status   SARS Coronavirus 2 by RT PCR NEGATIVE NEGATIVE Final    Comment: (NOTE) SARS-CoV-2 target nucleic acids are NOT DETECTED.  The SARS-CoV-2 RNA is generally detectable in upper and lower respiratory specimens during the acute phase of infection. The lowest concentration of SARS-CoV-2 viral copies this assay can detect is 250 copies / mL. A negative result does not preclude SARS-CoV-2 infection and should not be used as the sole basis for treatment or other patient management decisions.  A negative result may occur with improper specimen collection / handling, submission of specimen other than nasopharyngeal swab, presence of viral mutation(s) within the areas targeted by this assay, and inadequate number of viral copies (<250 copies / mL). A negative result must be combined with clinical observations, patient history, and epidemiological information.  Fact Sheet for Patients:   RoadLapTop.co.za  Fact Sheet for Healthcare Providers: http://kim-miller.com/  This test is not yet approved or  cleared by the [place] FDA and has been authorized for detection  and/or diagnosis of SARS-CoV-2 by FDA under an Emergency Use Authorization (EUA).  This EUA will remain in effect (meaning this test can be used) for the duration of the COVID-19 declaration under Section 564(b)(1) of the Act, 21 U.S.C. section 360bbb-3(b)(1), unless the authorization is terminated or revoked sooner.  Performed at [facility], [address] [place], [place] [address]   MRSA Next Gen by PCR, Nasal     Status:  None   Collection Time: 07/01/23  5:10 PM   Specimen: Anterior Nasal Swab  Result Value Ref Range Status   MRSA by PCR Next Gen NOT DETECTED NOT DETECTED Final    Comment: (NOTE) The GeneXpert MRSA Assay (FDA approved for NASAL specimens only), is one component of a comprehensive MRSA colonization surveillance program. It is not intended to diagnose MRSA infection nor to guide or monitor treatment for MRSA infections. Test performance is not FDA approved in patients less than 43 years old. Performed at [facility], [address] [place], [place] [address]     Labs: CBC: Recent Labs  Lab 06/29/23 1908 06/30/23 0539 07/01/23 0407  WBC 18.8* 17.5* 22.5*  HGB 11.7* 11.1* 9.8*  HCT 38.3 36.2 31.1*  MCV 77.8* 77.5* 74.4*  PLT 421* 375 402*   Basic Metabolic Panel: Recent Labs  Lab 06/29/23 1908 06/30/23 0539  NA 140 137  K 2.9* 4.3  CL 102 101  CO2 29 27  GLUCOSE 118* 181*  BUN 14 13  CREATININE 0.99 0.88  CALCIUM 9.3 9.2   Liver Function Tests: Recent Labs  Lab 06/29/23 1908  AST 17  ALT 20  ALKPHOS 66  BILITOT 0.5  PROT 8.1  ALBUMIN 4.4   CBG: No results for input(s): "GLUCAP" in the last 168 hours.  Discharge time spent: greater than 30 minutes.  Signed: Lucile Shutters, MD [facility] 07/02/2023

## 2023-07-03 ENCOUNTER — Telehealth: Payer: Self-pay

## 2023-07-03 NOTE — Transitions of Care (Post Inpatient/ED Visit) (Signed)
07/03/2023  Name: Courtney Carrillo MRN: [id number] DOB: 09-08-1965  Today's TOC FU Call Status: Today's TOC FU Call Status:: Successful TOC FU Call Completed TOC FU Call Complete Date: 07/03/23 Patient's Name and Date of Birth confirmed.  Transition Care Management Follow-up Telephone Call Date of Discharge: 07/02/23 Discharge Facility: [facility]) Type of Discharge: Inpatient Admission Primary Inpatient Discharge Diagnosis:: Acute Respiratory Failure How have you been since you were released from the hospital?: Better Any questions or concerns?: Yes Patient Questions/Concerns:: The patient would like portable oxygen Patient Questions/Concerns Addressed: Notified Provider of Patient Questions/Concerns  Items Reviewed: Did you receive and understand the discharge instructions provided?: Yes Medications obtained,verified, and reconciled?: Yes (Medications Reviewed) Any new allergies since your discharge?: No Dietary orders reviewed?: NA Do you have support at home?: Yes People in Home: child(ren), adult Name of Support/Comfort Primary Source: Courtney Carrillo  Medications Reviewed Today: Medications Reviewed Today     Reviewed by Redge Gainer, RN (Case Manager) on 07/03/23 at 1333  Med List Status: <None>   Medication Order Taking? Sig Documenting Provider Last Dose Status Informant  acetaminophen-codeine (TYLENOL #3) 300-30 MG tablet [id number]  Take 1 tablet by mouth every 8 (eight) hours as needed for moderate pain (pain score 4-6). Lorre Munroe, NP  Active Self, Child  albuterol (PROVENTIL) (2.5 MG/3ML) 0.083% nebulizer solution [id number]  Take 3 mLs (2.5 mg total) by nebulization every 4 (four) hours as needed for wheezing or shortness of breath. Tommi Rumps, PA-C  Active Self, Child           Med Note (CARD, AMY L   Sun Jun 21, 2023  1:23 PM) Patient was given a script for a nebulizer.  amLODipine (NORVASC) 10 MG tablet [id number]  Take 1  tablet (10 mg total) by mouth daily. Lucile Shutters, MD  Active Self, Child  atorvastatin (LIPITOR) 40 MG tablet [id number]  TAKE 1 TABLET(40 MG) BY MOUTH EVERY MORNING  Patient taking differently: Take 40 mg by mouth daily.   Lorre Munroe, NP  Active Self, Child  azithromycin (ZITHROMAX) 250 MG tablet [id number] No Take 1 tablet daily until completed  Patient not taking: Reported on 07/03/2023   Lucile Shutters, MD Not Taking Active   benztropine (COGENTIN) 1 MG tablet [id number]  Take 1 mg by mouth every morning. [provider]  Active Self, Child  budesonide-formoterol (SYMBICORT) 160-4.5 MCG/ACT inhaler [id number]  Inhale 2 puffs into the lungs 2 (two) times daily. Lucile Shutters, MD  Active Self, Child  busPIRone (BUSPAR) 10 MG tablet [id number]  Take 20 mg by mouth 2 (two) times daily. [provider]  Active Self, Child  carvedilol (COREG) 3.125 MG tablet [id number]  Take 1 tablet (3.125 mg total) by mouth 2 (two) times daily with a meal. Agbata, Tochukwu, MD  Active Self, Child  doxepin (SINEQUAN) 50 MG capsule [id number]  Take 50 mg by mouth at bedtime. [provider]  Active Self, Child  pantoprazole (PROTONIX) 40 MG tablet [id number]  Take 1 tablet (40 mg total) by mouth daily. Lucile Shutters, MD  Active   predniSONE (DELTASONE) 10 MG tablet [id number]  Take 4 tablets (40 mg total) by mouth daily for 1 day, THEN 3.5 tablets (35 mg total) daily for 1 day, THEN 3 tablets (30 mg total) daily for 1 day, THEN 2.5 tablets (25 mg total) daily for 1 day, THEN 2 tablets (20 mg total) daily for 1 day, THEN 1.5 tablets (15 mg total) daily for  1 day, THEN 1 tablet (10 mg total) daily for 1 day, THEN 0.5 tablets (5 mg total) daily for 1 day. Lucile Shutters, MD  Active   VRAYLAR 6 MG CAPS [id number]  Take 1 capsule by mouth daily. [provider]  Active Self, Child            Home Care and Equipment/Supplies: Were Home Health Services Ordered?: NA Any new  equipment or medical supplies ordered?: Yes Name of Medical supply agency?: [employer] Were you able to get the equipment/medical supplies?: Yes Do you have any questions related to the use of the equipment/supplies?: Yes What questions do you have?: The patient would like portable oxygen  Functional Questionnaire: Do you need assistance with bathing/showering or dressing?: No Do you need assistance with meal preparation?: No Do you need assistance with eating?: No Do you have difficulty maintaining continence: No Do you need assistance with getting out of bed/getting out of a chair/moving?: No Do you have difficulty managing or taking your medications?: No  Follow up appointments reviewed: PCP Follow-up appointment confirmed?: No MD Provider Line Number:[phone] Given: Yes Specialist Hospital Follow-up appointment confirmed?: Yes Date of Specialist follow-up appointment?: 07/07/23 Follow-Up Specialty Provider:: Pulmonolgy at [facility] Do you need transportation to your follow-up appointment?: No Do you understand care options if your condition(s) worsen?: Yes-patient verbalized understanding  SDOH Interventions Today    Flowsheet Row Most Recent Value  SDOH Interventions   Food Insecurity Interventions Intervention Not Indicated  Housing Interventions Intervention Not Indicated  Transportation Interventions Intervention Not Indicated  Utilities Interventions Intervention Not Indicated      TOC Outreach completed. The patient has been hospitalized twice in the last three [place] for SOB and COPD exacerbation. She feels that it related to her fall with broken ribs that she sustained a couple of [place] ago. She is now on Oxygen 2L/min continuous. Her only concern today is she would like a portable Oxygen tank. Recommendation sent to the provider. The patient uses [employer] for DME. Medications reviewed. ABX have been stopped since she does not have an infection. Reviewed Prednisone taper and side  effects from steroids. Reminded patient to check her medication for refills prior to the four day holiday weekend. She has a follow up appointment with [facility] on November 26th. She states she will call her PCP after that to schedule a hospital follow up. She declined RNCM offer to schedule today. The patient is currently enrolled with CCM. TOC to follow up next week and then transfer to Longitudinal team. Patient educated on red flags s/s to watch for and was encouraged to report any of these identified, any new symptoms, changes in baseline or medication regimen, change in health status / well-being, or safety concerns to PCP and / or the [facility] Case Management team.  The patient has been provided with contact information for the care management team and has been advised to call with any health-related questions or concerns. The patient verbalized understanding with current POC. The patient is directed to their insurance card regarding availability of benefits coverage.  Deidre Ala, RN Medical illustrator [facility] [phone]

## 2023-07-03 NOTE — Transitions of Care (Post Inpatient/ED Visit) (Signed)
   07/03/2023  Name: LYNDSEA HINEBAUGH MRN: [id number] DOB: March 16, 1966  Message from the provider stating that the request for the portable oxygen would need to come from the [facility]. Contacted the patient and relayed the above information. The patient voiced understanding.   Deidre Ala, RN Medical illustrator [facility] [phone]

## 2023-07-07 DIAGNOSIS — J90 Pleural effusion, not elsewhere classified: Secondary | ICD-10-CM | POA: Diagnosis not present

## 2023-07-07 DIAGNOSIS — R918 Other nonspecific abnormal finding of lung field: Secondary | ICD-10-CM | POA: Diagnosis not present

## 2023-07-07 DIAGNOSIS — G4733 Obstructive sleep apnea (adult) (pediatric): Secondary | ICD-10-CM | POA: Diagnosis not present

## 2023-07-10 ENCOUNTER — Emergency Department: Payer: 59

## 2023-07-10 ENCOUNTER — Other Ambulatory Visit: Payer: Self-pay

## 2023-07-10 ENCOUNTER — Emergency Department
Admission: EM | Admit: 2023-07-10 | Discharge: 2023-07-10 | Disposition: A | Discharge status: Home / Self Care | Payer: 59 | Attending: Emergency Medicine | Admitting: Emergency Medicine

## 2023-07-10 DIAGNOSIS — R0689 Other abnormalities of breathing: Secondary | ICD-10-CM | POA: Diagnosis not present

## 2023-07-10 DIAGNOSIS — Z743 Need for continuous supervision: Secondary | ICD-10-CM | POA: Diagnosis not present

## 2023-07-10 DIAGNOSIS — R0602 Shortness of breath: Secondary | ICD-10-CM | POA: Diagnosis not present

## 2023-07-10 DIAGNOSIS — J9622 Acute and chronic respiratory failure with hypercapnia: Secondary | ICD-10-CM | POA: Diagnosis not present

## 2023-07-10 DIAGNOSIS — I10 Essential (primary) hypertension: Secondary | ICD-10-CM | POA: Diagnosis not present

## 2023-07-10 DIAGNOSIS — J441 Chronic obstructive pulmonary disease with (acute) exacerbation: Secondary | ICD-10-CM | POA: Diagnosis not present

## 2023-07-10 DIAGNOSIS — J90 Pleural effusion, not elsewhere classified: Secondary | ICD-10-CM | POA: Diagnosis not present

## 2023-07-10 DIAGNOSIS — R0600 Dyspnea, unspecified: Secondary | ICD-10-CM | POA: Diagnosis not present

## 2023-07-10 DIAGNOSIS — J9621 Acute and chronic respiratory failure with hypoxia: Secondary | ICD-10-CM | POA: Diagnosis not present

## 2023-07-10 DIAGNOSIS — R6889 Other general symptoms and signs: Secondary | ICD-10-CM | POA: Diagnosis not present

## 2023-07-10 DIAGNOSIS — I499 Cardiac arrhythmia, unspecified: Secondary | ICD-10-CM | POA: Diagnosis not present

## 2023-07-10 HISTORY — DX: Chronic obstructive pulmonary disease, unspecified: J44.9

## 2023-07-10 LAB — BASIC METABOLIC PANEL
Anion gap: 11 (ref 5–15)
BUN: 6 mg/dL (ref 6–20)
CO2: 26 mmol/L (ref 22–32)
Calcium: 9.1 mg/dL (ref 8.9–10.3)
Chloride: 101 mmol/L (ref 98–111)
Creatinine, Ser: 0.72 mg/dL (ref 0.44–1.00)
GFR, Estimated: >60 mL/min (ref >60)
Glucose, Bld: 143 mg/dL — ABNORMAL HIGH (ref 70–99)
Potassium: 3.6 mmol/L (ref 3.5–5.1)
Sodium: 138 mmol/L (ref 135–145)

## 2023-07-10 LAB — CBC
HCT: 42.9 % (ref 36.0–46.0)
Hemoglobin: 12.9 g/dL (ref 12.0–15.0)
MCH: 23.5 pg — ABNORMAL LOW (ref 26.0–34.0)
MCHC: 30.1 g/dL (ref 30.0–36.0)
MCV: 78 fL — ABNORMAL LOW (ref 80.0–100.0)
Platelets: 427 10*3/uL — ABNORMAL HIGH (ref 150–400)
RBC: 5.5 MIL/uL — ABNORMAL HIGH (ref 3.87–5.11)
RDW: 18.1 % — ABNORMAL HIGH (ref 11.5–15.5)
WBC: 14.2 10*3/uL — ABNORMAL HIGH (ref 4.0–10.5)
nRBC: 0 % (ref 0.0–0.2)

## 2023-07-10 LAB — BRAIN NATRIURETIC PEPTIDE: B Natriuretic Peptide: 26.7 pg/mL (ref 0.0–100.0)

## 2023-07-10 LAB — TROPONIN I (HIGH SENSITIVITY): Troponin I (High Sensitivity): 6 ng/L (ref <18)

## 2023-07-10 LAB — PROCALCITONIN: Procalcitonin: <0.1 ng/mL

## 2023-07-10 MED ORDER — PREDNISONE 20 MG PO TABS
60.0000 mg | ORAL_TABLET | Freq: Once | ORAL | Status: AC
  Administered 2023-07-10: 60 mg via ORAL
  Filled 2023-07-10: qty 3

## 2023-07-10 MED ORDER — PREDNISONE 10 MG PO TABS: 40.0000 mg | ORAL_TABLET | Freq: Every day | ORAL | 0 refills | Status: AC

## 2023-07-10 MED ORDER — IPRATROPIUM-ALBUTEROL 0.5-2.5 (3) MG/3ML IN SOLN
3.0000 mL | Freq: Once | RESPIRATORY_TRACT | Status: AC
  Administered 2023-07-10: 3 mL via RESPIRATORY_TRACT
  Filled 2023-07-10: qty 3

## 2023-07-10 NOTE — Patient Outreach (Signed)
Care Management  Transitions of Care Program Transitions of Care Post-discharge week 4   07/10/2023 Name: Courtney Carrillo MRN: [id number] DOB: 1965-09-14  Subjective: Courtney Carrillo is a 57 y.o. year old female who is a primary care patient of Lorre Munroe, NP. The Care Management team Engaged with patient Engaged with patient by telephone to assess and address transitions of care needs.   Consent to Services:  Patient was given information about care management services, agreed to services, and gave verbal consent to participate.   Assessment: Outreach to the patient today. She states her breathing is not as good today and she is using her inhaler and nebulizer more often. She states she has to go to work because she got a new car and she has a Pharmacist, community. She works at [employer] and has opportunities for rest breaks. Encourage the patient to take a neb treatment prior to work and to take her rescue inhaler. She continues on 2l/min of oxygen and her O2 sat is 97%. She states the [employer] sent in a prescription for portable oxygen to [facility]. RNCM contacted [employer] at [phone] and the oxygen is out for delivery today. Contacted the patient to inform. The patient is also not taking her Prednisone taper correctl. She started out with 30 pills on 07/02/23 and she still has 18 pills left and has not taken one in the last two days. Message sent to the provider. Discussed going to the [facility] of ED if she feels she can't breathe   SDOH Interventions    Flowsheet Row Telephone from 07/03/2023 in [place] [facility]ATION [facility]ALTH DEPARTMENT Telephone from 06/24/2023 in [place] POP[facility] DEPARTMENT Admission (Discharged) from 06/20/2023 in [facility] Patient Outreach from 06/11/2023 in [place] POPULATION HEALTH DEPARTMENT Office Visit from 01/28/2023 in [facility] Clinical Support from 12/25/2022 in [facility]  SDOH Interventions        Food Insecurity Interventions Intervention Not Indicated Intervention Not Indicated -- -- -- Intervention Not Indicated  Housing Interventions Intervention Not Indicated -- -- -- -- Intervention Not Indicated  Transportation Interventions Intervention Not Indicated Intervention Not Indicated Intervention Not Indicated, Inpatient TOC, Patient Resources (Friends/Family) -- -- Intervention Not Indicated  Utilities Interventions Intervention Not Indicated -- -- -- -- Intervention Not Indicated  Alcohol Usage Interventions -- -- -- -- -- Intervention Not Indicated (Score <7)  Depression Interventions/Treatment  -- -- -- -- Currently on Treatment --  Financial Strain Interventions -- -- -- -- -- Intervention Not Indicated  Physical Activity Interventions -- -- -- Intervention Not Indicated  [limited by breathing] -- Patient Declined  Stress Interventions -- -- -- -- -- Intervention Not Indicated  Social Connections Interventions -- -- -- Intervention Not Indicated -- Intervention Not Indicated  Health Literacy Interventions -- -- -- Intervention Not Indicated -- --        Goals Addressed             This Visit's Progress    COMPLETED: TOC Care Plan       Current Barriers:  Knowledge Deficits related to plan of care for management of HTN, COPD, and Pleural Effusion  Chronic Disease Management support and education needs related to COPD   RNCM Clinical Goal(s):  Patient will work with the Care Management team over the next 30 days to address Transition of Care Barriers: Medication Management Diet/Nutrition/Food Resources Support at home Provider appointments Equipment/DME verbalize basic  understanding of  COPD disease process and self health management plan as evidenced by no ED visits in the next 30 days  through collaboration with RN Care manager, provider, and care team.   Interventions: Evaluation of current treatment plan related to  self  management and patient's adherence to plan as established by provider   Hypertension Interventions:  (Status:  New goal.) Short Term Goal Last practice recorded BP readings:  BP Readings from Last 3 Encounters:  06/23/23 (!) 143/105  06/20/23 (!) 144/90  06/19/23 125/84   Most recent eGFR/CrCl:  Lab Results  Component Value Date   EGFR 87 01/28/2023    No components found for: "CRCL"  Evaluation of current treatment plan related to hypertension self management and patient's adherence to plan as established by provider Reviewed medications with patient and discussed importance of compliance Assessed social determinant of health barriers  Patient Goals/Self-Care Activities: Participate in Transition of Care Program/Attend [facility] scheduled calls Notify RN Care Manager of TOC call rescheduling needs Take all medications as prescribed Attend all scheduled provider appointments  Follow Up Plan:  Telephone follow up appointment with care management team member scheduled for:  December 6 with [facility]         Plan: Transfer the patient to Longitudinal Care Management Team. The patient has been provided with contact information for the care management team and has been advised to call with any health-related questions or concerns. The patient verbalized understanding with current POC. The patient is directed to their insurance card regarding availability of benefits coverage.  Deidre Ala, RN Medical illustrator [facility] [phone]

## 2023-07-10 NOTE — ED Triage Notes (Signed)
Pt to ED via [facility] with complaints of shortness of breath and difficulty breathing. Pt has hx of COPD and HTN. EMS reports pt has anxious in route. Pt sitting on stretch talking in full sentences at this time. EMS reports a similar occurrence 3 Kr[place] ago with COPD exacerbation. Pt reports nervousness at this time. EMS gave 2.5 albuterol neb in route.  EMS vitals  Spo2 93% on chronic 3L  33 RR initally and RR now 18  BP 192/113 initally, most recent 162/109  HR 119

## 2023-07-10 NOTE — ED Provider Notes (Signed)
[facility] Provider Note    Event Date/Time   First MD Initiated Contact with Patient 07/10/23 1501     (approximate)   History   Shortness of Breath   HPI  Courtney Carrillo is a 57 y.o. female with history of COPD who comes in with shortness of breath.  Patient reports that she completed her courses of medications after recent discharge.  She states that last night she did start to feel more short of breath and she was taking her albuterol but her nebulizer is not as good as it is here.  She states that after she got the nebulizer in the EMS truck she significantly felt improved.  She reports that when she is on the steroid she does a lot better.  She states that she is got follow-up with pulmonary coming up soon does not seen them yet.  She denies any chest pain.  No new swelling in legs.  I reviewed patient's hospital stay from 06/29/2023 where patient has a history of anxiety, OSA on CPAP, depression, hypertension, hyperlipidemia she was placed on BiPAP initially she had an elevated D-dimer therefore patient had a CT scan without evidence of pulmonary embolism.  It was thought to be secondary to her COPD and she was discharged on oral prednisone, azithromycin   Physical Exam   Triage Vital Signs: ED Triage Vitals [07/10/23 1438]  Encounter Vitals Group     BP      Systolic BP Percentile      Diastolic BP Percentile      Pulse Rate 100     Resp (!) 21     Temp 97.7 F (36.5 C)     Temp Source Oral     SpO2 98 %     Weight 228 lb (103.4 kg)     Height 5\' 5"  (1.651 m)     Head Circumference      Peak Flow      Pain Score 0     Pain Loc      Pain Education      Exclude from Growth Chart     Most recent vital signs: Vitals:   07/10/23 1438  Pulse: 100  Resp: (!) 21  Temp: 97.7 F (36.5 C)  SpO2: 98%     General: Awake, no distress.  Patient was on her phone talking to someone and laughing when I walked into the room CV:  Good peripheral  perfusion.  Resp:  Normal effort.  Tight air exchange but no significant wheezing Abd:  No distention.  Other:  No swelling in legs.   ED Results / Procedures / Treatments   Labs (all labs ordered are listed, but only abnormal results are displayed) Labs Reviewed  CBC - Abnormal; Notable for the following components:      Result Value   WBC 14.2 (*)    RBC 5.50 (*)    MCV 78.0 (*)    MCH 23.5 (*)    RDW 18.1 (*)    Platelets 427 (*)    All other components within normal limits  BASIC METABOLIC PANEL     EKG  My interpretation of EKG:  Sinus tachycardia rate of 100 without any ST elevation or T wave inversions, normal intervals  RADIOLOGY I have reviewed the xray personally and interpreted and no pneumonia  PROCEDURES:  Critical Care performed: No  .1-3 Lead EKG Interpretation  Performed by: Concha Se, MD Authorized by: Concha Se, MD  Interpretation: normal     ECG rate:  80   ECG rate assessment: normal     Rhythm: sinus rhythm     Ectopy: none     Conduction: normal      MEDICATIONS ORDERED IN ED: Medications  ipratropium-albuterol (DUONEB) 0.5-2.5 (3) MG/3ML nebulizer solution 3 mL (3 mLs Nebulization Given 07/10/23 1630)  predniSONE (DELTASONE) tablet 60 mg (60 mg Oral Given 07/10/23 1630)     IMPRESSION / MDM / ASSESSMENT AND PLAN / ED COURSE  I reviewed the triage vital signs and the nursing notes.   Patient's presentation is most consistent with exacerbation of chronic illness.   Patient comes in with shortness of breath I suspect related to her COPD.  On reassessment she reports feeling much better after the DuoNeb given with EMS and then I also gave her a DuoNeb.  Her workup is reassuring CBC shows elevated white count but downtrending from 9 days ago and she was just on steroids.  Her BMP is reassuring troponin was negative and she reports that this has been going on for greater than 3 days.  I did review and on 11/9 she had elevated  troponin secondary to demand from respiratory distress and they recommended outpatient coronary CTA but no catheterization was done.  She denies any chest pain today and I do not feel a repeat troponin is necessary given her symptoms have been ongoing since yesterday night and her troponin was only 6.  I discussed this with patient and she was okay with holding off on repeat troponin.  I do not feel that this is a PE given she has had multiple CT scans without evidence of pulmonary embolism.  Her BNP is normal.  Procalcitonin is still pending but she denies any infectious symptoms.  Patient states that when she is on the steroid she does have significant improvement.  She got a follow-up appointment with pulmonary on December 4.  Will give her some steroids to help cover her to this appointment although we discussed the risks of recurrent steroid use patient would like to proceed with steroids until she sees them.  She will continue using her albuterol nebulizer at home.  We discussed admission versus going home the patient states that she feels at her baseline self and would prefer to be discharged at this time  We discussed a steroid taper of her steroid burst for 5 days.  She reports that other than the steroid taper that she just had she has not had any recent steroids over the past month.  She would like to do the higher dose of steroids for for 5 days and that we will get her to her pulmonary appointment and she will discuss whether or not they want to start tapering her down after that or have her stop.  The patient is on the cardiac monitor to evaluate for evidence of arrhythmia and/or significant heart rate changes.      FINAL CLINICAL IMPRESSION(S) / ED DIAGNOSES   Final diagnoses:  COPD exacerbation (HCC)     Rx / DC Orders   ED Discharge Orders          Ordered    predniSONE (DELTASONE) 10 MG tablet  Daily        07/10/23 1704             Note:  This document was prepared  using Dragon voice recognition software and may include unintentional dictation errors.   Concha Se, MD 07/10/23  1704  

## 2023-07-10 NOTE — Patient Instructions (Signed)
Visit Information  Thank you for taking time to visit with me today. Please don't hesitate to contact me if I can be of assistance to you before our next scheduled telephone appointment.  Following is a copy of your care plan:   Goals Addressed             This Visit's Progress    COMPLETED: TOC Care Plan       Current Barriers:  Knowledge Deficits related to plan of care for management of HTN, COPD, and Pleural Effusion  Chronic Disease Management support and education needs related to COPD   RNCM Clinical Goal(s):  Patient will work with the Care Management team over the next 30 days to address Transition of Care Barriers: Medication Management Diet/Nutrition/Food Resources Support at home Provider appointments Equipment/DME verbalize basic understanding of  COPD disease process and self health management plan as evidenced by no ED visits in the next 30 days  through collaboration with RN Care manager, provider, and care team.   Interventions: Evaluation of current treatment plan related to  self management and patient's adherence to plan as established by provider   Hypertension Interventions:  (Status:  New goal.) Short Term Goal Last practice recorded BP readings:  BP Readings from Last 3 Encounters:  06/23/23 (!) 143/105  06/20/23 (!) 144/90  06/19/23 125/84   Most recent eGFR/CrCl:  Lab Results  Component Value Date   EGFR 87 01/28/2023    No components found for: "CRCL"  Evaluation of current treatment plan related to hypertension self management and patient's adherence to plan as established by provider Reviewed medications with patient and discussed importance of compliance Assessed social determinant of health barriers  Patient Goals/Self-Care Activities: Participate in Transition of Care Program/Attend [facility] scheduled calls Notify RN Care Manager of TOC call rescheduling needs Take all medications as prescribed Attend all scheduled provider  appointments  Follow Up Plan:  Telephone follow up appointment with care management team member scheduled for:  December 6 with [facility] [place]          Patient verbalizes understanding of instructions and care plan provided today and agrees to view in MyChart. Active MyChart status and patient understanding of how to access instructions and care plan via MyChart confirmed with patient.     Telephone follow up appointment with care management team member scheduled for: July 17, 2023 at 1pm  Please call the care guide team at [phone] if you need to cancel or reschedule your appointment.   Please call the Suicide and Crisis Lifeline: 988 call the [place] National Suicide Prevention Lifeline: 3076426832 or TTY: 612-249-6155 TTY (734) 220-9556) to talk to a trained counselor if you are experiencing a Mental Health or Behavioral Health Crisis or need someone to talk to.  Courtney Ala, RN Medical illustrator [facility] [phone]

## 2023-07-10 NOTE — Discharge Instructions (Addendum)
We discussed steroid taper versus a steroid burst.  We are going to put you on the 40 mg for 5 days straight and this will get you to your pulmonary doctor appointment and then you can discuss with them if they want you to just stop taking them or if they want to put you on a taper after that given you state that you feel much better when you are on the steroids.  Continue to use your albuterol treatments at home and return to the ER for worsening shortness of breath or any other concerns

## 2023-07-12 DIAGNOSIS — E876 Hypokalemia: Secondary | ICD-10-CM | POA: Diagnosis not present

## 2023-07-12 DIAGNOSIS — J40 Bronchitis, not specified as acute or chronic: Secondary | ICD-10-CM | POA: Diagnosis not present

## 2023-07-12 DIAGNOSIS — R0902 Hypoxemia: Secondary | ICD-10-CM | POA: Diagnosis not present

## 2023-07-12 DIAGNOSIS — J9601 Acute respiratory failure with hypoxia: Secondary | ICD-10-CM | POA: Diagnosis not present

## 2023-07-13 ENCOUNTER — Telehealth: Payer: Self-pay

## 2023-07-13 NOTE — Transitions of Care (Post Inpatient/ED Visit) (Signed)
07/13/2023  Name: Courtney Carrillo MRN: [id number] DOB: 23-Dec-1965  Today's TOC FU Call Status: Today's TOC FU Call Status:: Successful TOC FU Call Completed TOC FU Call Complete Date: 07/13/23 Patient's Name and Date of Birth confirmed.  Transition Care Management Follow-up Telephone Call Date of Discharge: 07/10/23 Discharge Facility: [facility]) Type of Discharge: Inpatient Admission Primary Inpatient Discharge Diagnosis:: COPD How have you been since you were released from the hospital?: Better Any questions or concerns?: No  Items Reviewed: Did you receive and understand the discharge instructions provided?: Yes Medications obtained,verified, and reconciled?: Yes (Medications Reviewed) Any new allergies since your discharge?: No Dietary orders reviewed?: Yes  Medications Reviewed Today: Medications Reviewed Today     Reviewed by Karena Addison, LPN (Licensed Practical Nurse) on 07/13/23 at 1656  Med List Status: <None>   Medication Order Taking? Sig Documenting Provider Last Dose Status Informant  acetaminophen-codeine (TYLENOL #3) 300-30 MG tablet [id number] No Take 1 tablet by mouth every 8 (eight) hours as needed for moderate pain (pain score 4-6). Lorre Munroe, NP prn Active Self, Child  albuterol (PROVENTIL) (2.5 MG/3ML) 0.083% nebulizer solution [id number]  Take 3 mLs (2.5 mg total) by nebulization every 4 (four) hours as needed for wheezing or shortness of breath. Tommi Rumps, PA-C  Active Self, Child           Med Note (CARD, AMY L   Sun Jun 21, 2023  1:23 PM) Patient was given a script for a nebulizer.  amLODipine (NORVASC) 10 MG tablet [id number] No Take 1 tablet (10 mg total) by mouth daily. Lucile Shutters, MD 06/30/2023 Active Self, Child  atorvastatin (LIPITOR) 40 MG tablet [id number] No TAKE 1 TABLET(40 MG) BY MOUTH EVERY MORNING  Patient taking differently: Take 40 mg by mouth daily.   Lorre Munroe, NP 06/29/2023 Active  Self, Child  benztropine (COGENTIN) 1 MG tablet [id number] No Take 1 mg by mouth every morning. [provider] 06/29/2023 Active Self, Child  budesonide-formoterol (SYMBICORT) 160-4.5 MCG/ACT inhaler [id number] No Inhale 2 puffs into the lungs 2 (two) times daily. Lucile Shutters, MD 06/29/2023 Expired 07/05/23 2359 Self, Child  busPIRone (BUSPAR) 10 MG tablet [id number] No Take 20 mg by mouth 2 (two) times daily. [provider] 06/29/2023 Active Self, Child  carvedilol (COREG) 3.125 MG tablet [id number] No Take 1 tablet (3.125 mg total) by mouth 2 (two) times daily with a meal. Agbata, Tochukwu, MD 06/29/2023 Expired 07/05/23 2359 Self, Child  doxepin (SINEQUAN) 50 MG capsule [id number] No Take 50 mg by mouth at bedtime. [provider] 06/29/2023 Active Self, Child  OXYGEN [id number]  Inhale 2 L/min into the lungs continuous. [provider]  Active   pantoprazole (PROTONIX) 40 MG tablet [id number]  Take 1 tablet (40 mg total) by mouth daily. Lucile Shutters, MD  Active   predniSONE (DELTASONE) 10 MG tablet [id number]  Take 4 tablets (40 mg total) by mouth daily for 5 days. Concha Se, MD  Active   VRAYLAR 6 MG CAPS [id number] No Take 1 capsule by mouth daily. [provider] 06/29/2023 Active Self, Child            Home Care and Equipment/Supplies: Were Home Health Services Ordered?: NA Any new equipment or medical supplies ordered?: NA Were you able to get the equipment/medical supplies?: No Do you have any questions related to the use of the equipment/supplies?: No  Functional Questionnaire: Do you need assistance with bathing/showering or dressing?: No Do you  need assistance with meal preparation?: No Do you need assistance with eating?: No Do you have difficulty maintaining continence: No Do you need assistance with getting out of bed/getting out of a chair/moving?: No Do you have difficulty managing or taking your medications?:  No  Follow up appointments reviewed: PCP Follow-up appointment confirmed?: Yes Date of PCP follow-up appointment?: 07/22/23 Follow-up Provider: [facility] Follow-up appointment confirmed?: Yes Date of Specialist follow-up appointment?: 07/15/23 Follow-Up Specialty Provider:: Pulmo Do you need transportation to your follow-up appointment?: No Do you understand care options if your condition(s) worsen?: Yes-patient verbalized understanding    SIGNATURE Karena Addison, LPN [facility] Nurse Health Advisor Direct Dial [phone]

## 2023-07-14 NOTE — Telephone Encounter (Signed)
Will discuss at upcoming appt.

## 2023-07-15 DIAGNOSIS — R911 Solitary pulmonary nodule: Secondary | ICD-10-CM | POA: Diagnosis not present

## 2023-07-17 ENCOUNTER — Other Ambulatory Visit: Payer: 59

## 2023-07-20 ENCOUNTER — Ambulatory Visit: Payer: Self-pay

## 2023-07-20 NOTE — Telephone Encounter (Signed)
Chief Complaint: SOB Symptoms: Mild to Moderate SOB Frequency: Constant Pertinent Negatives: Patient denies chest pain fever, nausea, vomiting  Disposition: [] ED /[] Urgent Care (no appt availability in office) / [] Appointment(In office/virtual)/ []  [facility]/ [] Home Care/ [x] Refused Recommended Disposition /[] [facility] [facility]/ []  Follow-up with PCP Additional Notes: Patient stated she has had SOB since Saturday and has been using her nebulizer treatment every 4 hours at home. Patient states she is running low on the medication that uses in the nebulizer. Patient denies other symptoms. Care advice given, no appointments available in the office today. Offered patient an urgent care appointment and she declined. Patient very upset. Stated I would make provider aware and seek additional recommendations.  Reason for Disposition  [1] Longstanding difficulty breathing (e.g., CHF, COPD, emphysema) AND [2] WORSE than normal  Answer Assessment - Initial Assessment Questions 1. RESPIRATORY STATUS: "Describe your breathing?" (e.g., wheezing, shortness of breath, unable to speak, severe coughing)      SOB 2. ONSET: "When did this breathing problem begin?"      Saturday  3. PATTERN "Does the difficult breathing come and go, or has it been constant since it started?"      Constant 4. SEVERITY: "How bad is your breathing?" (e.g., mild, moderate, severe)    - MILD: No SOB at rest, mild SOB with walking, speaks normally in sentences, can lie down, no retractions, pulse < 100.    - MODERATE: SOB at rest, SOB with minimal exertion and prefers to sit, cannot lie down flat, speaks in phrases, mild retractions, audible wheezing, pulse 100-120.    - SEVERE: Very SOB at rest, speaks in single words, struggling to breathe, sitting hunched forward, retractions, pulse > 120      Mild-Moderate 5. RECURRENT SYMPTOM: "Have you had difficulty breathing before?" If Yes, ask: "When was the last time?" and  "What happened that time?"      Yes I use my nebulizer treatment  6. CARDIAC HISTORY: "Do you have any history of heart disease?" (e.g., heart attack, angina, bypass surgery, angioplasty)      No  7. LUNG HISTORY: "Do you have any history of lung disease?"  (e.g., pulmonary embolus, asthma, emphysema)     COPD 8. CAUSE: "What do you think is causing the breathing problem?"      My COPD 9. OTHER SYMPTOMS: "Do you have any other symptoms? (e.g., dizziness, runny nose, cough, chest pain, fever)     NO 10. O2 SATURATION MONITOR:  "Do you use an oxygen saturation monitor (pulse oximeter) at home?" If Yes, ask: "What is your reading (oxygen level) today?" "What is your usual oxygen saturation reading?" (e.g., 95%)       Unknown  Protocols used: Breathing Difficulty-A-AH

## 2023-07-20 NOTE — Telephone Encounter (Signed)
I can work her in however she has been hospitalized a few times for this due to fluid in the lung.  If her shortness of breath is a significant, then I would recommend that she go to the ER.

## 2023-07-20 NOTE — Telephone Encounter (Signed)
Patient advised to go to ER.   Patient verbally understood.

## 2023-07-22 ENCOUNTER — Encounter: Payer: Self-pay | Admitting: Internal Medicine

## 2023-07-22 ENCOUNTER — Ambulatory Visit: Payer: 59 | Admitting: Internal Medicine

## 2023-07-22 VITALS — BP 138/80 | HR 88 | Ht 65.0 in | Wt 229.0 lb

## 2023-07-22 DIAGNOSIS — J9601 Acute respiratory failure with hypoxia: Secondary | ICD-10-CM | POA: Diagnosis not present

## 2023-07-22 DIAGNOSIS — J90 Pleural effusion, not elsewhere classified: Secondary | ICD-10-CM | POA: Diagnosis not present

## 2023-07-22 DIAGNOSIS — J189 Pneumonia, unspecified organism: Secondary | ICD-10-CM

## 2023-07-22 DIAGNOSIS — J441 Chronic obstructive pulmonary disease with (acute) exacerbation: Secondary | ICD-10-CM | POA: Diagnosis not present

## 2023-07-22 DIAGNOSIS — S2242XG Multiple fractures of ribs, left side, subsequent encounter for fracture with delayed healing: Secondary | ICD-10-CM

## 2023-07-22 MED ORDER — ACETAMINOPHEN-CODEINE 300-30 MG PO TABS: 1.0000 | ORAL_TABLET | Freq: Three times a day (TID) | ORAL | 0 refills | Status: DC | PRN

## 2023-07-22 NOTE — Patient Instructions (Signed)
Chronic Obstructive Pulmonary Disease  Chronic obstructive pulmonary disease (COPD) is a long-term (chronic) lung problem. When you have COPD, it is hard for air to get in and out of your lungs. Usually the condition gets worse over time, and your lungs will never return to normal. There are things you can do to keep yourself as healthy as possible. What are the causes? Smoking. This is the most common cause. Certain genes passed from parent to child (inherited). What increases the risk? Being exposed to secondhand smoke from cigarettes, pipes, or cigars. Being exposed to chemicals and other irritants, such as fumes and dust in the work environment. Having chronic lung conditions or infections. What are the signs or symptoms? Shortness of breath, especially during physical activity. A long-term cough with a large amount of thick mucus. Sometimes, the cough may not have any mucus (dry cough). Wheezing. Breathing quickly. Skin that looks gray or blue, especially in the fingers, toes, or lips. Feeling tired (fatigue). Weight loss. Chest tightness. Having infections often. Episodes when breathing symptoms become much worse (exacerbations). At the later stages of this disease, you may have swelling in the ankles, feet, or legs. How is this treated? Taking medicines. Quitting smoking, if you smoke. Rehabilitation. This includes steps to make your body work better. It may involve a team of specialists. Doing exercises. Making changes to your diet. Using oxygen. Lung surgery. Lung transplant. Comfort measures (palliative care). Follow these instructions at home: Medicines Take over-the-counter and prescription medicines only as told by your doctor. Talk to your doctor before taking any cough or allergy medicines. You may need to avoid medicines that cause your lungs to be dry. Lifestyle If you smoke, stop smoking. Smoking makes the problem worse. Do not smoke or use any products that  contain nicotine or tobacco. If you need help quitting, ask your doctor. Avoid being around things that make your breathing worse. This may include smoke, chemicals, and fumes. Stay active, but remember to rest as well. Learn and use tips on how to manage stress and control your breathing. Make sure you get enough sleep. Most adults need at least 7 hours of sleep every night. Eat healthy foods. Eat smaller meals more often. Rest before meals. Controlled breathing Learn and use tips on how to control your breathing as told by your doctor. Try: Breathing in (inhaling) through your nose for 1 second. Then, pucker your lips and breath out (exhale) through your lips for 2 seconds. Putting one hand on your belly (abdomen). Breathe in slowly through your nose for 1 second. Your hand on your belly should move out. Pucker your lips and breathe out slowly through your lips. Your hand on your belly should move in as you breathe out.  Controlled coughing Learn and use controlled coughing to clear mucus from your lungs. Follow these steps: Lean your head a little forward. Breathe in deeply. Try to hold your breath for 3 seconds. Keep your mouth slightly open while coughing 2 times. Spit any mucus out into a tissue. Rest and do the steps again 1 or 2 times as needed. General instructions Make sure you get all the shots (vaccines) that your doctor recommends. Ask your doctor about a flu shot and a pneumonia shot. Use oxygen therapy and pulmonary rehabilitation if told by your doctor. If you need home oxygen therapy, ask your doctor if you should buy a tool to measure your oxygen level (oximeter). Make a COPD action plan with your doctor. This helps you   to know what to do if you feel worse than usual. Manage any other conditions you have as told by your doctor. Avoid going outside when it is very hot, cold, or humid. Avoid people who have a sickness you can catch (contagious). Keep all follow-up  visits. Contact a doctor if: You cough up more mucus than usual. There is a change in the color or thickness of the mucus. It is harder to breathe than usual. Your breathing is faster than usual. You have trouble sleeping. You need to use your medicines more often than usual. You have trouble doing your normal activities such as getting dressed or walking around the house. Get help right away if: You have shortness of breath while resting. You have shortness of breath that stops you from: Being able to talk. Doing normal activities. Your chest hurts for longer than 5 minutes. Your skin color is more blue than usual. Your pulse oximeter shows that you have low oxygen for longer than 5 minutes. You have a fever. You feel too tired to breathe normally. These symptoms may represent a serious problem that is an emergency. Do not wait to see if the symptoms will go away. Get medical help right away. Call your local emergency services (911 in the [place] Do not drive yourself to the hospital. Summary Chronic obstructive pulmonary disease (COPD) is a long-term lung problem. The way your lungs work will never return to normal. Usually the condition gets worse over time. There are things you can do to keep yourself as healthy as possible. Take over-the-counter and prescription medicines only as told by your doctor. If you smoke, stop. Smoking makes the problem worse. This information is not intended to replace advice given to you by your health care provider. Make sure you discuss any questions you have with your health care provider. Document Revised: 06/04/2020 Document Reviewed: 06/05/2020 Elsevier Patient Education  2024 [employer]  

## 2023-07-22 NOTE — Progress Notes (Signed)
Subjective:    Patient ID: Courtney Carrillo, female    DOB: 08/27/1965, 57 y.o.   MRN: [id number]  HPI  Patient presents to the clinic today for multiple ER and hospital follow-up.  She presented to the ER 11/8 with complaint of cough and worsening shortness of breath.  She had previously sustained a rib fracture 04/2023 complicated by recurrent pneumonia and left pleural effusion which has had to be drained.  X-ray showed a left lower lobe pneumonia.  Her troponin was slightly elevated suggesting demand ischemia.  Her CTA chest was negative.  They were going to prescribe her doxycycline, prednisone and albuterol however cardiothoracic surgery was consulted and it was recommended that she be transferred to [facility] for chest tube placement and antibiotics.  Her chest tube was subsequently able to be removed.  Cardiology recommended that she have an outpatient CT coronary.  She was discharged on 11/12 a 14-day course of Augmentin.  She subsequently presented back to the ER 11/18 with worsening shortness of breath.  Her WBC count was elevated and her potassium was low.  Chest x-ray did not show any acute findings.  CTA of the chest was negative.  She ended up requiring BiPAP.  She was initially on IV Solu-Medrol which was transitioned to oral prednisone for COPD exacerbation.  She was discharged 11/21 with Zithromax and DuoNebs as well.  She had another ER visit 11/29 for shortness of breath.  She was given a dose of prednisone and DuoNeb in the ER.  She was discharged with a Pred taper and DuoNebs on the same day.  She has seen Dr. Karna Christmas 12/4 for follow-up.  FEV1 was >59%.  She was started on Symbicort and set up for a sleep study at that time.  Since her last discharge, She reports reoccurring pain in the left side of the ribs. She reports wheezing, cough but denies shortness of breath. The cough is not productive. She has not noticed any swelling in her legs. She is taking symbicort as  prescribed. She reports she is taking albuterol and duonebs as prescribed. She is currently on antibiotics and steroids. She does not smoke.  Review of Systems   Past Medical History:  Diagnosis Date   Anemia    2015   Anxiety    COPD (chronic obstructive pulmonary disease) (HCC)    Depression    Headache    H/O   Hyperlipidemia    Hypertension    Sleep apnea    CPAP    Current Outpatient Medications  Medication Sig Dispense Refill   acetaminophen-codeine (TYLENOL #3) 300-30 MG tablet Take 1 tablet by mouth every 8 (eight) hours as needed for moderate pain (pain score 4-6). 20 tablet 0   albuterol (PROVENTIL) (2.5 MG/3ML) 0.083% nebulizer solution Take 3 mLs (2.5 mg total) by nebulization every 4 (four) hours as needed for wheezing or shortness of breath. 75 mL 2   amLODipine (NORVASC) 10 MG tablet Take 1 tablet (10 mg total) by mouth daily. 30 tablet 11   atorvastatin (LIPITOR) 40 MG tablet TAKE 1 TABLET(40 MG) BY MOUTH EVERY MORNING (Patient taking differently: Take 40 mg by mouth daily.) 90 tablet 2   benztropine (COGENTIN) 1 MG tablet Take 1 mg by mouth every morning.     budesonide-formoterol (SYMBICORT) 160-4.5 MCG/ACT inhaler Inhale 2 puffs into the lungs 2 (two) times daily. 1 each 12   busPIRone (BUSPAR) 10 MG tablet Take 20 mg by mouth 2 (two) times daily.  carvedilol (COREG) 3.125 MG tablet Take 1 tablet (3.125 mg total) by mouth 2 (two) times daily with a meal. 60 tablet 0   doxepin (SINEQUAN) 50 MG capsule Take 50 mg by mouth at bedtime.     OXYGEN Inhale 2 L/min into the lungs continuous.     pantoprazole (PROTONIX) 40 MG tablet Take 1 tablet (40 mg total) by mouth daily. 30 tablet 1   VRAYLAR 6 MG CAPS Take 1 capsule by mouth daily.     No current facility-administered medications for this visit.    Allergies  Allergen Reactions   Lisinopril Cough   Losartan Cough   Other Other (See Comments)    Perfumes : Congestion    Family History  Problem Relation  Age of Onset   Diabetes Mother    Breast cancer Sister     Social History   Socioeconomic History   Marital status: Single    Spouse name: Not on file   Number of children: Not on file   Years of education: Not on file   Highest education level: Not on file  Occupational History   Not on file  Tobacco Use   Smoking status: Former    Current packs/day: 0.50    Average packs/day: 0.5 packs/day for 35.3 years (17.6 ttl pk-yrs)    Types: Cigarettes    Start date: 09/12/2021   Smokeless tobacco: Never  Vaping Use   Vaping status: Never Used  Substance and Sexual Activity   Alcohol use: No   Drug use: Yes    Frequency: 7.0 times per week    Types: Marijuana   Sexual activity: Not on file  Other Topics Concern   Not on file  Social History Narrative   Not on file   Social Determinants of Health   Financial Resource Strain: Low Risk  (07/15/2023)   Received from [facility]   Overall Financial Resource Strain (CARDIA)    Difficulty of Paying Living Expenses: Not hard at all  Food Insecurity: No Food Insecurity (07/15/2023)   Received from [facility]   Hunger Vital Sign    Worried About Running Out of Food in the Last Year: Never true    Ran Out of Food in the Last Year: Never true  Transportation Needs: No Transportation Needs (07/15/2023)   Received from [facility] - Transportation    In the past 12 months, has lack of transportation kept you from medical appointments or from getting medications?: No    Lack of Transportation (Non-Medical): No  Physical Activity: Inactive (06/11/2023)   Exercise Vital Sign    Days of Exercise per Week: 0 days    Minutes of Exercise per Session: 0 min  Stress: No Stress Concern Present (12/25/2022)   [employer] of Occupational Health - Occupational Stress Questionnaire    Feeling of Stress : Not at all  Social Connections: Socially Isolated (06/11/2023)   Social  Connection and Isolation Panel [NHANES]    Frequency of Communication with Friends and Family: More than three times a week    Frequency of Social Gatherings with Friends and Family: Once a week    Attends Religious Services: Never    Database administrator or Organizations: No    Attends Banker Meetings: Never    Marital Status: Never married  Intimate Partner Violence: Not At Risk (07/03/2023)   Humiliation, Afraid, Rape, and Kick questionnaire    Fear of Current  or Ex-Partner: No    Emotionally Abused: No    Physically Abused: No    Sexually Abused: No     Constitutional: Denies fever, malaise, fatigue, headache or abrupt weight changes.  HEENT: Denies eye pain, eye redness, ear pain, ringing in the ears, wax buildup, runny nose, nasal congestion, bloody nose, or sore throat. Respiratory: Pt reports cough and wheezing. Denies difficulty breathing, shortness of breath, or sputum production.   Cardiovascular: Denies chest pain, chest tightness, palpitations or swelling in the hands or feet.  Gastrointestinal: Denies abdominal pain, bloating, constipation, diarrhea or blood in the stool.  GU: Denies urgency, frequency, pain with urination, burning sensation, blood in urine, odor or discharge. Musculoskeletal: Denies decrease in range of motion, difficulty with gait, muscle pain or joint pain and swelling.  Skin: Denies redness, rashes, lesions or ulcercations.  Neurological: Denies dizziness, difficulty with memory, difficulty with speech or problems with balance and coordination.  Psych: Denies anxiety, depression, SI/HI.  No other specific complaints in a complete review of systems (except as listed in HPI above).      Objective:   Physical Exam  BP 138/80   Pulse 88   Ht 5\' 5"  (1.651 m)   Wt 229 lb (103.9 kg)   SpO2 94%   BMI 38.11 kg/m   Wt Readings from Last 3 Encounters:  07/10/23 228 lb (103.4 kg)  06/20/23 230 lb 8 oz (104.6 kg)  06/19/23 271 lb 2.7  oz (123 kg)    General: Appears her stated age, obese, in NAD. Neck:  Neck supple, trachea midline. No masses, lumps or thyromegaly present.  Cardiovascular: Normal rate and rhythm. S1,S2 noted.  No murmur, rubs or gallops noted. No JVD or BLE edema. No carotid bruits noted. Pulmonary/Chest: Normal effort and diminished breath sounds. No respiratory distress. No wheezes, rales or ronchi noted.  Musculoskeletal: Pain with palpation at the left lateral ribs. No difficulty with gait.  Neurological: Alert and oriented.    BMET    Component Value Date/Time   NA 138 07/10/2023 1443   K 3.6 07/10/2023 1443   CL 101 07/10/2023 1443   CO2 26 07/10/2023 1443   GLUCOSE 143 (H) 07/10/2023 1443   BUN 6 07/10/2023 1443   CREATININE 0.72 07/10/2023 1443   CREATININE 0.79 01/28/2023 0000   CALCIUM 9.1 07/10/2023 1443   GFRNONAA >60 07/10/2023 1443   GFRNONAA 74 08/17/2020 0814   GFRAA 86 08/17/2020 0814    Lipid Panel     Component Value Date/Time   CHOL 169 06/22/2023 0846   TRIG 147 06/22/2023 0846   HDL 54 06/22/2023 0846   CHOLHDL 3.1 06/22/2023 0846   VLDL 29 06/22/2023 0846   LDLCALC 86 06/22/2023 0846   LDLCALC 84 01/28/2023 0000    CBC    Component Value Date/Time   WBC 14.2 (H) 07/10/2023 1443   RBC 5.50 (H) 07/10/2023 1443   HGB 12.9 07/10/2023 1443   HCT 42.9 07/10/2023 1443   PLT 427 (H) 07/10/2023 1443   MCV 78.0 (L) 07/10/2023 1443   MCH 23.5 (L) 07/10/2023 1443   MCHC 30.1 07/10/2023 1443   RDW 18.1 (H) 07/10/2023 1443   LYMPHSABS 2.2 06/19/2023 1039   MONOABS 0.6 06/19/2023 1039   EOSABS 0.6 (H) 06/19/2023 1039   BASOSABS 0.1 06/19/2023 1039    Hgb A1C Lab Results  Component Value Date   HGBA1C 6.3 (H) 01/28/2023            Assessment & Plan:  Hospital follow-up for rib fracture with delayed healing, recurrent pneumonia, recurrent left pleural effusion, COPD exacerbation and concern for sleep apnea:  Hospital notes, labs and imaging  reviewed Pulmonology notes and procedures reviewed Continue Symbicort, prednisone and antibiotics as previously prescribed She does not feel like albuterol and DuoNebs are effective, will give sample Yuleperi today Tylenol 3 refilled for rib pain, advised her this would likely be the last prescription as her rib fracture should be healed at this time  Schedule an appointment for your annual exam Nicki Reaper, NP

## 2023-07-24 ENCOUNTER — Other Ambulatory Visit: Payer: Self-pay

## 2023-08-03 DIAGNOSIS — J4540 Moderate persistent asthma, uncomplicated: Secondary | ICD-10-CM | POA: Diagnosis not present

## 2023-08-10 NOTE — Patient Outreach (Signed)
  Care Management   Visit Note   Name: Courtney Carrillo MRN: [id number] DOB: [birth year]  Subjective: Courtney Carrillo is a 57 y.o. year old female who is a primary care patient of Lorre Munroe, NP. The Care Management team was consulted for assistance.      Engaged with Courtney Carrillo via telephone  Assessment:  Review of patient past medical history, allergies, medications, health status, including review of consultants reports, laboratory and other test data, was performed as part of  evaluation and provision of care management services.   Outpatient Encounter Medications as of 07/24/2023  Medication Sig Note   acetaminophen-codeine (TYLENOL #3) 300-30 MG tablet Take 1 tablet by mouth every 8 (eight) hours as needed for moderate pain (pain score 4-6).    albuterol (PROVENTIL) (2.5 MG/3ML) 0.083% nebulizer solution Take 3 mLs (2.5 mg total) by nebulization every 4 (four) hours as needed for wheezing or shortness of breath. 06/21/2023: Patient was given a script for a nebulizer.   amLODipine (NORVASC) 10 MG tablet Take 1 tablet (10 mg total) by mouth daily.    atorvastatin (LIPITOR) 40 MG tablet TAKE 1 TABLET(40 MG) BY MOUTH EVERY MORNING (Patient taking differently: Take 40 mg by mouth daily.)    [EXPIRED] azithromycin (ZITHROMAX) 250 MG tablet Take 250 mg by mouth.    [EXPIRED] benzonatate (TESSALON) 100 MG capsule Take 100 mg by mouth 3 (three) times daily.    benztropine (COGENTIN) 1 MG tablet Take 1 mg by mouth every morning.    budesonide-formoterol (SYMBICORT) 160-4.5 MCG/ACT inhaler Inhale 2 puffs into the lungs 2 (two) times daily.    busPIRone (BUSPAR) 10 MG tablet Take 20 mg by mouth 2 (two) times daily.    carvedilol (COREG) 3.125 MG tablet Take 1 tablet (3.125 mg total) by mouth 2 (two) times daily with a meal.    clonazePAM (KLONOPIN) 0.5 MG tablet Take 0.5 mg by mouth daily.    COMBIVENT RESPIMAT 20-100 MCG/ACT AERS respimat Inhale 2 puffs into the lungs every 6 (six) hours as  needed for wheezing or shortness of breath.    doxepin (SINEQUAN) 50 MG capsule Take 50 mg by mouth at bedtime.    OXYGEN Inhale 2 L/min into the lungs continuous.    pantoprazole (PROTONIX) 40 MG tablet Take 1 tablet (40 mg total) by mouth daily.    predniSONE (DELTASONE) 20 MG tablet Take 20 mg by mouth.    VRAYLAR 6 MG CAPS Take 1 capsule by mouth daily.    No facility-administered encounter medications on file as of 07/24/2023.

## 2023-08-12 DIAGNOSIS — J90 Pleural effusion, not elsewhere classified: Secondary | ICD-10-CM | POA: Diagnosis not present

## 2023-08-18 DIAGNOSIS — J4540 Moderate persistent asthma, uncomplicated: Secondary | ICD-10-CM | POA: Diagnosis not present

## 2023-08-19 ENCOUNTER — Ambulatory Visit (INDEPENDENT_AMBULATORY_CARE_PROVIDER_SITE_OTHER): Payer: 59 | Admitting: Internal Medicine

## 2023-08-19 ENCOUNTER — Encounter: Payer: Self-pay | Admitting: Internal Medicine

## 2023-08-19 VITALS — BP 120/88 | Ht 65.0 in | Wt 238.0 lb

## 2023-08-19 DIAGNOSIS — E66812 Obesity, class 2: Secondary | ICD-10-CM | POA: Diagnosis not present

## 2023-08-19 DIAGNOSIS — R7303 Prediabetes: Secondary | ICD-10-CM

## 2023-08-19 DIAGNOSIS — E782 Mixed hyperlipidemia: Secondary | ICD-10-CM | POA: Diagnosis not present

## 2023-08-19 DIAGNOSIS — Z0001 Encounter for general adult medical examination with abnormal findings: Secondary | ICD-10-CM | POA: Diagnosis not present

## 2023-08-19 DIAGNOSIS — Z6839 Body mass index (BMI) 39.0-39.9, adult: Secondary | ICD-10-CM

## 2023-08-19 NOTE — Assessment & Plan Note (Signed)
 Encourage diet and exercise for weight loss

## 2023-08-19 NOTE — Patient Instructions (Signed)
 Health Maintenance for Postmenopausal Women Menopause is a normal process in which your ability to get pregnant comes to an end. This process happens slowly over many months or years, usually between the ages of 76 and 38. Menopause is complete when you have missed your menstrual period for 12 months. It is important to talk with your health care provider about some of the most common conditions that affect women after menopause (postmenopausal women). These include heart disease, cancer, and bone loss (osteoporosis). Adopting a healthy lifestyle and getting preventive care can help to promote your health and wellness. The actions you take can also lower your chances of developing some of these common conditions. What are the signs and symptoms of menopause? During menopause, you may have the following symptoms: Hot flashes. These can be moderate or severe. Night sweats. Decrease in sex drive. Mood swings. Headaches. Tiredness (fatigue). Irritability. Memory problems. Problems falling asleep or staying asleep. Talk with your health care provider about treatment options for your symptoms. Do I need hormone replacement therapy? Hormone replacement therapy is effective in treating symptoms that are caused by menopause, such as hot flashes and night sweats. Hormone replacement carries certain risks, especially as you become older. If you are thinking about using estrogen or estrogen with progestin, discuss the benefits and risks with your health care provider. How can I reduce my risk for heart disease and stroke? The risk of heart disease, heart attack, and stroke increases as you age. One of the causes may be a change in the body's hormones during menopause. This can affect how your body uses dietary fats, triglycerides, and cholesterol. Heart attack and stroke are medical emergencies. There are many things that you can do to help prevent heart disease and stroke. Watch your blood pressure High  blood pressure causes heart disease and increases the risk of stroke. This is more likely to develop in people who have high blood pressure readings or are overweight. Have your blood pressure checked: Every 3-5 years if you are 32-23 years of age. Every year if you are 31 years old or older. Eat a healthy diet  Eat a diet that includes plenty of vegetables, fruits, low-fat dairy products, and lean protein. Do not eat a lot of foods that are high in solid fats, added sugars, or sodium. Get regular exercise Get regular exercise. This is one of the most important things you can do for your health. Most adults should: Try to exercise for at least 150 minutes each week. The exercise should increase your heart rate and make you sweat (moderate-intensity exercise). Try to do strengthening exercises at least twice each week. Do these in addition to the moderate-intensity exercise. Spend less time sitting. Even light physical activity can be beneficial. Other tips Work with your health care provider to achieve or maintain a healthy weight. Do not use any products that contain nicotine or tobacco. These products include cigarettes, chewing tobacco, and vaping devices, such as e-cigarettes. If you need help quitting, ask your health care provider. Know your numbers. Ask your health care provider to check your cholesterol and your blood sugar (glucose). Continue to have your blood tested as directed by your health care provider. Do I need screening for cancer? Depending on your health history and family history, you may need to have cancer screenings at different stages of your life. This may include screening for: Breast cancer. Cervical cancer. Lung cancer. Colorectal cancer. What is my risk for osteoporosis? After menopause, you may be  at increased risk for osteoporosis. Osteoporosis is a condition in which bone destruction happens more quickly than new bone creation. To help prevent osteoporosis or  the bone fractures that can happen because of osteoporosis, you may take the following actions: If you are 24-54 years old, get at least 1,000 mg of calcium and at least 600 international units (IU) of vitamin D  per day. If you are older than age 75 but younger than age 30, get at least 1,200 mg of calcium and at least 600 international units (IU) of vitamin D  per day. If you are older than age 8, get at least 1,200 mg of calcium and at least 800 international units (IU) of vitamin D  per day. Smoking and drinking excessive alcohol increase the risk of osteoporosis. Eat foods that are rich in calcium and vitamin D , and do weight-bearing exercises several times each week as directed by your health care provider. How does menopause affect my mental health? Depression may occur at any age, but it is more common as you become older. Common symptoms of depression include: Feeling depressed. Changes in sleep patterns. Changes in appetite or eating patterns. Feeling an overall lack of motivation or enjoyment of activities that you previously enjoyed. Frequent crying spells. Talk with your health care provider if you think that you are experiencing any of these symptoms. General instructions See your health care provider for regular wellness exams and vaccines. This may include: Scheduling regular health, dental, and eye exams. Getting and maintaining your vaccines. These include: Influenza vaccine. Get this vaccine each year before the flu season begins. Pneumonia vaccine. Shingles vaccine. Tetanus, diphtheria, and pertussis (Tdap) booster vaccine. Your health care provider may also recommend other immunizations. Tell your health care provider if you have ever been abused or do not feel safe at home. Summary Menopause is a normal process in which your ability to get pregnant comes to an end. This condition causes hot flashes, night sweats, decreased interest in sex, mood swings, headaches, or lack  of sleep. Treatment for this condition may include hormone replacement therapy. Take actions to keep yourself healthy, including exercising regularly, eating a healthy diet, watching your weight, and checking your blood pressure and blood sugar levels. Get screened for cancer and depression. Make sure that you are up to date with all your vaccines. This information is not intended to replace advice given to you by your health care provider. Make sure you discuss any questions you have with your health care provider. Document Revised: 12/17/2020 Document Reviewed: 12/17/2020 Elsevier Patient Education  2024 [employer].

## 2023-08-19 NOTE — Progress Notes (Signed)
 Subjective:    Patient ID: Courtney Carrillo, female    DOB: 1966-01-23, 58 y.o.   MRN: [id number]  HPI  Patient presents to clinic today for her annual exam.  Flu: 04/2023 Tetanus: 06/2012 COVID: Pfizer x3 Shingrix: Never Prevnar 20 , 06/2023 Pap smear: 12/2020 Mammogram: 02/2023 Colon screening: Flex sig, 03/2019 Vision screening: annually Dentist: as needed, dentures  Diet: She does eat meat. She consumes fruits and veggies. She does eat some fried foods. She drinks mostly soda, tea and water. Exercise: Walking  Review of Systems     Past Medical History:  Diagnosis Date   Anemia    2015   Anxiety    COPD (chronic obstructive pulmonary disease) (HCC)    Depression    Headache    H/O   Hyperlipidemia    Hypertension    Sleep apnea    CPAP    Current Outpatient Medications  Medication Sig Dispense Refill   acetaminophen -codeine  (TYLENOL  #3) 300-30 MG tablet Take 1 tablet by mouth every 8 (eight) hours as needed for moderate pain (pain score 4-6). 20 tablet 0   albuterol  (PROVENTIL ) (2.5 MG/3ML) 0.083% nebulizer solution Take 3 mLs (2.5 mg total) by nebulization every 4 (four) hours as needed for wheezing or shortness of breath. 75 mL 2   amLODipine  (NORVASC ) 10 MG tablet Take 1 tablet (10 mg total) by mouth daily. 30 tablet 11   atorvastatin  (LIPITOR) 40 MG tablet TAKE 1 TABLET(40 MG) BY MOUTH EVERY MORNING (Patient taking differently: Take 40 mg by mouth daily.) 90 tablet 2   benztropine  (COGENTIN ) 1 MG tablet Take 1 mg by mouth every morning.     budesonide -formoterol  (SYMBICORT ) 160-4.5 MCG/ACT inhaler Inhale 2 puffs into the lungs 2 (two) times daily. 1 each 12   busPIRone  (BUSPAR ) 10 MG tablet Take 20 mg by mouth 2 (two) times daily.     carvedilol  (COREG ) 3.125 MG tablet Take 1 tablet (3.125 mg total) by mouth 2 (two) times daily with a meal. 60 tablet 0   clonazePAM (KLONOPIN) 0.5 MG tablet Take 0.5 mg by mouth daily.     COMBIVENT RESPIMAT 20-100 MCG/ACT AERS  respimat Inhale 2 puffs into the lungs every 6 (six) hours as needed for wheezing or shortness of breath.     doxepin  (SINEQUAN ) 50 MG capsule Take 50 mg by mouth at bedtime.     OXYGEN  Inhale 2 L/min into the lungs continuous.     pantoprazole  (PROTONIX ) 40 MG tablet Take 1 tablet (40 mg total) by mouth daily. 30 tablet 1   predniSONE  (DELTASONE ) 20 MG tablet Take 20 mg by mouth.     VRAYLAR  6 MG CAPS Take 1 capsule by mouth daily.     No current facility-administered medications for this visit.    Allergies  Allergen Reactions   Lisinopril Cough   Losartan Cough   Other Other (See Comments)    Perfumes : Congestion    Family History  Problem Relation Age of Onset   Diabetes Mother    Breast cancer Sister     Social History   Socioeconomic History   Marital status: Single    Spouse name: Not on file   Number of children: Not on file   Years of education: Not on file   Highest education level: Not on file  Occupational History   Not on file  Tobacco Use   Smoking status: Former    Current packs/day: 0.50    Average packs/day: 0.5 packs/day for  35.3 years (17.7 ttl pk-yrs)    Types: Cigarettes    Start date: 09/12/2021   Smokeless tobacco: Never  Vaping Use   Vaping status: Never Used  Substance and Sexual Activity   Alcohol use: No   Drug use: Yes    Frequency: 7.0 times per week    Types: Marijuana   Sexual activity: Not on file  Other Topics Concern   Not on file  Social History Narrative   Not on file   Social Drivers of Health   Financial Resource Strain: Low Risk  (07/15/2023)   Received from [facility]   Overall Financial Resource Strain (CARDIA)    Difficulty of Paying Living Expenses: Not hard at all  Food Insecurity: No Food Insecurity (07/15/2023)   Received from [facility]   Hunger Vital Sign    Worried About Running Out of Food in the Last Year: Never true    Ran Out of Food in the Last Year: Never true   Transportation Needs: No Transportation Needs (07/15/2023)   Received from [facility] - Transportation    In the past 12 months, has lack of transportation kept you from medical appointments or from getting medications?: No    Lack of Transportation (Non-Medical): No  Physical Activity: Inactive (06/11/2023)   Exercise Vital Sign    Days of Exercise per Week: 0 days    Minutes of Exercise per Session: 0 min  Stress: No Stress Concern Present (12/25/2022)   [employer] of Occupational Health - Occupational Stress Questionnaire    Feeling of Stress : Not at all  Social Connections: Socially Isolated (06/11/2023)   Social Connection and Isolation Panel [NHANES]    Frequency of Communication with Friends and Family: More than three times a week    Frequency of Social Gatherings with Friends and Family: Once a week    Attends Religious Services: Never    Database Administrator or Organizations: No    Attends Banker Meetings: Never    Marital Status: Never married  Intimate Partner Violence: Not At Risk (07/03/2023)   Humiliation, Afraid, Rape, and Kick questionnaire    Fear of Current or Ex-Partner: No    Emotionally Abused: No    Physically Abused: No    Sexually Abused: No     Constitutional: Denies fever, malaise, fatigue, headache or abrupt weight changes.  HEENT: Denies eye pain, eye redness, ear pain, ringing in the ears, wax buildup, runny nose, nasal congestion, bloody nose, or sore throat. Respiratory: Denies difficulty breathing, shortness of breath, cough or sputum production.   Cardiovascular: Denies chest pain, chest tightness, palpitations or swelling in the hands or feet.  Gastrointestinal: Denies abdominal pain, bloating, constipation, diarrhea or blood in the stool.  GU: Denies urgency, frequency, pain with urination, burning sensation, blood in urine, odor or discharge. Musculoskeletal: Patient reports chronic back pain.   Denies decrease in range of motion, difficulty with gait, or joint swelling.  Skin: Denies redness, rashes, lesions or ulcercations.  Neurological: Denies dizziness, difficulty with memory, difficulty with speech or problems with balance and coordination.  Psych: Patient has a history of anxiety and depression.  Denies SI/HI.  No other specific complaints in a complete review of systems (except as listed in HPI above).  Objective:   Physical Exam BP 120/88 (BP Location: Left Arm, Patient Position: Sitting, Cuff Size: Large)   Ht 5' 5 (1.651 m)   Wt 238 lb (  108 kg)   BMI 39.61 kg/m    Wt Readings from Last 3 Encounters:  07/22/23 229 lb (103.9 kg)  07/10/23 228 lb (103.4 kg)  06/20/23 230 lb 8 oz (104.6 kg)    General: Appears her stated age, obese, in NAD. Skin: Warm, dry and intact.  HEENT: Head: normal shape and size; Eyes: sclera white, no icterus, conjunctiva pink, PERRLA and EOMs intact;  Neck:  Neck supple, trachea midline. No masses, lumps or thyromegaly present.  Cardiovascular: Normal rate and rhythm. S1,S2 noted.  No murmur, rubs or gallops noted. No JVD or BLE edema. No carotid bruits noted. Pulmonary/Chest: Normal effort and positive vesicular breath sounds. No respiratory distress. No wheezes, rales or ronchi noted.  Abdomen:  Normal bowel sounds.  Musculoskeletal: Strength 5/5 BUE/BLE.  No difficulty with gait.  Neurological: Alert and oriented. Cranial nerves II-XII grossly intact. Coordination normal.  Psychiatric: Mood and affect normal. Behavior is normal. Judgment and thought content normal.     BMET    Component Value Date/Time   NA 138 07/10/2023 1443   K 3.6 07/10/2023 1443   CL 101 07/10/2023 1443   CO2 26 07/10/2023 1443   GLUCOSE 143 (H) 07/10/2023 1443   BUN 6 07/10/2023 1443   CREATININE 0.72 07/10/2023 1443   CREATININE 0.79 01/28/2023 0000   CALCIUM  9.1 07/10/2023 1443   GFRNONAA >60 07/10/2023 1443   GFRNONAA 74 08/17/2020 0814   GFRAA  86 08/17/2020 0814    Lipid Panel     Component Value Date/Time   CHOL 169 06/22/2023 0846   TRIG 147 06/22/2023 0846   HDL 54 06/22/2023 0846   CHOLHDL 3.1 06/22/2023 0846   VLDL 29 06/22/2023 0846   LDLCALC 86 06/22/2023 0846   LDLCALC 84 01/28/2023 0000    CBC    Component Value Date/Time   WBC 14.2 (H) 07/10/2023 1443   RBC 5.50 (H) 07/10/2023 1443   HGB 12.9 07/10/2023 1443   HCT 42.9 07/10/2023 1443   PLT 427 (H) 07/10/2023 1443   MCV 78.0 (L) 07/10/2023 1443   MCH 23.5 (L) 07/10/2023 1443   MCHC 30.1 07/10/2023 1443   RDW 18.1 (H) 07/10/2023 1443   LYMPHSABS 2.2 06/19/2023 1039   MONOABS 0.6 06/19/2023 1039   EOSABS 0.6 (H) 06/19/2023 1039   BASOSABS 0.1 06/19/2023 1039    Hgb A1C Lab Results  Component Value Date   HGBA1C 6.3 (H) 01/28/2023            Assessment & Plan:   Preventative Health Maintenance:  Flu shot UTD She declines tetanus for financial reasons, advised if she gets bit or cut to go get this done Encouraged her to get her COVID booster Discussed Shingrix vaccine, she will check coverage with her insurance company and schedule a visit at the pharmacy if she would like to have this done Pap smear UTD Mammogram UTD Colon screening UTD Encouraged her to consume a balanced diet and exercise regimen Advised her to see an eye doctor and dentist annually We will check CBC, c-Met, lipid and A1c today  RTC in 6 months, follow-up chronic conditions Angeline Laura, NP

## 2023-08-20 LAB — COMPLETE METABOLIC PANEL WITH GFR
AG Ratio: 1.7 (calc) (ref 1.0–2.5)
ALT: 12 U/L (ref 6–29)
AST: 13 U/L (ref 10–35)
Albumin: 4.4 g/dL (ref 3.6–5.1)
Alkaline phosphatase (APISO): 91 U/L (ref 37–153)
BUN: 14 mg/dL (ref 7–25)
CO2: 32 mmol/L (ref 20–32)
Calcium: 10.1 mg/dL (ref 8.6–10.4)
Chloride: 103 mmol/L (ref 98–110)
Creat: 0.85 mg/dL (ref 0.50–1.03)
Globulin: 2.6 g/dL (ref 1.9–3.7)
Glucose, Bld: 84 mg/dL (ref 65–99)
Potassium: 4.2 mmol/L (ref 3.5–5.3)
Sodium: 144 mmol/L (ref 135–146)
Total Bilirubin: 0.5 mg/dL (ref 0.2–1.2)
Total Protein: 7 g/dL (ref 6.1–8.1)
eGFR: 80 mL/min/{1.73_m2} (ref >=60)

## 2023-08-20 LAB — CBC
HCT: 40 % (ref 35.0–45.0)
Hemoglobin: 11.8 g/dL (ref 11.7–15.5)
MCH: 22.4 pg — ABNORMAL LOW (ref 27.0–33.0)
MCHC: 29.5 g/dL — ABNORMAL LOW (ref 32.0–36.0)
MCV: 75.9 fL — ABNORMAL LOW (ref 80.0–100.0)
MPV: 11.2 fL (ref 7.5–12.5)
Platelets: 424 10*3/uL — ABNORMAL HIGH (ref 140–400)
RBC: 5.27 10*6/uL — ABNORMAL HIGH (ref 3.80–5.10)
RDW: 15.6 % — ABNORMAL HIGH (ref 11.0–15.0)
WBC: 12.8 10*3/uL — ABNORMAL HIGH (ref 3.8–10.8)

## 2023-08-20 LAB — LIPID PANEL
Cholesterol: 182 mg/dL (ref <200)
HDL: 65 mg/dL (ref >=50)
LDL Cholesterol (Calc): 96 mg/dL
Non-HDL Cholesterol (Calc): 117 mg/dL (ref <130)
Total CHOL/HDL Ratio: 2.8 (calc) (ref <5.0)
Triglycerides: 109 mg/dL (ref <150)

## 2023-08-20 LAB — HEMOGLOBIN A1C
Hgb A1c MFr Bld: 6.4 %{Hb} — ABNORMAL HIGH (ref <5.7)
Mean Plasma Glucose: 137 mg/dL
eAG (mmol/L): 7.6 mmol/L

## 2023-09-01 DIAGNOSIS — J4540 Moderate persistent asthma, uncomplicated: Secondary | ICD-10-CM | POA: Diagnosis not present

## 2023-09-05 DIAGNOSIS — J9601 Acute respiratory failure with hypoxia: Secondary | ICD-10-CM | POA: Diagnosis not present

## 2023-09-05 DIAGNOSIS — J40 Bronchitis, not specified as acute or chronic: Secondary | ICD-10-CM | POA: Diagnosis not present

## 2023-09-05 DIAGNOSIS — R0902 Hypoxemia: Secondary | ICD-10-CM | POA: Diagnosis not present

## 2023-09-05 DIAGNOSIS — E876 Hypokalemia: Secondary | ICD-10-CM | POA: Diagnosis not present

## 2023-09-15 DIAGNOSIS — J4540 Moderate persistent asthma, uncomplicated: Secondary | ICD-10-CM | POA: Diagnosis not present

## 2023-09-29 DIAGNOSIS — J4540 Moderate persistent asthma, uncomplicated: Secondary | ICD-10-CM | POA: Diagnosis not present

## 2023-10-06 DIAGNOSIS — J40 Bronchitis, not specified as acute or chronic: Secondary | ICD-10-CM | POA: Diagnosis not present

## 2023-10-06 DIAGNOSIS — E876 Hypokalemia: Secondary | ICD-10-CM | POA: Diagnosis not present

## 2023-10-06 DIAGNOSIS — R0902 Hypoxemia: Secondary | ICD-10-CM | POA: Diagnosis not present

## 2023-10-06 DIAGNOSIS — J9601 Acute respiratory failure with hypoxia: Secondary | ICD-10-CM | POA: Diagnosis not present

## 2023-10-09 DIAGNOSIS — J90 Pleural effusion, not elsewhere classified: Secondary | ICD-10-CM | POA: Diagnosis not present

## 2023-10-13 DIAGNOSIS — J4489 Other specified chronic obstructive pulmonary disease: Secondary | ICD-10-CM | POA: Diagnosis not present

## 2023-10-13 DIAGNOSIS — Z79899 Other long term (current) drug therapy: Secondary | ICD-10-CM | POA: Diagnosis not present

## 2023-10-13 DIAGNOSIS — J4540 Moderate persistent asthma, uncomplicated: Secondary | ICD-10-CM | POA: Diagnosis not present

## 2023-10-13 DIAGNOSIS — S2242XS Multiple fractures of ribs, left side, sequela: Secondary | ICD-10-CM | POA: Diagnosis not present

## 2023-10-17 ENCOUNTER — Ambulatory Visit: Attending: Otolaryngology

## 2023-10-17 DIAGNOSIS — G4710 Hypersomnia, unspecified: Secondary | ICD-10-CM | POA: Diagnosis not present

## 2023-10-17 DIAGNOSIS — G4733 Obstructive sleep apnea (adult) (pediatric): Secondary | ICD-10-CM | POA: Diagnosis not present

## 2023-10-22 DIAGNOSIS — G4730 Sleep apnea, unspecified: Secondary | ICD-10-CM | POA: Diagnosis not present

## 2023-10-23 DIAGNOSIS — G4730 Sleep apnea, unspecified: Secondary | ICD-10-CM | POA: Diagnosis not present

## 2023-10-24 IMAGING — MG MM DIGITAL SCREENING BILAT W/ TOMO AND CAD
8 of 14 series · 8 of 40 positions shown · non-contrast
Comparison: Previous exam(s).

ACR Breast Density Category a: The breast tissue is almost entirely
fatty.

CLINICAL DATA: Screening.

EXAM:
DIGITAL SCREENING BILATERAL MAMMOGRAM WITH TOMOSYNTHESIS AND CAD
TECHNIQUE: Bilateral screening digital craniocaudal and mediolateral oblique
mammograms were obtained. Bilateral screening digital breast
tomosynthesis was performed. The images were evaluated with
computer-aided detection.

[L CC synth-2D (1 of 2)]
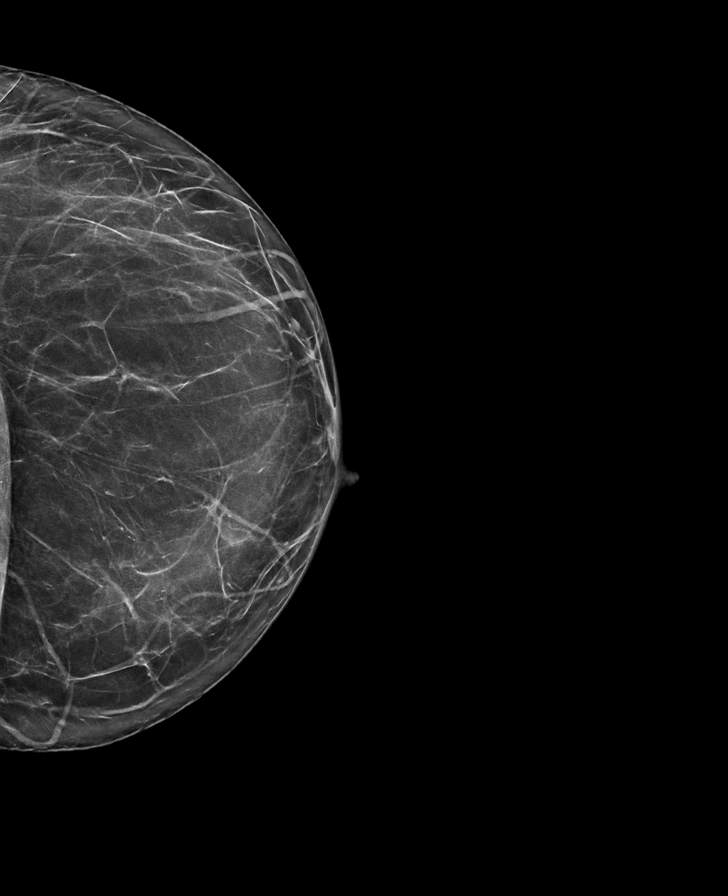

[L CC synth-2D (2 of 2)]
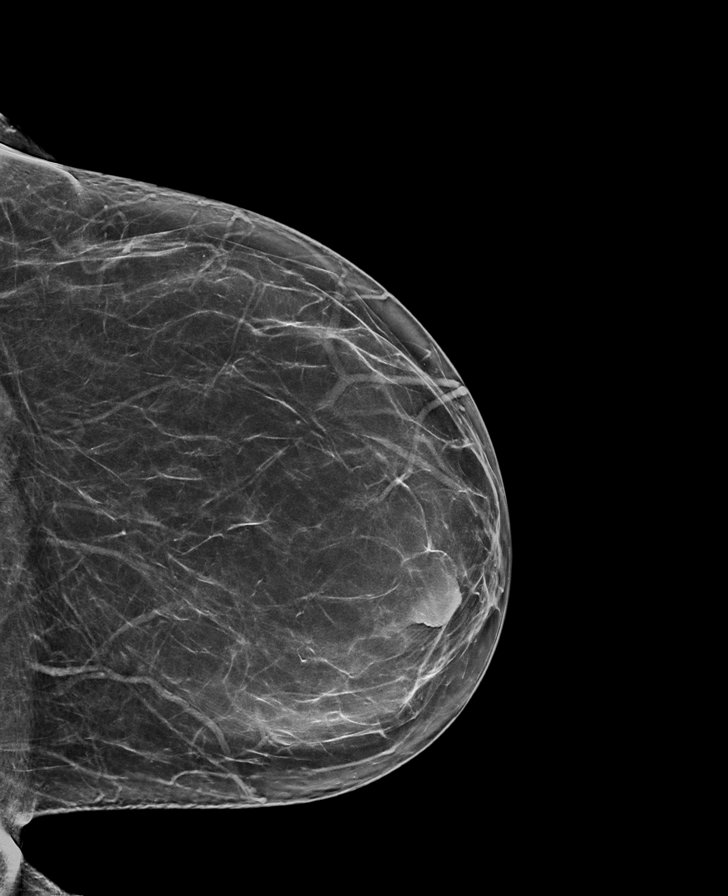

[R MLO synth-2D (1 of 2)]
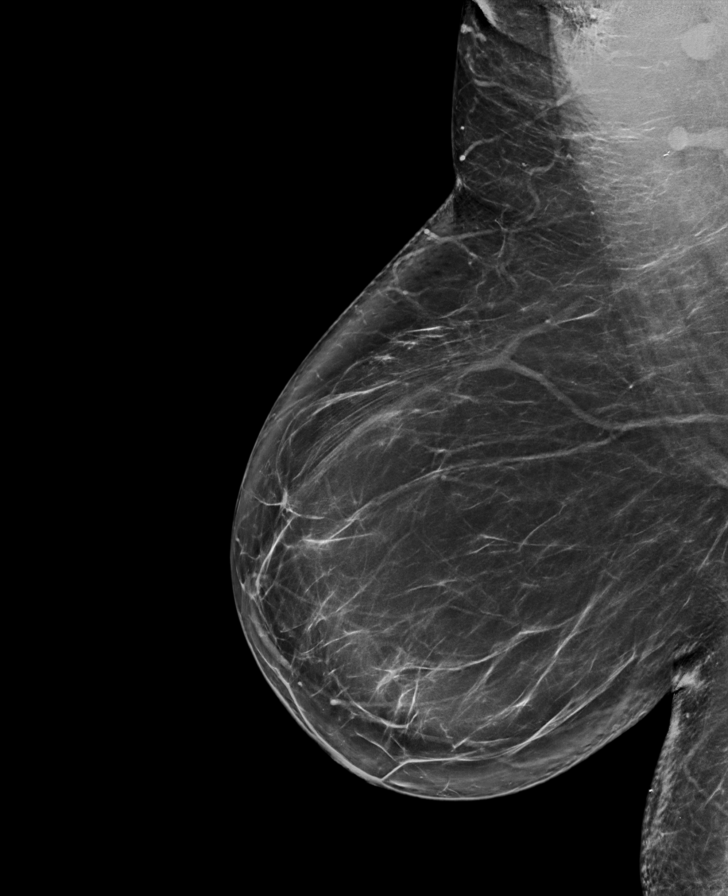

[R CC synth-2D (1 of 2)]
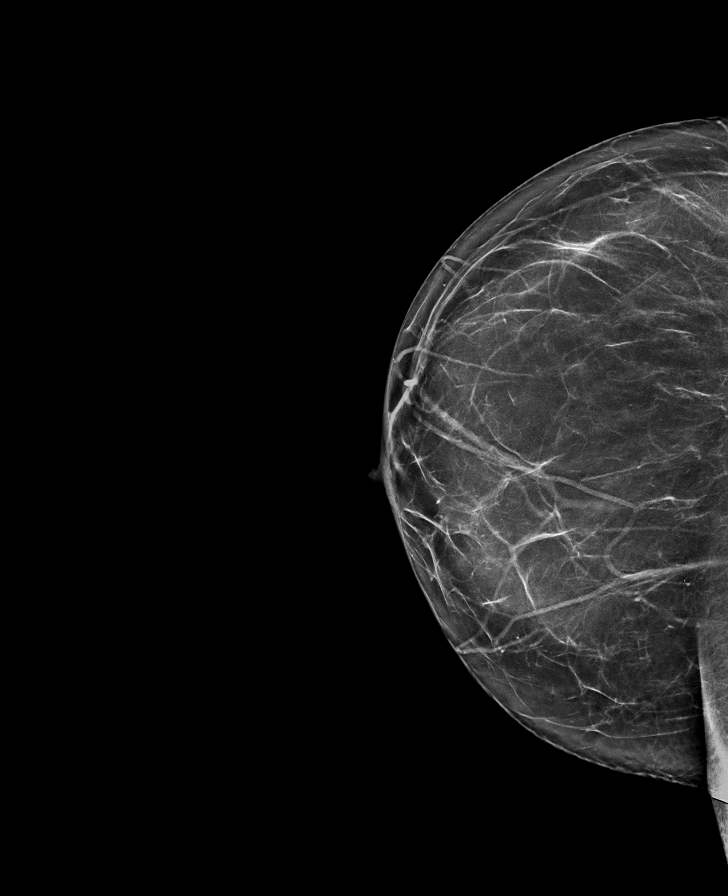

[L MLO synth-2D]
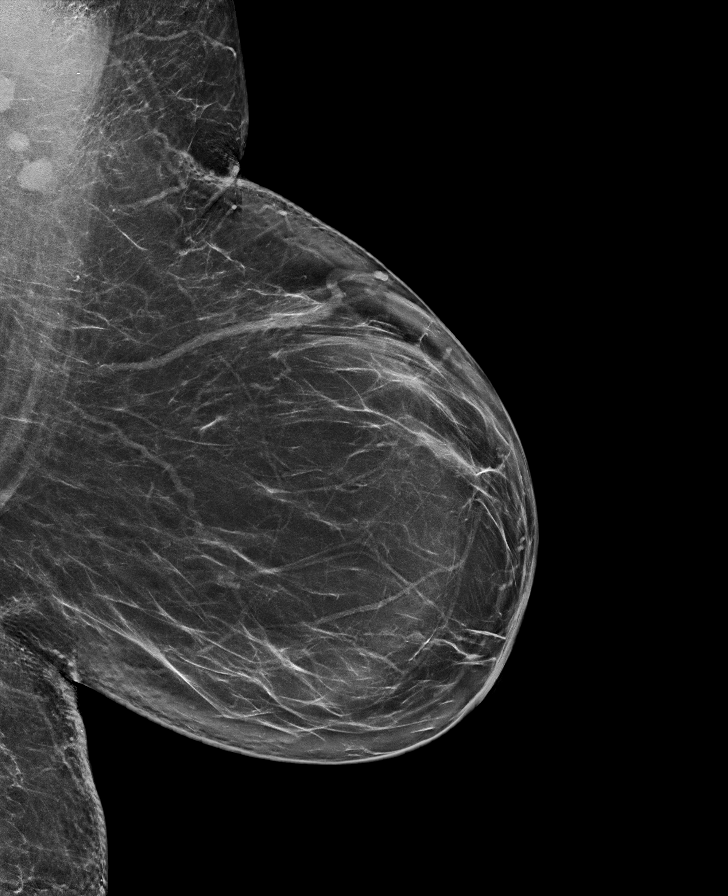

[R CC synth-2D (2 of 2)]
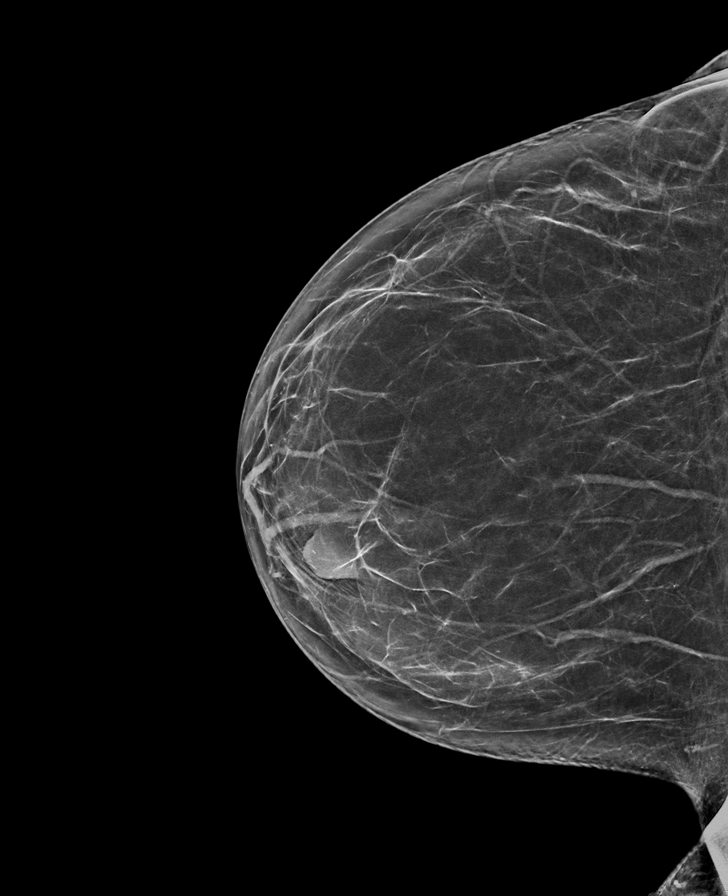

[R MLO synth-2D (2 of 2)]
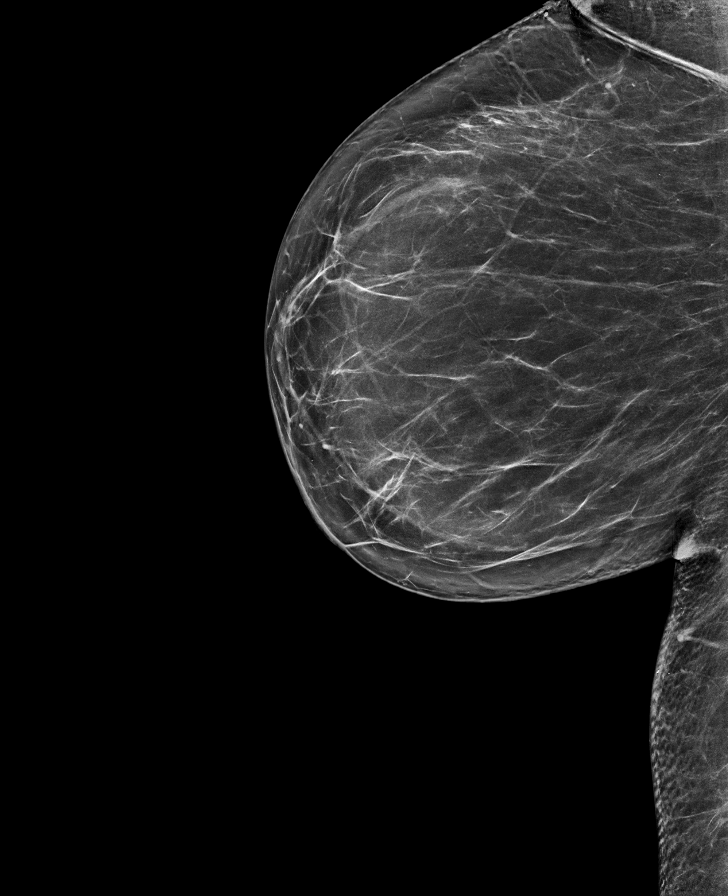

[L MLO tomo · tomo slice 47/92.0]
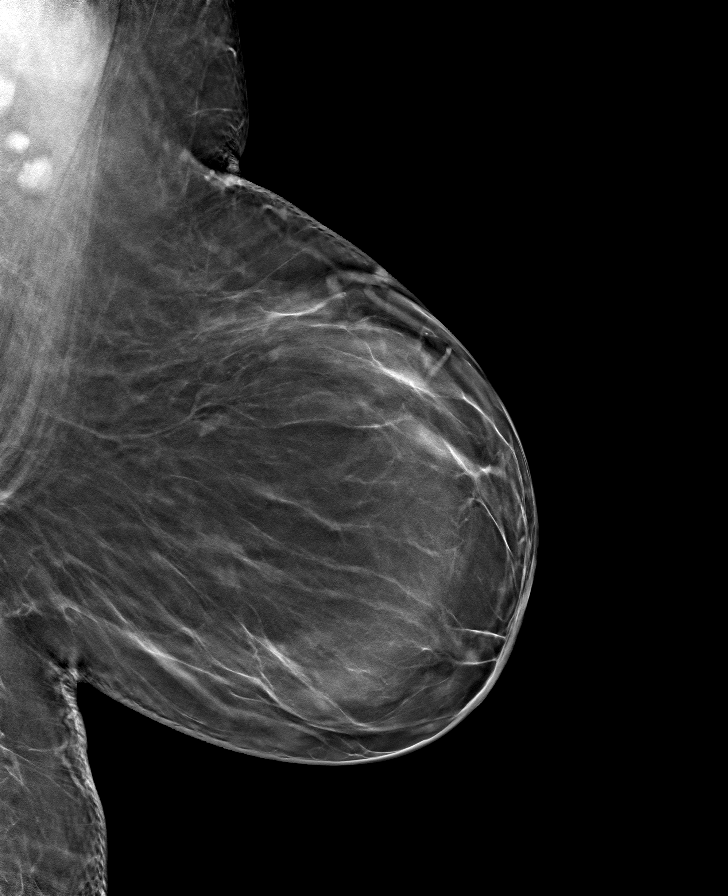

[8 of 40 positions shown; findings below may reference images not displayed]

FINDINGS: There are no findings suspicious for malignancy.
IMPRESSION: No mammographic evidence of malignancy. A result letter of this
screening mammogram will be mailed directly to the patient.

RECOMMENDATION:
Screening mammogram in one year. (Code:0E-3-N98)

BI-RADS CATEGORY  1: Negative.

## 2023-10-27 ENCOUNTER — Telehealth: Payer: Self-pay

## 2023-10-27 DIAGNOSIS — J4540 Moderate persistent asthma, uncomplicated: Secondary | ICD-10-CM | POA: Diagnosis not present

## 2023-10-27 NOTE — Telephone Encounter (Signed)
 Copied from CRM [id number]. Topic: Clinical - Lab/Test Results >> Oct 27, 2023 11:42 AM Courtney Carrillo wrote: Reason for CRM: patient calling to check on results oh her sleep study, they show not released. She would like a call back when the results are viewed to go over them.

## 2023-10-28 NOTE — Telephone Encounter (Signed)
 This was ordered by pulmonology.  She needs to call them for the results.  I am not sure why the results are not released, I can see the results in the system.

## 2023-11-03 DIAGNOSIS — E876 Hypokalemia: Secondary | ICD-10-CM | POA: Diagnosis not present

## 2023-11-03 DIAGNOSIS — R0902 Hypoxemia: Secondary | ICD-10-CM | POA: Diagnosis not present

## 2023-11-03 DIAGNOSIS — J40 Bronchitis, not specified as acute or chronic: Secondary | ICD-10-CM | POA: Diagnosis not present

## 2023-11-03 DIAGNOSIS — J9601 Acute respiratory failure with hypoxia: Secondary | ICD-10-CM | POA: Diagnosis not present

## 2023-11-07 DIAGNOSIS — J90 Pleural effusion, not elsewhere classified: Secondary | ICD-10-CM | POA: Diagnosis not present

## 2023-11-10 DIAGNOSIS — J4540 Moderate persistent asthma, uncomplicated: Secondary | ICD-10-CM | POA: Diagnosis not present

## 2023-11-24 DIAGNOSIS — J4540 Moderate persistent asthma, uncomplicated: Secondary | ICD-10-CM | POA: Diagnosis not present

## 2023-12-04 DIAGNOSIS — J40 Bronchitis, not specified as acute or chronic: Secondary | ICD-10-CM | POA: Diagnosis not present

## 2023-12-04 DIAGNOSIS — J9601 Acute respiratory failure with hypoxia: Secondary | ICD-10-CM | POA: Diagnosis not present

## 2023-12-04 DIAGNOSIS — E876 Hypokalemia: Secondary | ICD-10-CM | POA: Diagnosis not present

## 2023-12-04 DIAGNOSIS — R0902 Hypoxemia: Secondary | ICD-10-CM | POA: Diagnosis not present

## 2023-12-08 DIAGNOSIS — J90 Pleural effusion, not elsewhere classified: Secondary | ICD-10-CM | POA: Diagnosis not present

## 2023-12-09 DIAGNOSIS — J4540 Moderate persistent asthma, uncomplicated: Secondary | ICD-10-CM | POA: Diagnosis not present

## 2023-12-11 DIAGNOSIS — G4733 Obstructive sleep apnea (adult) (pediatric): Secondary | ICD-10-CM | POA: Diagnosis not present

## 2023-12-23 DIAGNOSIS — J4540 Moderate persistent asthma, uncomplicated: Secondary | ICD-10-CM | POA: Diagnosis not present

## 2023-12-28 ENCOUNTER — Other Ambulatory Visit: Payer: Self-pay | Admitting: Pulmonary Disease

## 2023-12-28 DIAGNOSIS — J4540 Moderate persistent asthma, uncomplicated: Secondary | ICD-10-CM

## 2024-01-01 ENCOUNTER — Ambulatory Visit (INDEPENDENT_AMBULATORY_CARE_PROVIDER_SITE_OTHER): Payer: 59

## 2024-01-01 DIAGNOSIS — Z0000 Encounter for general adult medical examination without abnormal findings: Secondary | ICD-10-CM | POA: Diagnosis not present

## 2024-01-01 DIAGNOSIS — Z1231 Encounter for screening mammogram for malignant neoplasm of breast: Secondary | ICD-10-CM

## 2024-01-01 DIAGNOSIS — Z1211 Encounter for screening for malignant neoplasm of colon: Secondary | ICD-10-CM

## 2024-01-01 NOTE — Progress Notes (Signed)
 Subjective:   Courtney Carrillo is a 58 y.o. who presents for a Medicare Wellness preventive visit.  As a reminder, Annual Wellness Visits don't include a physical exam, and some assessments may be limited, especially if this visit is performed virtually. We may recommend an in-person follow-up visit with your provider if needed.  Visit Complete: Virtual I connected with  Courtney Carrillo on 01/01/24 by a audio enabled telemedicine application and verified that I am speaking with the correct person using two identifiers.  Patient Location: Home  Provider Location: Office/Clinic  I discussed the limitations of evaluation and management by telemedicine. The patient expressed understanding and agreed to proceed.  Vital Signs: Because this visit was a virtual/telehealth visit, some criteria may be missing or patient reported. Any vitals not documented were not able to be obtained and vitals that have been documented are patient reported.  VideoDeclined- This patient declined Librarian, academic. Therefore the visit was completed with audio only.  Persons Participating in Visit: Patient.  AWV Questionnaire: No: Patient Medicare AWV questionnaire was not completed prior to this visit.  Cardiac Risk Factors include: advanced age (>61men, >45 women);hypertension;dyslipidemia;sedentary lifestyle;obesity (BMI >30kg/m2)     Objective:     There were no vitals filed for this visit. There is no height or weight on file to calculate BMI.     01/01/2024    3:55 PM 07/10/2023    2:41 PM 06/29/2023    6:54 PM 06/20/2023    2:43 PM 06/19/2023    2:50 PM 06/19/2023   10:35 AM 06/03/2023    3:00 PM  Advanced Directives  Does Patient Have a Medical Advance Directive? No No No No No No   Would patient like information on creating a medical advance directive? No - Patient declined No - Patient declined No - Patient declined No - Patient declined   No - Patient declined     Current Medications (verified) Outpatient Encounter Medications as of 01/01/2024  Medication Sig   albuterol  (PROVENTIL ) (2.5 MG/3ML) 0.083% nebulizer solution Take 3 mLs (2.5 mg total) by nebulization every 4 (four) hours as needed for wheezing or shortness of breath.   amLODipine  (NORVASC ) 10 MG tablet Take 1 tablet (10 mg total) by mouth daily.   atorvastatin  (LIPITOR) 40 MG tablet TAKE 1 TABLET(40 MG) BY MOUTH EVERY MORNING (Patient taking differently: Take 40 mg by mouth daily.)   benztropine  (COGENTIN ) 1 MG tablet Take 1 mg by mouth every morning.   busPIRone  (BUSPAR ) 10 MG tablet Take 20 mg by mouth 2 (two) times daily.   COMBIVENT RESPIMAT 20-100 MCG/ACT AERS respimat Inhale 2 puffs into the lungs every 6 (six) hours as needed for wheezing or shortness of breath.   doxepin  (SINEQUAN ) 50 MG capsule Take 50 mg by mouth at bedtime.   Dupilumab 300 MG/2ML SOAJ Inject into the skin.   OXYGEN  Inhale 2 L/min into the lungs continuous.   VRAYLAR  6 MG CAPS Take 1 capsule by mouth daily.   acetaminophen -codeine  (TYLENOL  #3) 300-30 MG tablet Take 1 tablet by mouth every 8 (eight) hours as needed for moderate pain (pain score 4-6). (Patient not taking: Reported on 01/01/2024)   budesonide -formoterol  (SYMBICORT ) 160-4.5 MCG/ACT inhaler Inhale 2 puffs into the lungs 2 (two) times daily.   carvedilol  (COREG ) 3.125 MG tablet Take 1 tablet (3.125 mg total) by mouth 2 (two) times daily with a meal.   clonazePAM (KLONOPIN) 0.5 MG tablet Take 0.5 mg by mouth daily. (Patient not  taking: Reported on 01/01/2024)   pantoprazole  (PROTONIX ) 40 MG tablet Take 1 tablet (40 mg total) by mouth daily. (Patient not taking: Reported on 01/01/2024)   predniSONE  (DELTASONE ) 20 MG tablet Take 20 mg by mouth. (Patient not taking: Reported on 01/01/2024)   No facility-administered encounter medications on file as of 01/01/2024.    Allergies (verified) Lisinopril, Losartan, and Other   History: Past Medical History:   Diagnosis Date   Anemia    2015   Anxiety    COPD (chronic obstructive pulmonary disease) (HCC)    Depression    Headache    H/O   Hyperlipidemia    Hypertension    Sleep apnea    CPAP   Past Surgical History:  Procedure Laterality Date   BACK SURGERY     LUMBAR   CARPAL TUNNEL RELEASE Right 06/19/2015   Procedure: CARPAL TUNNEL RELEASE;  Surgeon: Molli Angelucci, MD;  Location: [facility] ORS;  Service: Orthopedics;  Laterality: Right;   COLONOSCOPY WITH PROPOFOL  N/A 01/28/2021   Procedure: COLONOSCOPY WITH PROPOFOL ;  Surgeon: Selena Daily, MD;  Location: [facility] ENDOSCOPY;  Service: Gastroenterology;  Laterality: N/A;   DILATION AND CURETTAGE OF UTERUS     FLEXIBLE SIGMOIDOSCOPY N/A 04/10/2021   Procedure: FLEXIBLE SIGMOIDOSCOPY;  Surgeon: Selena Daily, MD;  Location: [facility] ENDOSCOPY;  Service: Gastroenterology;  Laterality: N/A;   Family History  Problem Relation Age of Onset   Diabetes Mother    Breast cancer Sister    Social History   Socioeconomic History   Marital status: Single    Spouse name: Not on file   Number of children: Not on file   Years of education: Not on file   Highest education level: Not on file  Occupational History   Not on file  Tobacco Use   Smoking status: Former    Current packs/day: 0.50    Average packs/day: 0.5 packs/day for 35.7 years (17.9 ttl pk-yrs)    Types: Cigarettes    Start date: 09/12/2021   Smokeless tobacco: Never  Vaping Use   Vaping status: Never Used  Substance and Sexual Activity   Alcohol use: No   Drug use: Yes    Frequency: 7.0 times per week    Types: Marijuana   Sexual activity: Not on file  Other Topics Concern   Not on file  Social History Narrative   Not on file   Social Drivers of Health   Financial Resource Strain: Low Risk  (01/01/2024)   Overall Financial Resource Strain (CARDIA)    Difficulty of Paying Living Expenses: Not hard at all  Food Insecurity: No Food Insecurity (01/01/2024)   Hunger  Vital Sign    Worried About Running Out of Food in the Last Year: Never true    Ran Out of Food in the Last Year: Never true  Transportation Needs: No Transportation Needs (01/01/2024)   PRAPARE - Administrator, Civil Service (Medical): No    Lack of Transportation (Non-Medical): No  Physical Activity: Inactive (01/01/2024)   Exercise Vital Sign    Days of Exercise per Week: 0 days    Minutes of Exercise per Session: 0 min  Stress: No Stress Concern Present (01/01/2024)   [employer] of Occupational Health - Occupational Stress Questionnaire    Feeling of Stress : Only a little  Social Connections: Moderately Isolated (01/01/2024)   Social Connection and Isolation Panel [NHANES]    Frequency of Communication with Friends and Family: More than three times  a week    Frequency of Social Gatherings with Friends and Family: Once a week    Attends Religious Services: More than 4 times per year    Active Member of [employer] or Organizations: No    Attends Engineer, structural: Never    Marital Status: Never married    Tobacco Counseling Counseling given: Not Answered    Clinical Intake:  Pre-visit preparation completed: Yes  Pain : No/denies pain     BMI - recorded: 39.6 Nutritional Status: BMI > 30  Obese Nutritional Risks: None Diabetes: No  Lab Results  Component Value Date   HGBA1C 6.4 (H) 08/19/2023   HGBA1C 6.3 (H) 01/28/2023   HGBA1C 5.9 (H) 07/11/2022     How often do you need to have someone help you when you read instructions, pamphlets, or other written materials from your doctor or pharmacy?: 1 - Never  Interpreter Needed?: No  Information entered by :: Lawrie Tunks LPN   Activities of Daily Living    01/01/2024    3:56 PM 06/30/2023    7:00 AM  In your present state of health, do you have any difficulty performing the following activities:  Hearing? 0 0  Vision? 0 0  Difficulty concentrating or making decisions? 0 0  Walking or  climbing stairs? 1   Comment SHOB   Dressing or bathing? 0   Doing errands, shopping? 0 0  Preparing Food and eating ? N   Using the Toilet? N   In the past six months, have you accidently leaked urine? N   Do you have problems with loss of bowel control? N   Managing your Medications? N   Managing your Finances? N   Housekeeping or managing your Housekeeping? N     Patient Care Team: Carollynn Cirri, NP as PCP - [facility], [facility]  Indicate any recent Medical Services you may have received from other than [facility] providers in the past year (date may be approximate).     Assessment:    This is a routine wellness examination for Zayanna.  Hearing/Vision screen Hearing Screening - Comments:: NO AIDS Vision Screening - Comments:: READERS- [place]   Goals Addressed             This Visit's Progress    DIET - INCREASE WATER INTAKE         Depression Screen     01/01/2024    3:54 PM 08/19/2023    9:00 AM 07/22/2023    9:50 AM 06/17/2023   10:37 AM 01/28/2023    9:34 AM 12/25/2022    3:32 PM 07/11/2022    1:28 PM  PHQ 2/9 Scores  PHQ - 2 Score 0 0 0 0 6 0 3  PHQ- 9 Score 0 9 6  15  0 15    Fall Risk     01/01/2024    3:56 PM 08/19/2023    9:00 AM 07/22/2023    9:50 AM 06/17/2023   10:38 AM 01/28/2023    9:34 AM  Fall Risk   Falls in the past year? 0 0 1 1 0  Number falls in past yr: 0  0 0   Injury with Fall? 0  1 1 0  Risk for fall due to : No Fall Risks    No Fall Risks  Follow up Falls evaluation completed        MEDICARE RISK AT HOME:  Medicare Risk at Home Any stairs in  or around the home?: No If so, are there any without handrails?: No Home free of loose throw rugs in walkways, pet beds, electrical cords, etc?: Yes Adequate lighting in your home to reduce risk of falls?: Yes Life alert?: No Use of a cane, walker or w/c?: No Grab bars in the bathroom?: No Shower chair or bench in shower?: No Elevated toilet seat or a  handicapped toilet?: No  TIMED UP AND GO:  Was the test performed?  No  Cognitive Function: 6CIT completed        01/01/2024    3:58 PM 12/25/2022    3:38 PM 12/16/2021    1:54 PM  6CIT Screen  What Year? 0 points 0 points 0 points  What month? 0 points 0 points 0 points  What time? 3 points 0 points 0 points  Count back from 20 0 points 0 points 0 points  Months in reverse 0 points 0 points 0 points  Repeat phrase 0 points 0 points 0 points  Total Score 3 points 0 points 0 points    Immunizations Immunization History  Administered Date(s) Administered   Hepatitis B 06/11/2012   Hepatitis B, ADULT 06/11/2012, 07/25/2013   Influenza, Seasonal, Injecte, Preservative Fre 06/11/2012   Influenza,inj,Quad PF,6+ Mos 07/28/2014, 08/16/2015, 05/26/2016, 06/18/2017, 06/03/2018, 06/06/2019, 06/03/2021, 06/06/2022   Influenza-Unspecified 05/08/2011, 06/11/2012, 05/09/2013, 06/18/2017, 05/29/2020, 05/01/2023   MMR 05/08/2011, 07/04/2011   PFIZER(Purple Top)SARS-COV-2 Vaccination 11/04/2019, 11/25/2019, 07/11/2020   PNEUMOCOCCAL CONJUGATE-20 06/23/2023   PPD Test 07/25/2013   Tdap 06/11/2012   Varicella 05/08/2011    Screening Tests Health Maintenance  Topic Date Due   Zoster Vaccines- Shingrix (1 of 2) Never done   DTaP/Tdap/Td (2 - Td or Tdap) 06/11/2022   Cervical Cancer Screening (HPV/Pap Cotest)  12/14/2023   Colonoscopy  01/29/2024   INFLUENZA VACCINE  03/11/2024   Medicare Annual Wellness (AWV)  12/31/2024   MAMMOGRAM  02/17/2025   Pneumococcal Vaccine 80-83 Years old  Completed   Hepatitis C Screening  Completed   HIV Screening  Completed   HPV VACCINES  Aged Out   Meningococcal B Vaccine  Aged Out   COVID-19 Vaccine  Discontinued    Health Maintenance  Health Maintenance Due  Topic Date Due   Zoster Vaccines- Shingrix (1 of 2) Never done   DTaP/Tdap/Td (2 - Td or Tdap) 06/11/2022   Cervical Cancer Screening (HPV/Pap Cotest)  12/14/2023   Colonoscopy  01/29/2024    Health Maintenance Items Addressed: Referral sent to GI for colonoscopy. MAMMOGRAM ORDERED, COLONOSCOPY REFERRAL SENT; UP TO DATE ON SHOTS EXCEPT TDAP, COVID  Additional Screening:  Vision Screening: Recommended annual ophthalmology exams for early detection of glaucoma and other disorders of the eye.  Dental Screening: Recommended annual dental exams for proper oral hygiene  Community Resource Referral / Chronic Care Management: CRR required this visit?  No   CCM required this visit?  No   Plan:    I have personally reviewed and noted the following in the patient's chart:   Medical and social history Use of alcohol, tobacco or illicit drugs  Current medications and supplements including opioid prescriptions. Patient is not currently taking opioid prescriptions. Functional ability and status Nutritional status Physical activity Advanced directives List of other physicians Hospitalizations, surgeries, and ER visits in previous 12 months Vitals Screenings to include cognitive, depression, and falls Referrals and appointments  In addition, I have reviewed and discussed with patient certain preventive protocols, quality metrics, and best practice recommendations. A written personalized care  plan for preventive services as well as general preventive health recommendations were provided to patient.   Pinky Bright, LPN   1/61/0960   After Visit Summary: (MyChart) Due to this being a telephonic visit, the after visit summary with patients personalized plan was offered to patient via MyChart   Notes: REFERRALS SENT FOR MAMMOGRAM & COLONOSCOPY

## 2024-01-01 NOTE — Patient Instructions (Signed)
 Courtney Carrillo , Thank you for taking time out of your busy schedule to complete your Annual Wellness Visit with me. I enjoyed our conversation and look forward to speaking with you again next year. I, as well as your care team,  appreciate your ongoing commitment to your health goals. Please review the following plan we discussed and let me know if I can assist you in the future.   Referrals:  MAMMOGRAM & COLONOSCOPY REFERRALS SENT You have an order for:  []   2D Mammogram  [x]   3D Mammogram  []   Bone Density     Please call for appointment:  [facility]  [address] [place] [address] [phone] [facility] [address] [place], [place] [address] [phone] [facility] [address] Tracey Friday [place], [place] [address] [phone]   Make sure to wear two-piece clothing.  No lotions, powders, or deodorants the day of the appointment. Make sure to bring picture ID and insurance card.  Bring list of medications you are currently taking including any supplements.   Schedule your  screening mammogram through MyChart!   Log into your MyChart account.  Go to 'Visit' (or 'Appointments' if on mobile App) --> Schedule an Appointment  Under 'Select a Reason for Visit' choose the Mammogram Screening option.  Complete the pre-visit questions and select the time and place that best fits your schedule.   Follow up Visits: Next Medicare AWV with our clinical staff:   01/13/25 @ 4:00 PM BY PHONE Have you seen your provider in the last 6 months (3 months if uncontrolled diabetes)? Yes  Clinician Recommendations:  Aim for 30 minutes of exercise or brisk walking, 6-8 glasses of water, and 5 servings of fruits and vegetables each day. TAKE CARE!      This is a list of the screening recommended for you and due dates:  Health Maintenance  Topic Date Due    Zoster (Shingles) Vaccine (1 of 2) Never done   DTaP/Tdap/Td vaccine (2 - Td or Tdap) 06/11/2022   Pap with HPV screening  12/14/2023   Colon Cancer Screening  01/29/2024   Flu Shot  03/11/2024   Medicare Annual Wellness Visit  12/31/2024   Mammogram  02/17/2025   Pneumococcal Vaccination  Completed   Hepatitis C Screening  Completed   HIV Screening  Completed   HPV Vaccine  Aged Out   Meningitis B Vaccine  Aged Out   COVID-19 Vaccine  Discontinued    Advanced directives: (ACP Link)Information on Advanced Care Planning can be found at [place]  Secretary of [facility] Advance Health Care Directives Advance Health Care Directives. [email or web address]  Advance Care Planning is important because it:  [x]  Makes sure you receive the medical care that is consistent with your values, goals, and preferences  [x]  It provides guidance to your family and loved ones and reduces their decisional burden about whether or not they are making the right decisions based on your wishes.  Follow the link provided in your after visit summary or read over the paperwork we have mailed to you to help you started getting your Advance Directives in place. If you need assistance in completing these, please reach out to us  so that we can help you!

## 2024-01-05 ENCOUNTER — Ambulatory Visit
Admission: RE | Admit: 2024-01-05 | Discharge: 2024-01-05 | Disposition: A | Discharge status: Home / Self Care | Source: Ambulatory Visit | Attending: Pulmonary Disease | Admitting: Pulmonary Disease

## 2024-01-05 DIAGNOSIS — I2510 Atherosclerotic heart disease of native coronary artery without angina pectoris: Secondary | ICD-10-CM | POA: Diagnosis not present

## 2024-01-05 DIAGNOSIS — J4540 Moderate persistent asthma, uncomplicated: Secondary | ICD-10-CM | POA: Diagnosis not present

## 2024-01-05 DIAGNOSIS — J449 Chronic obstructive pulmonary disease, unspecified: Secondary | ICD-10-CM | POA: Diagnosis not present

## 2024-01-05 DIAGNOSIS — I700 Atherosclerosis of aorta: Secondary | ICD-10-CM | POA: Diagnosis not present

## 2024-01-05 DIAGNOSIS — J4489 Other specified chronic obstructive pulmonary disease: Secondary | ICD-10-CM | POA: Diagnosis not present

## 2024-01-07 DIAGNOSIS — J90 Pleural effusion, not elsewhere classified: Secondary | ICD-10-CM | POA: Diagnosis not present

## 2024-01-07 DIAGNOSIS — J4540 Moderate persistent asthma, uncomplicated: Secondary | ICD-10-CM | POA: Diagnosis not present

## 2024-01-21 DIAGNOSIS — J4540 Moderate persistent asthma, uncomplicated: Secondary | ICD-10-CM | POA: Diagnosis not present

## 2024-01-28 DIAGNOSIS — J9601 Acute respiratory failure with hypoxia: Secondary | ICD-10-CM | POA: Diagnosis not present

## 2024-01-28 DIAGNOSIS — R0902 Hypoxemia: Secondary | ICD-10-CM | POA: Diagnosis not present

## 2024-01-28 DIAGNOSIS — E876 Hypokalemia: Secondary | ICD-10-CM | POA: Diagnosis not present

## 2024-01-28 DIAGNOSIS — G4733 Obstructive sleep apnea (adult) (pediatric): Secondary | ICD-10-CM | POA: Diagnosis not present

## 2024-02-05 DIAGNOSIS — G4733 Obstructive sleep apnea (adult) (pediatric): Secondary | ICD-10-CM | POA: Diagnosis not present

## 2024-02-08 ENCOUNTER — Ambulatory Visit: Payer: Self-pay

## 2024-02-08 NOTE — Telephone Encounter (Signed)
 Agree with advice given. Ok to schedule an appt with me if she has availability around her work schedule

## 2024-02-08 NOTE — Telephone Encounter (Signed)
 FYI Only or Action Required?: FYI only for provider.  Patient was last seen in primary care on 08/19/2023 by Antonette Angeline ORN, NP. Called Nurse Triage reporting Joint Swelling. Symptoms began several Tortorelli ago. Interventions attempted: Rest, hydration, or home remedies and Ice/heat application. Symptoms are: stable.  Triage Disposition: See PCP When Office is Open (Within 3 Days)- Pt unable to schedule to due work schedule. RN advising Urgent Care or Mobile Medicine clinic  Patient/caregiver understands and will follow disposition?: Yes         Copied from CRM [id number]. Topic: Clinical - Red Word Triage >> Feb 08, 2024 12:01 PM Delon HERO wrote: Red Word that prompted transfer to Nurse Triage: Patient is calling to report that her Left & right ankles are swollen with itching, no pain. Patient is wearing compression socks and elevating when she gets home. Reason for Disposition  MILD or MODERATE ankle swelling (e.g., can't move joint normally, can't do usual activities) (Exceptions: Itchy, localized swelling; swelling is chronic.)  Answer Assessment - Initial Assessment Questions 1. LOCATION: Which ankle is swollen? Where is the swelling?     Left and right ankles, start at ankle, stops at mid-calf  2. ONSET: When did the swelling start?     Started 2 Dovidio ago  3. SWELLING: How bad is the swelling? Or, How large is it? (e.g., mild, moderate, severe; size of localized swelling)    - NONE: No joint swelling.   - LOCALIZED: Localized; small area of puffy or swollen skin (e.g., insect bite, skin irritation).   - MILD: Joint looks or feels mildly swollen or puffy.   - MODERATE: Swollen; interferes with normal activities (e.g., work or school); decreased range of movement; may be limping.   - SEVERE: Very swollen; can't move swollen joint at all; limping a lot or unable to walk.     Mild swelling, no limping.   4. PAIN: Is there any pain? If Yes, ask: How bad is it? (Scale  1-10; or mild, moderate, severe)   - NONE (0): no pain.   - MILD (1-3): doesn't interfere with normal activities.    - MODERATE (4-7): interferes with normal activities (e.g., work or school) or awakens from sleep, limping.    - SEVERE (8-10): excruciating pain, unable to do any normal activities, unable to walk.      No  5. CAUSE: What do you think caused the ankle swelling?     Thought it was due to standing at work all day  6. OTHER SYMPTOMS: Do you have any other symptoms? (e.g., fever, chest pain, difficulty breathing, calf pain)     No other symptoms  7. PREGNANCY: Is there any chance you are pregnant? When was your last menstrual period?     No  Protocols used: Ankle Swelling-A-AH

## 2024-02-09 DIAGNOSIS — J4540 Moderate persistent asthma, uncomplicated: Secondary | ICD-10-CM | POA: Diagnosis not present

## 2024-02-09 DIAGNOSIS — G4733 Obstructive sleep apnea (adult) (pediatric): Secondary | ICD-10-CM | POA: Diagnosis not present

## 2024-02-17 ENCOUNTER — Ambulatory Visit: Payer: Self-pay | Admitting: Internal Medicine

## 2024-02-23 DIAGNOSIS — J4540 Moderate persistent asthma, uncomplicated: Secondary | ICD-10-CM | POA: Diagnosis not present

## 2024-02-29 ENCOUNTER — Other Ambulatory Visit: Payer: Self-pay

## 2024-02-29 ENCOUNTER — Encounter: Payer: Self-pay | Admitting: Internal Medicine

## 2024-02-29 ENCOUNTER — Ambulatory Visit: Admitting: Internal Medicine

## 2024-02-29 VITALS — BP 128/80 | Ht 65.0 in | Wt 242.6 lb

## 2024-02-29 DIAGNOSIS — J45909 Unspecified asthma, uncomplicated: Secondary | ICD-10-CM | POA: Insufficient documentation

## 2024-02-29 DIAGNOSIS — E785 Hyperlipidemia, unspecified: Secondary | ICD-10-CM | POA: Diagnosis not present

## 2024-02-29 DIAGNOSIS — G8929 Other chronic pain: Secondary | ICD-10-CM | POA: Diagnosis not present

## 2024-02-29 DIAGNOSIS — G4733 Obstructive sleep apnea (adult) (pediatric): Secondary | ICD-10-CM

## 2024-02-29 DIAGNOSIS — M545 Low back pain, unspecified: Secondary | ICD-10-CM

## 2024-02-29 DIAGNOSIS — R7303 Prediabetes: Secondary | ICD-10-CM

## 2024-02-29 DIAGNOSIS — F32A Depression, unspecified: Secondary | ICD-10-CM

## 2024-02-29 DIAGNOSIS — I10 Essential (primary) hypertension: Secondary | ICD-10-CM | POA: Diagnosis not present

## 2024-02-29 DIAGNOSIS — J4540 Moderate persistent asthma, uncomplicated: Secondary | ICD-10-CM | POA: Diagnosis not present

## 2024-02-29 DIAGNOSIS — F419 Anxiety disorder, unspecified: Secondary | ICD-10-CM

## 2024-02-29 DIAGNOSIS — I700 Atherosclerosis of aorta: Secondary | ICD-10-CM

## 2024-02-29 DIAGNOSIS — D75839 Thrombocytosis, unspecified: Secondary | ICD-10-CM | POA: Diagnosis not present

## 2024-02-29 DIAGNOSIS — E66813 Obesity, class 3: Secondary | ICD-10-CM

## 2024-02-29 MED ORDER — ASPIRIN 81 MG PO TBEC
81.0000 mg | DELAYED_RELEASE_TABLET | Freq: Every day | ORAL | 1 refills | Status: AC
Start: 2024-02-29 — End: ?

## 2024-02-29 MED ORDER — CELECOXIB 50 MG PO CAPS
50.0000 mg | ORAL_CAPSULE | Freq: Two times a day (BID) | ORAL | 1 refills | Status: AC
Start: 2024-02-29 — End: ?

## 2024-02-29 NOTE — Assessment & Plan Note (Signed)
 Continue vraylar  6 mg, cogentin  1 mg, doxepin  50 mg and buspirone  20 mg per psychiatry Support offered

## 2024-02-29 NOTE — Assessment & Plan Note (Signed)
 A1c today Encourage low-carb diet and exercise for weight loss

## 2024-02-29 NOTE — Assessment & Plan Note (Signed)
 Continue combivent 20-100 mcg and yupelri as previously prescribed She will continue to follow with pulmonology

## 2024-02-29 NOTE — Patient Instructions (Signed)
Thank you for allowing the Chronic Care Management team to participate in your care.  

## 2024-02-29 NOTE — Assessment & Plan Note (Signed)
 C-Met and lipid profile today Encouraged her to consume low-fat diet Continue atorvastatin  40 mg daily

## 2024-02-29 NOTE — Assessment & Plan Note (Signed)
 CBC today We will start aspirin  81 mg daily

## 2024-02-29 NOTE — Assessment & Plan Note (Signed)
 Encourage diet and exercise for weight loss

## 2024-02-29 NOTE — Assessment & Plan Note (Signed)
 Encouraged regular stretching and core strengthening Will trial celebrex  50 mg twice daily

## 2024-02-29 NOTE — Assessment & Plan Note (Signed)
 Controlled on amlodipine  10 mg daily Reinforced DASH diet and exercise for weight loss C-Met today

## 2024-02-29 NOTE — Assessment & Plan Note (Signed)
 C-Met and lipid profile today Encouraged her to consume low-fat diet Continue atorvastatin  40 mg daily Low start aspirin  81 mg daily

## 2024-02-29 NOTE — Assessment & Plan Note (Signed)
Noncompliant with CPAP Encouraged weight loss as this can reduce sleep apnea symptoms

## 2024-02-29 NOTE — Patient Outreach (Unsigned)
 Complex Care Management   Visit Note  02/29/2024  Name:  Courtney Carrillo MRN: [id number] DOB: [birth year]  Situation: Referral received for Complex Care Management related to {Criteria:32550} I obtained verbal consent from {CHL AMB Patient/Caregiver:28184}.  Visit completed with ***  {VISIT LOCATION:32553}  Background:   Past Medical History:  Diagnosis Date   Anemia    2015   Anxiety    COPD (chronic obstructive pulmonary disease) (HCC)    Depression    Headache    H/O   Hyperlipidemia    Hypertension    Sleep apnea    CPAP    Assessment: Patient Reported Symptoms:  Cognitive        Neurological      HEENT        Cardiovascular      Respiratory      Endocrine      Gastrointestinal        Genitourinary      Integumentary      Musculoskeletal          Psychosocial              02/29/2024    1:29 PM  Depression screen PHQ 2/9  Decreased Interest 3  Down, Depressed, Hopeless 3  PHQ - 2 Score 6  Altered sleeping 3  Tired, decreased energy 0  Change in appetite 0  Feeling bad or failure about yourself  0  Trouble concentrating 3  Moving slowly or fidgety/restless 0  Suicidal thoughts 0  PHQ-9 Score 12  Difficult doing work/chores Somewhat difficult    There were no vitals filed for this visit.  Medications Reviewed Today   Medications were not reviewed in this encounter     Recommendation:   {RECOMMENDATONS:32554}  Follow Up Plan:   {FOLLOWUP:32559}  SIG ***

## 2024-02-29 NOTE — Patient Instructions (Signed)
 Atherosclerosis  Atherosclerosis is when plaque builds up in the arteries. This causes narrowing and hardening of the arteries. Arteries are blood vessels that carry blood from the heart to all parts of the body. This blood contains oxygen. Plaque occurs due to inflammation or from a buildup of fat, cholesterol, calcium, waste products of cells, and a clotting material in the blood (fibrin). Plaque decreases the amount of blood that can flow through the artery. Atherosclerosis can affect any artery in your body, including: Heart arteries. Damage to these arteries may lead to coronary artery disease, which can cause a heart attack. Brain arteries. Damage to these arteries may cause a stroke. Leg, arm, and pelvis arteries. Peripheral artery disease (PAD) may result from damage to these arteries. Kidney arteries. Kidney (renal) failure may result from damage to kidney arteries. Treatment may slow the disease and prevent further damage to your heart, brain, peripheral arteries, and kidneys. What are the causes? This condition develops slowly over many years. The inner layers of your arteries become damaged and allow the gradual buildup of plaque. The exact cause of atherosclerosis is not fully understood. Symptoms of atherosclerosis do not occur until an artery becomes narrow or blocked. What increases the risk? The following factors may make you more likely to develop this condition: Being middle-aged or older. Certain medical conditions, including: High blood pressure. High cholesterol. High blood fats (triglycerides). Diabetes. Sleep apnea. Obesity. Certain lab levels, including: Elevated C-reactive protein (CRP). This is a sign of increased inflammation in your body. Elevated homocysteine levels. This is an amino acid that is associated with heart and blood vessel disease. Using tobacco or nicotine products. A family history of atherosclerosis. Not exercising enough (sedentary  lifestyle). Being stressed. Drinking too much alcohol or using drugs, such as cocaine or methamphetamine. What are the signs or symptoms? Symptoms of atherosclerosis do not occur until the plaque severely narrows or blocks the artery, which decreases blood flow. Sometimes, atherosclerosis does not cause symptoms. Symptoms of this condition include: Coronary artery disease. This may cause chest pain and shortness of breath. Decreased blood supply to your brain, which may cause a stroke. Signs of a stroke may include sudden: Weakness or numbness in your face, arm, or leg, especially on one side of your body. Trouble walking or difficulty moving your arms or legs. Loss of balance or coordination. Confusion. Slurred speech. Trouble speaking, or trouble understanding speech, or both (aphasia). Vision changes in one or both eyes. This may be double vision, blurred vision, or loss of vision. Severe headache with no known cause. The headache is often described as the worst headache ever experienced. PAD, which may cause pain, numbness, or nonhealing wounds, often in your legs and hips. Renal failure. This may cause tiredness, problems with urination, swelling, and itchy skin. How is this diagnosed? This condition is diagnosed based on your medical history and a physical exam. During the exam, your health care provider will: Check your pulse in different places. Listen for a "whooshing" sound over your arteries (bruit). You may also have tests, such as: Blood tests to check your levels of cholesterol, triglycerides, blood sugar, and CRP. Ankle-brachial index to compare blood pressure in your arms to blood pressure in your ankles to see how your blood is flowing. Heart (cardiac) tests. Electrocardiogram (ECG) to check for heart damage. Stress test to see how your heart reacts to exercise. Ultrasound tests. Ultrasound of your peripheral arteries to check blood flow. Echocardiogram to get images of  your heart's  chambers and valves. X-ray tests. Chest X-ray to see if you have an enlarged heart, which is a sign of heart failure. CT scan to check for damage to your heart, brain, or arteries. Angiogram. This is a test where dye is injected and X-rays are used to see the blood flow in the arteries. How is this treated? This condition is treated with lifestyle changes as the first step. These may include: Changing your diet. Losing weight. Reducing stress. Exercising and being physically active more regularly. Quitting smoking. You may also need medicine to: Lower triglycerides and cholesterol. Control blood pressure. Prevent blood clots. Lower inflammation in your body. Control your blood sugar. Sometimes, surgery is needed to: Remove plaque from an artery (endarterectomy). Open or widen a narrowed heart artery or peripheral artery (angioplasty). Create a new path for your blood with one of these procedures: Heart (coronary) artery bypass graft surgery. Peripheral artery bypass graft surgery. Place a small mesh tube (stent) in an artery to open or widen a narrowed artery. Follow these instructions at home: Eating and drinking  Eat a heart-healthy diet. Talk with your health care provider or a dietitian if you need help. A heart-healthy diet involves: Limiting unhealthy fats and increasing healthy fats. Some examples of healthy fats are avocados and olive oil. Eating plant-based foods, such as fruits, vegetables, nuts, whole grains, and legumes (such as peas and lentils). If you drink alcohol: Limit how much you have to: 0-1 drink a day for women who are not pregnant. 0-2 drinks a day for men. Know how much alcohol is in a drink. In the [place] one drink equals one 12 oz bottle of beer (355 mL), one 5 oz glass of wine (148 mL), or one 1 oz glass of hard liquor (44 mL). Lifestyle  Maintain a healthy weight. Lose weight if your health care provider says that you need to do  that. Follow an exercise program as told by your health care provider. Do not use any products that contain nicotine or tobacco. These products include cigarettes, chewing tobacco, and vaping devices, such as e-cigarettes. If you need help quitting, ask your health care provider. Do not use drugs. General instructions Take over-the-counter and prescription medicines only as told by your health care provider. Manage other health conditions as told. Keep all follow-up visits. This is important. Contact a health care provider if you have: An irregular heartbeat. Unexplained tiredness (fatigue). Trouble urinating, or you are producing less urine or foamy urine. Swelling of your hands or feet, or itchy skin. Unexplained pain or numbness in your legs or hips. A wound that is slow to heal or is not healing. Get help right away if: You have any symptoms of a heart attack. These may be: Chest pain. This includes squeezing chest pain that may feel like indigestion (angina). Shortness of breath. Pain in your neck, jaw, arms, back, or stomach. Cold sweat. Nausea. Light-headedness. Sudden pain, numbness, or coldness in a limb. You have any symptoms of a stroke. "BE FAST" is an easy way to remember the main warning signs of a stroke: B - Balance. Signs are dizziness, sudden trouble walking, or loss of balance. E - Eyes. Signs are trouble seeing or a sudden change in vision. F - Face. Signs are sudden weakness or numbness of the face, or the face or eyelid drooping on one side. A - Arms. Signs are weakness or numbness in an arm. This happens suddenly and usually on one side of the body. S -  Speech. Signs are sudden trouble speaking, slurred speech, or trouble understanding what people say. T - Time. Time to call emergency services. Write down what time symptoms started. You have other signs of a stroke, such as: A sudden, severe headache with no known cause. Nausea or vomiting. Seizure. These  symptoms may represent a serious problem that is an emergency. Do not wait to see if the symptoms will go away. Get medical help right away. Call your local emergency services (911 in the [place] Do not drive yourself to the hospital. Summary Atherosclerosis is when plaque builds up in the arteries and causes narrowing and hardening of the arteries. Plaque occurs due to inflammation or from a buildup of fat, cholesterol, calcium, cellular waste products, and fibrin. This condition may not cause any symptoms. Symptoms of atherosclerosis do not occur until the plaque severely narrows or blocks the artery. Treatment starts with lifestyle changes and may include medicines. In some cases, surgery is needed. Get help right away if you have any symptoms of a heart attack or stroke. This information is not intended to replace advice given to you by your health care provider. Make sure you discuss any questions you have with your health care provider. Document Revised: 10/31/2020 Document Reviewed: 10/31/2020 Elsevier Patient Education  2024 [employer].

## 2024-02-29 NOTE — Progress Notes (Signed)
 Subjective:    Patient ID: Courtney Carrillo, female    DOB: 07-21-1966, 58 y.o.   MRN: [id number]  HPI  Patient presents to clinic today for 77-month follow-up of chronic conditions.  HTN: Her BP today is 128/80.  She is taking amlodipine  as prescribed.  ECG from 06/2023 reviewed.  HLD with aortic atherosclerosis: Her last LDL was 96, triglycerides 890, 08/2023.  She denies myalgias on atorvastatin .  She is not currently taking any aspirin .  She does not consume a low-fat diet.  Anxiety and depression: Chronic, managed on vraylar , cogentin , doxepin , and buspirone .  She is not currently seeing a therapist but follows with psychiatry.  She denies SI/HI.  Chronic back pain: Status post fusion in 2002.  She does feel like her back pain has been worse lately.  She is not currently taking any medications for this but has used ibuprofen  in the past  She no longer follows with orthopedics.  OSA: She averages 2-4 hours of sleep per night with the use of her CPAP, but does get a few more ours of sleep without the CPAP.  Sleep study from 10/2023 reviewed.  Prediabetes: Her last A1c was 6.4%, 08/2023.  She is not taking any oral diabetic medication at this time.  She does not check her sugars.  Thrombocytosis: Her last platelet count was 424, 08/2023.  She does not follow with hematology.  Asthma: Moderate, persistent. She reports chronic shortness of breath. She is using combivent and yupelri nebulizer as prescribed. She follows with pulmonology.  Review of Systems  Past Medical History:  Diagnosis Date   Anemia    2015   Anxiety    COPD (chronic obstructive pulmonary disease) (HCC)    Depression    Headache    H/O   Hyperlipidemia    Hypertension    Sleep apnea    CPAP    Current Outpatient Medications  Medication Sig Dispense Refill   acetaminophen -codeine  (TYLENOL  #3) 300-30 MG tablet Take 1 tablet by mouth every 8 (eight) hours as needed for moderate pain (pain score 4-6). (Patient not  taking: Reported on 01/01/2024) 20 tablet 0   albuterol  (PROVENTIL ) (2.5 MG/3ML) 0.083% nebulizer solution Take 3 mLs (2.5 mg total) by nebulization every 4 (four) hours as needed for wheezing or shortness of breath. 75 mL 2   amLODipine  (NORVASC ) 10 MG tablet Take 1 tablet (10 mg total) by mouth daily. 30 tablet 11   atorvastatin  (LIPITOR) 40 MG tablet TAKE 1 TABLET(40 MG) BY MOUTH EVERY MORNING (Patient taking differently: Take 40 mg by mouth daily.) 90 tablet 2   benztropine  (COGENTIN ) 1 MG tablet Take 1 mg by mouth every morning.     budesonide -formoterol  (SYMBICORT ) 160-4.5 MCG/ACT inhaler Inhale 2 puffs into the lungs 2 (two) times daily. 1 each 12   busPIRone  (BUSPAR ) 10 MG tablet Take 20 mg by mouth 2 (two) times daily.     carvedilol  (COREG ) 3.125 MG tablet Take 1 tablet (3.125 mg total) by mouth 2 (two) times daily with a meal. 60 tablet 0   clonazePAM (KLONOPIN) 0.5 MG tablet Take 0.5 mg by mouth daily. (Patient not taking: Reported on 01/01/2024)     COMBIVENT RESPIMAT 20-100 MCG/ACT AERS respimat Inhale 2 puffs into the lungs every 6 (six) hours as needed for wheezing or shortness of breath.     doxepin  (SINEQUAN ) 50 MG capsule Take 50 mg by mouth at bedtime.     Dupilumab 300 MG/2ML SOAJ Inject into the skin.  OXYGEN  Inhale 2 L/min into the lungs continuous.     pantoprazole  (PROTONIX ) 40 MG tablet Take 1 tablet (40 mg total) by mouth daily. (Patient not taking: Reported on 01/01/2024) 30 tablet 1   predniSONE  (DELTASONE ) 20 MG tablet Take 20 mg by mouth. (Patient not taking: Reported on 01/01/2024)     VRAYLAR  6 MG CAPS Take 1 capsule by mouth daily.     No current facility-administered medications for this visit.    Allergies  Allergen Reactions   Lisinopril Cough   Losartan Cough   Other Other (See Comments)    Perfumes : Congestion    Family History  Problem Relation Age of Onset   Diabetes Mother    Breast cancer Sister     Social History   Socioeconomic History    Marital status: Single    Spouse name: Not on file   Number of children: Not on file   Years of education: Not on file   Highest education level: Not on file  Occupational History   Not on file  Tobacco Use   Smoking status: Former    Current packs/day: 0.50    Average packs/day: 0.5 packs/day for 35.9 years (17.9 ttl pk-yrs)    Types: Cigarettes    Start date: 09/12/2021   Smokeless tobacco: Never  Vaping Use   Vaping status: Never Used  Substance and Sexual Activity   Alcohol use: No   Drug use: Yes    Frequency: 7.0 times per week    Types: Marijuana   Sexual activity: Not on file  Other Topics Concern   Not on file  Social History Narrative   Not on file   Social Drivers of Health   Financial Resource Strain: Low Risk  (01/01/2024)   Overall Financial Resource Strain (CARDIA)    Difficulty of Paying Living Expenses: Not hard at all  Food Insecurity: No Food Insecurity (01/01/2024)   Hunger Vital Sign    Worried About Running Out of Food in the Last Year: Never true    Ran Out of Food in the Last Year: Never true  Transportation Needs: No Transportation Needs (01/01/2024)   PRAPARE - Administrator, Civil Service (Medical): No    Lack of Transportation (Non-Medical): No  Physical Activity: Inactive (01/01/2024)   Exercise Vital Sign    Days of Exercise per Week: 0 days    Minutes of Exercise per Session: 0 min  Stress: No Stress Concern Present (01/01/2024)   [employer] of Occupational Health - Occupational Stress Questionnaire    Feeling of Stress : Only a little  Social Connections: Moderately Isolated (01/01/2024)   Social Connection and Isolation Panel    Frequency of Communication with Friends and Family: More than three times a week    Frequency of Social Gatherings with Friends and Family: Once a week    Attends Religious Services: More than 4 times per year    Active Member of [employer] or Organizations: No    Attends Banker  Meetings: Never    Marital Status: Never married  Intimate Partner Violence: Not At Risk (01/01/2024)   Humiliation, Afraid, Rape, and Kick questionnaire    Fear of Current or Ex-Partner: No    Emotionally Abused: No    Physically Abused: No    Sexually Abused: No     Constitutional: Denies fever, malaise, fatigue, headache or abrupt weight changes.  HEENT: Denies eye pain, eye redness, ear pain, ringing in the ears, wax  buildup, runny nose, nasal congestion, bloody nose, or sore throat. Respiratory: Patient reports chronic shortness of breath.  Denies difficulty breathing, cough or sputum production.   Cardiovascular: Patient reports intermittent swelling in ankles.  Denies chest pain, chest tightness, palpitations or swelling in the hands.  Gastrointestinal: Denies abdominal pain, bloating, constipation, diarrhea or blood in the stool.  GU: Denies urgency, frequency, pain with urination, burning sensation, blood in urine, odor or discharge. Musculoskeletal: Patient reports chronic back pain.  Denies decrease in range of motion, difficulty with gait, or joint swelling.  Skin: Denies redness, rashes, lesions or ulcercations.  Neurological: Denies dizziness, difficulty with memory, difficulty with speech or problems with balance and coordination.  Psych: Patient has a history of anxiety and depression.  Denies SI/HI.  No other specific complaints in a complete review of systems (except as listed in HPI above).     Objective:   Physical Exam BP 128/80 (BP Location: Left Arm, Patient Position: Sitting, Cuff Size: Large)   Ht 5' 5 (1.651 m)   Wt 242 lb 9.6 oz (110 kg)   BMI 40.37 kg/m    Wt Readings from Last 3 Encounters:  08/19/23 238 lb (108 kg)  07/22/23 229 lb (103.9 kg)  07/10/23 228 lb (103.4 kg)    General: Appears her stated age, obese in NAD. Skin: Warm, dry and intact.  HEENT: Head: normal shape and size; Eyes: sclera white, no icterus, conjunctiva pink, PERRLA and  EOMs intact;  Cardiovascular: Normal rate and rhythm. S1,S2 noted.  No murmur, rubs or gallops noted. No JVD or BLE edema. No carotid bruits noted. Pulmonary/Chest: Normal effort and positive vesicular breath sounds. No respiratory distress. No wheezes, rales or ronchi noted.  Musculoskeletal: Pain with palpation over the lumbar spine.  No difficulty with gait.  Neurological: Alert and oriented.  Coordination normal.  Psychiatric: Mood and affect normal. Behavior is normal. Judgment and thought content normal.     BMET    Component Value Date/Time   NA 144 08/19/2023 0914   K 4.2 08/19/2023 0914   CL 103 08/19/2023 0914   CO2 32 08/19/2023 0914   GLUCOSE 84 08/19/2023 0914   BUN 14 08/19/2023 0914   CREATININE 0.85 08/19/2023 0914   CALCIUM  10.1 08/19/2023 0914   GFRNONAA >60 07/10/2023 1443   GFRNONAA 74 08/17/2020 0814   GFRAA 86 08/17/2020 0814    Lipid Panel     Component Value Date/Time   CHOL 182 08/19/2023 0914   TRIG 109 08/19/2023 0914   HDL 65 08/19/2023 0914   CHOLHDL 2.8 08/19/2023 0914   VLDL 29 06/22/2023 0846   LDLCALC 96 08/19/2023 0914    CBC    Component Value Date/Time   WBC 12.8 (H) 08/19/2023 0914   RBC 5.27 (H) 08/19/2023 0914   HGB 11.8 08/19/2023 0914   HCT 40.0 08/19/2023 0914   PLT 424 (H) 08/19/2023 0914   MCV 75.9 (L) 08/19/2023 0914   MCH 22.4 (L) 08/19/2023 0914   MCHC 29.5 (L) 08/19/2023 0914   RDW 15.6 (H) 08/19/2023 0914   LYMPHSABS 2.2 06/19/2023 1039   MONOABS 0.6 06/19/2023 1039   EOSABS 0.6 (H) 06/19/2023 1039   BASOSABS 0.1 06/19/2023 1039    Hgb A1C Lab Results  Component Value Date   HGBA1C 6.4 (H) 08/19/2023           Assessment & Plan:     RTC in 6 months for your annual exam Angeline Laura, NP

## 2024-03-01 ENCOUNTER — Ambulatory Visit: Payer: Self-pay | Admitting: Internal Medicine

## 2024-03-01 LAB — HEMOGLOBIN A1C
Hgb A1c MFr Bld: 6.3 % — ABNORMAL HIGH (ref <5.7)
Mean Plasma Glucose: 134 mg/dL
eAG (mmol/L): 7.4 mmol/L

## 2024-03-01 LAB — COMPREHENSIVE METABOLIC PANEL WITH GFR
AG Ratio: 1.7 (calc) (ref 1.0–2.5)
ALT: 9 U/L (ref 6–29)
AST: 12 U/L (ref 10–35)
Albumin: 4.3 g/dL (ref 3.6–5.1)
Alkaline phosphatase (APISO): 75 U/L (ref 37–153)
BUN: 7 mg/dL (ref 7–25)
CO2: 31 mmol/L (ref 20–32)
Calcium: 9.8 mg/dL (ref 8.6–10.4)
Chloride: 104 mmol/L (ref 98–110)
Creat: 0.82 mg/dL (ref 0.50–1.03)
Globulin: 2.6 g/dL (ref 1.9–3.7)
Glucose, Bld: 138 mg/dL — ABNORMAL HIGH (ref 65–99)
Potassium: 3.7 mmol/L (ref 3.5–5.3)
Sodium: 143 mmol/L (ref 135–146)
Total Bilirubin: 0.3 mg/dL (ref 0.2–1.2)
Total Protein: 6.9 g/dL (ref 6.1–8.1)
eGFR: 83 mL/min/1.73m2 (ref >=60)

## 2024-03-01 LAB — LIPID PANEL
Cholesterol: 151 mg/dL (ref <200)
HDL: 49 mg/dL — ABNORMAL LOW (ref >=50)
LDL Cholesterol (Calc): 82 mg/dL
Non-HDL Cholesterol (Calc): 102 mg/dL (ref <130)
Total CHOL/HDL Ratio: 3.1 (calc) (ref <5.0)
Triglycerides: 103 mg/dL (ref <150)

## 2024-03-01 LAB — CBC
HCT: 40 % (ref 35.0–45.0)
Hemoglobin: 11.9 g/dL (ref 11.7–15.5)
MCH: 22.2 pg — ABNORMAL LOW (ref 27.0–33.0)
MCHC: 29.8 g/dL — ABNORMAL LOW (ref 32.0–36.0)
MCV: 74.8 fL — ABNORMAL LOW (ref 80.0–100.0)
MPV: 11.2 fL (ref 7.5–12.5)
Platelets: 359 Thousand/uL (ref 140–400)
RBC: 5.35 Million/uL — ABNORMAL HIGH (ref 3.80–5.10)
RDW: 16 % — ABNORMAL HIGH (ref 11.0–15.0)
WBC: 8.9 Thousand/uL (ref 3.8–10.8)

## 2024-03-04 ENCOUNTER — Telehealth: Payer: Self-pay

## 2024-03-04 NOTE — Telephone Encounter (Signed)
 Tried calling patient call can not be completed at this time.

## 2024-03-04 NOTE — Telephone Encounter (Signed)
 There is no contraindication with her taking Celebrex  and having asthma.  Yes she can take it.

## 2024-03-04 NOTE — Telephone Encounter (Signed)
 Copied from CRM [id number]. Topic: Clinical - Prescription Issue >> Mar 04, 2024  9:49 AM Zebedee SAUNDERS wrote: Reason for CRM: Pt stated she has asthma and is prescribed celecoxib  (CELEBREX ) 50 MG capsule. She wants to know if she can continue taking this medication. Please call pt at [phone].

## 2024-03-08 DIAGNOSIS — J4540 Moderate persistent asthma, uncomplicated: Secondary | ICD-10-CM | POA: Diagnosis not present

## 2024-03-09 DIAGNOSIS — J441 Chronic obstructive pulmonary disease with (acute) exacerbation: Secondary | ICD-10-CM | POA: Diagnosis not present

## 2024-03-09 DIAGNOSIS — G4733 Obstructive sleep apnea (adult) (pediatric): Secondary | ICD-10-CM | POA: Diagnosis not present

## 2024-03-09 DIAGNOSIS — R7303 Prediabetes: Secondary | ICD-10-CM | POA: Diagnosis not present

## 2024-03-10 DIAGNOSIS — J40 Bronchitis, not specified as acute or chronic: Secondary | ICD-10-CM | POA: Diagnosis not present

## 2024-03-10 DIAGNOSIS — G4733 Obstructive sleep apnea (adult) (pediatric): Secondary | ICD-10-CM | POA: Diagnosis not present

## 2024-03-11 DIAGNOSIS — J9601 Acute respiratory failure with hypoxia: Secondary | ICD-10-CM | POA: Diagnosis not present

## 2024-03-11 DIAGNOSIS — G4733 Obstructive sleep apnea (adult) (pediatric): Secondary | ICD-10-CM | POA: Diagnosis not present

## 2024-03-11 DIAGNOSIS — R0902 Hypoxemia: Secondary | ICD-10-CM | POA: Diagnosis not present

## 2024-03-11 DIAGNOSIS — E876 Hypokalemia: Secondary | ICD-10-CM | POA: Diagnosis not present

## 2024-03-12 DIAGNOSIS — G4733 Obstructive sleep apnea (adult) (pediatric): Secondary | ICD-10-CM | POA: Diagnosis not present

## 2024-03-22 DIAGNOSIS — J441 Chronic obstructive pulmonary disease with (acute) exacerbation: Secondary | ICD-10-CM | POA: Diagnosis not present

## 2024-03-29 ENCOUNTER — Other Ambulatory Visit: Payer: Self-pay | Admitting: *Deleted

## 2024-03-29 ENCOUNTER — Other Ambulatory Visit: Payer: Self-pay

## 2024-03-30 NOTE — Patient Instructions (Signed)
 Visit Information  Thank you for taking time to visit with me today. Please don't hesitate to contact me if I can be of assistance to you before our next scheduled appointment.  Our next appointment is by telephone on 04/13/24 at 11am Please call the care guide team at [phone] if you need to cancel or reschedule your appointment.   Following is a copy of your care plan:   Goals Addressed             This Visit's Progress    VBCI Social Work Care Plan       Problems:   Grief and loss  CSW Clinical Goal(s):   Over the next 90 days the Patient will will follow up with grief counseling through Hospice and Palliative Care as directed by Social Work.  Interventions:  Mental Health:  Evaluation of current treatment plan related to Grief Confirmed recent loss of family member-stages of grief discussed-grief response normalized  Active listening / Reflection utilized Mining engineer reviewed Emotional Support Provided Motivational Interviewing employed PHQ2/PHQ9 completed GAD 7 completed  Solution-Focused Strategies employed: spending time with family, remaining adherent with medication regimen, considering grief counseling/grief share  Patient Goals/Self-Care Activities:  Patient to contact Grief Counseling  Plan:   Telephone follow up appointment with care management team member scheduled for:  04/13/24        Please call the Suicide and Crisis Lifeline: 988 call the [place]  National Suicide Prevention Lifeline: 281 452 3477 or TTY: 5744497436 TTY 986-097-0708) to talk to a trained counselor call 1-800-273-TALK (toll free, 24 hour hotline) if you are experiencing a Mental Health or Behavioral Health Crisis or need someone to talk to.  Patient verbalizes understanding of instructions and care plan provided today and agrees to view in MyChart. Active MyChart status and patient understanding of how to access instructions and care plan via MyChart confirmed with  patient.     Yida Hyams, LCSW [place]  [facility], [facility] Licensed Clinical Social Worker  Direct Dial: [phone]

## 2024-03-30 NOTE — Patient Outreach (Signed)
 Complex Care Management   Visit Note  03/30/2024  Name:  Courtney Carrillo MRN: [id number] DOB: 07-31-1966  Situation: Referral received for Complex Care Management related to Mental/Behavioral Health diagnosis grief Patient  I obtained verbal consent from Patient.  Visit completed with Patient  on the phone on 03/29/24  Background:   Past Medical History:  Diagnosis Date   Anemia    2015   Anxiety    COPD (chronic obstructive pulmonary disease) (HCC)    Depression    Headache    H/O   Hyperlipidemia    Hypertension    Sleep apnea    CPAP    Assessment: Patient Reported Symptoms:  Cognitive Cognitive Status: Alert and oriented to person, place, and time, Insightful and able to interpret abstract concepts Cognitive/Intellectual Conditions Management [RPT]: None reported or documented in medical history or problem list   Health Maintenance Behaviors: Annual physical exam, Social activities, Stress management  Neurological Neurological Review of Symptoms: No symptoms reported    HEENT HEENT Symptoms Reported: No symptoms reported      Cardiovascular Cardiovascular Symptoms Reported: No symptoms reported    Respiratory Respiratory Symptoms Reported: No symptoms reported    Endocrine Endocrine Symptoms Reported: No symptoms reported Is patient diabetic?: No Endocrine Comment: Per patient, Borderline Diabetic  Gastrointestinal Gastrointestinal Symptoms Reported: No symptoms reported      Genitourinary Genitourinary Symptoms Reported: No symptoms reported    Integumentary Integumentary Symptoms Reported: No symptoms reported    Musculoskeletal Musculoskelatal Symptoms Reviewed: No symptoms reported        Psychosocial Psychosocial Symptoms Reported: Depression - if selected complete PHQ 2-9 Behavioral Management Strategies: Medication therapy, Support group Behavioral Health Comment: Taking things day by day, nephew murdered in May Major Change/Loss/Stressor/Fears (CP):  Death of a loved one Behaviors When Feeling Stressed/Fearful: spends time with family, medication, sleeping Techniques to [employer] with Loss/Stress/Change: Diversional activities, Medication Quality of Family Relationships: supportive, helpful Do you feel physically threatened by others?: No    03/30/2024    PHQ2-9 Depression Screening   Little interest or pleasure in doing things Nearly every day  Feeling down, depressed, or hopeless Nearly every day  PHQ-2 - Total Score 6  Trouble falling or staying asleep, or sleeping too much Not at all  Feeling tired or having little energy Nearly every day  Poor appetite or overeating  More than half the days  Feeling bad about yourself - or that you are a failure or have let yourself or your family down Nearly every day  Trouble concentrating on things, such as reading the newspaper or watching television Nearly every day  Moving or speaking so slowly that other people could have noticed.  Or the opposite - being so fidgety or restless that you have been moving around a lot more than usual Not at all  Thoughts that you would be better off dead, or hurting yourself in some way Not at all  PHQ2-9 Total Score 17  If you checked off any problems, how difficult have these problems made it for you to do your work, take care of things at home, or get along with other people    Depression Interventions/Treatment Currently on Treatment      03/29/2024   11:15 AM 02/29/2024    9:07 AM 08/19/2023    9:01 AM 07/22/2023    9:51 AM  GAD 7 : Generalized Anxiety Score  Nervous, Anxious, on Edge 3 3 0 0  Control/stop worrying 3 3 0 3  Worry too much - different things 3 3 0 3  Trouble relaxing 2 0 0 3  Restless 2 3 0 0  Easily annoyed or irritable 3 3 0 0  Afraid - awful might happen 3 3 0 0  Total GAD 7 Score 19 18 0 9  Anxiety Difficulty Extremely difficult Extremely difficult Not difficult at all Not difficult at all     There were no vitals filed for this  visit.  Medications Reviewed Today     Reviewed by Ermalinda Lenn HERO, LCSW (Social Worker) on 03/29/24 at 1107  Med List Status: <None>   Medication Order Taking? Sig Documenting Provider Last Dose Status Informant  albuterol  (PROVENTIL ) (2.5 MG/3ML) 0.083% nebulizer solution [id number]  Take 3 mLs (2.5 mg total) by nebulization every 4 (four) hours as needed for wheezing or shortness of breath. Saunders Shona CROME, PA-C  Active Self, Child           Med Note (CARD, AMY L   Sun Jun 21, 2023  1:23 PM) Patient was given a script for a nebulizer.  amLODipine  (NORVASC ) 10 MG tablet [id number]  Take 1 tablet (10 mg total) by mouth daily. Lanetta Lingo, MD  Active Self, Child  aspirin  EC 81 MG tablet [id number]  Take 1 tablet (81 mg total) by mouth daily. Swallow whole. Antonette Angeline ORN, NP  Active   atorvastatin  (LIPITOR) 40 MG tablet [id number]  TAKE 1 TABLET(40 MG) BY MOUTH EVERY MORNING Baity, Angeline ORN, NP  Active Self, Child  benztropine  (COGENTIN ) 1 MG tablet [id number]  Take 1 mg by mouth every morning. [provider]  Active Self, Child  busPIRone  (BUSPAR ) 10 MG tablet [id number]  Take 20 mg by mouth 2 (two) times daily. [provider]  Active Self, Child  celecoxib  (CELEBREX ) 50 MG capsule [id number]  Take 1 capsule (50 mg total) by mouth 2 (two) times daily. Antonette Angeline ORN, NP  Active   COMBIVENT RESPIMAT 20-100 MCG/ACT AERS respimat [id number]  Inhale 2 puffs into the lungs every 6 (six) hours as needed for wheezing or shortness of breath. [provider]  Active   doxepin  (SINEQUAN ) 50 MG capsule [id number]  Take 50 mg by mouth at bedtime. [provider]  Active Self, Child  Dupilumab 300 MG/2ML EMMANUEL [id number]  Inject into the skin. [provider]  Active   VRAYLAR  6 MG CAPS [id number]  Take 1 capsule by mouth daily. [provider]  Active Self, Child  YUPELRI 175 MCG/3ML nebulizer solution [id number]  Inhale 175 mcg into the lungs. [provider]  Active             Recommendation:   PCP Follow-up Grief Counseling   Follow Up Plan:   Telephone follow up appointment date/time:  04/13/24   Lenn Ermalinda, LCSW [place]  [facility], [facility] Licensed Clinical Social Worker  Direct Dial: [phone]

## 2024-04-01 NOTE — Patient Outreach (Signed)
 Complex Care Management   Visit Note    Name:  Courtney Carrillo MRN: [id number] DOB: 1966-03-05  Situation: Referral received for Complex Care Management related to COPD, Hypertension and Depression. I obtained verbal consent from Patient.  Visit completed with Courtney Carrillo via telephone.  Background:   Past Medical History:  Diagnosis Date   Anemia    2015   Anxiety    COPD (chronic obstructive pulmonary disease) (HCC)    Depression    Headache    H/O   Hyperlipidemia    Hypertension    Sleep apnea    CPAP    Assessment: Patient Reported Symptoms: Cognitive Cognitive Status: Alert and oriented to person, place, and time, Normal speech and language skills Cognitive/Intellectual Conditions Management [RPT]: None reported or documented in medical history or problem list Health Maintenance Behaviors: Annual physical exam, Stress management, Social activities, Hobbies Healing Pattern: Average Health Facilitated by: Stress management, Rest  Neurological Neurological Review of Symptoms: No symptoms reported Neurological Management Strategies: Routine screening Neurological Self-Management Outcome: 4 (good)  HEENT HEENT Symptoms Reported: No symptoms reported HEENT Management Strategies: Routine screening HEENT Self-Management Outcome: 4 (good)  Cardiovascular Cardiovascular Symptoms Reported: No symptoms reported Does patient have uncontrolled Hypertension?: No  Respiratory Respiratory Symptoms Reported: No symptoms reported Other Respiratory Symptoms: Reports significant improvement in symptoms since taking steroids and adjusting inhalers and nebulizer as instructed Respiratory Self-Management Outcome: 4 (good)  Endocrine Endocrine Symptoms Reported: No symptoms reported Is patient diabetic?: No Endocrine Self-Management Outcome: 4 (good)  Gastrointestinal Gastrointestinal Symptoms Reported: No symptoms reported  Genitourinary Genitourinary Symptoms Reported: No symptoms  reported Genitourinary Self-Management Outcome: 4 (good)  Integumentary Integumentary Symptoms Reported: No symptoms reported Skin Management Strategies: Routine screening Skin Self-Management Outcome: 4 (good)  Musculoskeletal Musculoskelatal Symptoms Reviewed: No symptoms reported Musculoskeletal Management Strategies: Routine screening Musculoskeletal Self-Management Outcome: 4 (good)  Psychosocial Psychosocial Symptoms Reported: Depression - if selected complete PHQ 2-9 Behavioral Management Strategies: Medication therapy, Support group, Coping strategies Behavioral Health Self-Management Outcome: 4 (good) Major Change/Loss/Stressor/Fears (CP): Death of a loved one Behaviors When Feeling Stressed/Fearful: Resting, Working, Taking medication and spending time with family Techniques to [employer] with Loss/Stress/Change: Diversional activities, Medication (Spending time with family) Quality of Family Relationships: supportive, helpful Do you feel physically threatened by others?: No   There were no vitals filed for this visit. Outpatient Encounter Medications as of 03/29/2024  Medication Sig Note   albuterol  (PROVENTIL ) (2.5 MG/3ML) 0.083% nebulizer solution Take 3 mLs (2.5 mg total) by nebulization every 4 (four) hours as needed for wheezing or shortness of breath. 06/21/2023: Patient was given a script for a nebulizer.   amLODipine  (NORVASC ) 10 MG tablet Take 1 tablet (10 mg total) by mouth daily.    aspirin  EC 81 MG tablet Take 1 tablet (81 mg total) by mouth daily. Swallow whole.    atorvastatin  (LIPITOR) 40 MG tablet TAKE 1 TABLET(40 MG) BY MOUTH EVERY MORNING    benztropine  (COGENTIN ) 1 MG tablet Take 1 mg by mouth every morning.    busPIRone  (BUSPAR ) 10 MG tablet Take 20 mg by mouth 2 (two) times daily.    celecoxib  (CELEBREX ) 50 MG capsule Take 1 capsule (50 mg total) by mouth 2 (two) times daily.    COMBIVENT RESPIMAT 20-100 MCG/ACT AERS respimat Inhale 2 puffs into the lungs every 6  (six) hours as needed for wheezing or shortness of breath.    doxepin  (SINEQUAN ) 50 MG capsule Take 50 mg by mouth at bedtime.    Dupilumab  300 MG/2ML SOAJ Inject into the skin.    VRAYLAR  6 MG CAPS Take 1 capsule by mouth daily.    YUPELRI 175 MCG/3ML nebulizer solution Inhale 175 mcg into the lungs.    No facility-administered encounter medications on file as of 03/29/2024.      Recommendation:   Continue Current Plan of Care  Follow Up Plan:   Telephone follow up appointment with Nurse Case Manager on April 26, 2024   Jackson Acron [facility] RN Care Manager Direct Dial: [phone]  Fax: [phone] Website: [email or web address]

## 2024-04-01 NOTE — Patient Instructions (Signed)
 Thank you for allowing the Complex Care Management team to participate in your care. It was great speaking with  you!  We will follow up on April 26, 2024 at 1:30. Please do not hesitate to contact me if you require assistance prior to our next outreach.    Jackson Acron [facility] RN Care Manager Direct Dial: [phone]  Fax: [phone] Website: [email or web address]

## 2024-04-05 DIAGNOSIS — J441 Chronic obstructive pulmonary disease with (acute) exacerbation: Secondary | ICD-10-CM | POA: Diagnosis not present

## 2024-04-11 DIAGNOSIS — G4733 Obstructive sleep apnea (adult) (pediatric): Secondary | ICD-10-CM | POA: Diagnosis not present

## 2024-04-11 DIAGNOSIS — J40 Bronchitis, not specified as acute or chronic: Secondary | ICD-10-CM | POA: Diagnosis not present

## 2024-04-13 ENCOUNTER — Other Ambulatory Visit: Payer: Self-pay | Admitting: *Deleted

## 2024-04-14 DIAGNOSIS — J40 Bronchitis, not specified as acute or chronic: Secondary | ICD-10-CM | POA: Diagnosis not present

## 2024-04-14 NOTE — Patient Instructions (Signed)
 Visit Information  Thank you for taking time to visit with me today. Please don't hesitate to contact me if I can be of assistance to you before our next scheduled appointment.  Your next care management appointment is by telephone on 04/19/24 at 10am    Please call the care guide team at [phone] if you need to cancel, schedule, or reschedule an appointment.   Please call the Suicide and Crisis Lifeline: 988 call the [place]  National Suicide Prevention Lifeline: (332) 081-9645 or TTY: 978 323 4393 TTY [phone]) to talk to a trained counselor call 1-800-273-TALK (toll free, 24 hour hotline) if you are experiencing a Mental Health or Behavioral Health Crisis or need someone to talk to.  Courtney Schlichting, LCSW [place]  [facility], [facility] Licensed Clinical Social Worker  Direct Dial: [phone]

## 2024-04-14 NOTE — Patient Outreach (Signed)
 Complex Care Management   Visit Note  04/14/2024  Name:  Courtney Carrillo MRN: [id number] DOB: 1966/05/31  Situation: Referral received for Complex Care Management related to Mental/Behavioral Health diagnosis Grief I obtained verbal consent from Patient.  Visit completed with Patient  on the phone on 04/13/24  Background:   Past Medical History:  Diagnosis Date   Anemia    2015   Anxiety    COPD (chronic obstructive pulmonary disease) (HCC)    Depression    Headache    H/O   Hyperlipidemia    Hypertension    Sleep apnea    CPAP    Assessment: Patient Reported Symptoms:  Cognitive Cognitive Status: Alert and oriented to person, place, and time, Normal speech and language skills, Insightful and able to interpret abstract concepts Cognitive/Intellectual Conditions Management [RPT]: None reported or documented in medical history or problem list   Health Maintenance Behaviors: Annual physical exam, Social activities, Stress management Healing Pattern: Average Health Facilitated by: Stress management  Neurological Neurological Review of Symptoms: No symptoms reported    HEENT HEENT Symptoms Reported: No symptoms reported      Cardiovascular Cardiovascular Symptoms Reported: No symptoms reported    Respiratory Respiratory Symptoms Reported: No symptoms reported    Endocrine Endocrine Symptoms Reported: No symptoms reported    Gastrointestinal Gastrointestinal Symptoms Reported: No symptoms reported      Genitourinary Genitourinary Symptoms Reported: No symptoms reported    Integumentary Integumentary Symptoms Reported: No symptoms reported    Musculoskeletal Musculoskelatal Symptoms Reviewed: No symptoms reported        Psychosocial Psychosocial Symptoms Reported: Depression - if selected complete PHQ 2-9 Behavioral Management Strategies: Medication therapy, Support group, Coping strategies Major Change/Loss/Stressor/Fears (CP): Death of a loved one Behaviors When  Feeling Stressed/Fearful: resting, working, talking medication and spending time with family Techniques to [place] with Loss/Stress/Change: Diversional activities, Medication      04/14/2024    PHQ2-9 Depression Screening   Little interest or pleasure in doing things    Feeling down, depressed, or hopeless    PHQ-2 - Total Score    Trouble falling or staying asleep, or sleeping too much    Feeling tired or having little energy    Poor appetite or overeating     Feeling bad about yourself - or that you are a failure or have let yourself or your family down    Trouble concentrating on things, such as reading the newspaper or watching television    Moving or speaking so slowly that other people could have noticed.  Or the opposite - being so fidgety or restless that you have been moving around a lot more than usual    Thoughts that you would be better off dead, or hurting yourself in some way    PHQ2-9 Total Score    If you checked off any problems, how difficult have these problems made it for you to do your work, take care of things at home, or get along with other people    Depression Interventions/Treatment      There were no vitals filed for this visit.  Medications Reviewed Today     Reviewed by Courtney Carrillo CHRISTELLA, LCSW (Social Worker) on 04/13/24 at 1122  Med List Status: <None>   Medication Order Taking? Sig Documenting Provider Last Dose Status Informant  albuterol  (PROVENTIL ) (2.5 MG/3ML) 0.083% nebulizer solution [id number]  Take 3 mLs (2.5 mg total) by nebulization every 4 (four) hours as needed for wheezing or shortness of  breath. Courtney Carrillo  Active Self, Carrillo           Med Note (Courtney Carrillo   Sun Jun 21, 2023  1:23 PM) Patient was given a script for a nebulizer.  amLODipine  (NORVASC ) 10 MG tablet [id number]  Take 1 tablet (10 mg total) by mouth daily. Courtney Carrillo  Active Self, Carrillo  aspirin  EC 81 MG tablet [id number]  Take 1 tablet (81 mg total) by mouth  daily. Swallow whole. Courtney Carrillo  Active   atorvastatin  (LIPITOR) 40 MG tablet [id number]  TAKE 1 TABLET(40 MG) BY MOUTH EVERY MORNING Courtney Carrillo  Active Self, Carrillo  benztropine  (COGENTIN ) 1 MG tablet [id number]  Take 1 mg by mouth every morning. Provider, Historical, Carrillo  Active Self, Carrillo  busPIRone  (BUSPAR ) 10 MG tablet [id number]  Take 20 mg by mouth 2 (two) times daily. Provider, Historical, Carrillo  Active Self, Carrillo  celecoxib  (CELEBREX ) 50 MG capsule [id number]  Take 1 capsule (50 mg total) by mouth 2 (two) times daily. Courtney Carrillo  Active   COMBIVENT RESPIMAT 20-100 MCG/ACT AERS respimat [id number]  Inhale 2 puffs into the lungs every 6 (six) hours as needed for wheezing or shortness of breath. Provider, Historical, Carrillo  Active   doxepin  (SINEQUAN ) 50 MG capsule [id number]  Take 50 mg by mouth at bedtime. Provider, Historical, Carrillo  Active Self, Carrillo  Dupilumab 300 MG/2ML EMMANUEL [id number]  Inject into the skin. Provider, Historical, Carrillo  Active   VRAYLAR  6 MG CAPS [id number]  Take 1 capsule by mouth daily. Provider, Historical, Carrillo  Active Self, Carrillo  YUPELRI 175 MCG/3ML nebulizer solution [id number]  Inhale 175 mcg into the lungs. Provider, Historical, Carrillo  Active             Recommendation:   PCP Follow-up Continue to follow up on status of grief counseling  Follow Up Plan:   Telephone follow up appointment date/time:  04/19/24  Carrillo Mean, LCSW [place]  [facility], [facility] Licensed Clinical Social Worker  Direct Dial: [phone]

## 2024-04-19 ENCOUNTER — Telehealth: Admitting: *Deleted

## 2024-04-20 DIAGNOSIS — J4540 Moderate persistent asthma, uncomplicated: Secondary | ICD-10-CM | POA: Diagnosis not present

## 2024-04-26 ENCOUNTER — Other Ambulatory Visit: Payer: Self-pay

## 2024-04-26 DIAGNOSIS — J069 Acute upper respiratory infection, unspecified: Secondary | ICD-10-CM | POA: Diagnosis not present

## 2024-04-26 DIAGNOSIS — H6691 Otitis media, unspecified, right ear: Secondary | ICD-10-CM | POA: Diagnosis not present

## 2024-04-26 DIAGNOSIS — R058 Other specified cough: Secondary | ICD-10-CM | POA: Diagnosis not present

## 2024-04-26 DIAGNOSIS — J4489 Other specified chronic obstructive pulmonary disease: Secondary | ICD-10-CM | POA: Diagnosis not present

## 2024-04-28 NOTE — Patient Outreach (Signed)
 Complex Care Management   Visit Note    Name:  Courtney Carrillo MRN: [id number] DOB: 1965/10/28  Situation: Referral received for Complex Care Management related to COPD, Hypertension and Depression. I obtained verbal consent from Patient.  Visit completed with Courtney Carrillo via telephone.  Background:   Past Medical History:  Diagnosis Date   Anemia    2015   Anxiety    COPD (chronic obstructive pulmonary disease) (HCC)    Depression    Headache    H/O   Hyperlipidemia    Hypertension    Sleep apnea    CPAP    Assessment: Patient Reported Symptoms: Cognitive Cognitive Status: Alert and oriented to person, place, and time, Normal speech and language skills Cognitive/Intellectual Conditions Management [RPT]: None reported or documented in medical history or problem list Health Maintenance Behaviors: Annual physical exam, Stress management, Social activities Healing Pattern: Average Health Facilitated by: Rest  Neurological Neurological Review of Symptoms: No symptoms reported Neurological Management Strategies: Routine screening Neurological Self-Management Outcome: 4 (good)  HEENT HEENT Symptoms Reported: No symptoms reported HEENT Management Strategies: Routine screening HEENT Self-Management Outcome: 4 (good)  Cardiovascular Cardiovascular Symptoms Reported: No symptoms reported Does patient have uncontrolled Hypertension?: No Cardiovascular Self-Management Outcome: 4 (good)  Respiratory Respiratory Symptoms Reported: Other: Other Respiratory Symptoms: Evaluated on for acute visit by Pulmonology today for episodes of shortness of breath and congestions. Prescribed antibiotic and prednisone  for 5 days. Respiratory Self-Management Outcome: 4 (good)  Endocrine Endocrine Symptoms Reported: No symptoms reported Is patient diabetic?: No Endocrine Self-Management Outcome: 4 (good)  Gastrointestinal Gastrointestinal Symptoms Reported: No symptoms reported Gastrointestinal  Self-Management Outcome: 4 (good)  Genitourinary Genitourinary Symptoms Reported: No symptoms reported Genitourinary Self-Management Outcome: 4 (good)  Integumentary Integumentary Symptoms Reported: No symptoms reported Skin Management Strategies: Routine screening Skin Self-Management Outcome: 4 (good)  Musculoskeletal Musculoskelatal Symptoms Reviewed: No symptoms reported Musculoskeletal Self-Management Outcome: 4 (good)  Psychosocial Psychosocial Symptoms Reported: Sadness - if selected complete PHQ 2-9 Behavioral Management Strategies: Medication therapy, Support group, Coping strategies Behavioral Health Self-Management Outcome: 4 (good) Major Change/Loss/Stressor/Fears (CP): Death of a loved one Behaviors When Feeling Stressed/Fearful: Spending time with family and distractions Techniques to [employer] with Loss/Stress/Change: Diversional activities, Medication, Spiritual practice(s) Quality of Family Relationships: supportive, helpful Do you feel physically threatened by others?: No    04/28/2024    PHQ2-9 Depression Screening   Little interest or pleasure in doing things Not at all  Feeling down, depressed, or hopeless Several days  PHQ-2 - Total Score 1    Vitals:   04/26/24 1430  BP: 109/80    Medications Reviewed Today     Reviewed by Karoline Lima, RN (Registered Nurse) on 04/26/24 at 1413  Med List Status: <None>   Medication Order Taking? Sig Documenting Provider Last Dose Status Informant  albuterol  (PROVENTIL ) (2.5 MG/3ML) 0.083% nebulizer solution [id number]  Take 3 mLs (2.5 mg total) by nebulization every 4 (four) hours as needed for wheezing or shortness of breath. Saunders Shona CROME, PA-C  Active Self, Child           Med Note (CARD, AMY L   Sun Jun 21, 2023  1:23 PM) Patient was given a script for a nebulizer.  amLODipine  (NORVASC ) 10 MG tablet [id number]  Take 1 tablet (10 mg total) by mouth daily. Lanetta Lingo, MD  Active Self, Child  aspirin  EC 81 MG tablet  [id number]  Take 1 tablet (81 mg total) by mouth daily. Swallow whole. Antonette Angeline ORN, NP  Active  atorvastatin  (LIPITOR) 40 MG tablet [id number]  TAKE 1 TABLET(40 MG) BY MOUTH EVERY MORNING Baity, Angeline ORN, NP  Active Self, Child  benztropine  (COGENTIN ) 1 MG tablet [id number]  Take 1 mg by mouth every morning. [provider]  Active Self, Child  busPIRone  (BUSPAR ) 10 MG tablet [id number]  Take 20 mg by mouth 2 (two) times daily. [provider]  Active Self, Child  celecoxib  (CELEBREX ) 50 MG capsule [id number]  Take 1 capsule (50 mg total) by mouth 2 (two) times daily. Antonette Angeline ORN, NP  Active   COMBIVENT RESPIMAT 20-100 MCG/ACT AERS respimat [id number]  Inhale 2 puffs into the lungs every 6 (six) hours as needed for wheezing or shortness of breath. [provider]  Active   doxepin  (SINEQUAN ) 50 MG capsule [id number]  Take 50 mg by mouth at bedtime. [provider]  Active Self, Child  Dupilumab 300 MG/2ML EMMANUEL [id number]  Inject into the skin. [provider]  Active   VRAYLAR  6 MG CAPS [id number]  Take 1 capsule by mouth daily. [provider]  Active Self, Child  YUPELRI 175 MCG/3ML nebulizer solution [id number]  Inhale 175 mcg into the lungs. [provider]  Active             Recommendation:   Continue Current Plan of Care  Follow Up Plan:   Telephone follow up appointment with Nurse Case Manager on May 16, 2024   [place] Acron [facility] RN Care Manager Direct Dial: [phone]  Fax: [phone] Website: [email or web address]

## 2024-04-28 NOTE — Patient Instructions (Signed)
 Thank you for allowing the Complex Care Management team to participate in your care. It was great speaking you!  We will follow up on May 16, 2024 at 1130. Please do not hesitate to contact me if you require outreach prior to our next outreach.    Jackson Acron [facility] RN Care Manager Direct Dial: [phone]  Fax: [phone] Website: [email or web address]

## 2024-05-02 ENCOUNTER — Other Ambulatory Visit: Payer: Self-pay | Admitting: *Deleted

## 2024-05-03 NOTE — Patient Instructions (Signed)
 Visit Information  Thank you for taking time to visit with me today. Please don't hesitate to contact me if I can be of assistance to you before our next scheduled appointment.  Your next care management appointment is by telephone on 05/18/24 at 10:30am    Please call the care guide team at [phone] if you need to cancel, schedule, or reschedule an appointment.   Please call the Suicide and Crisis Lifeline: 988 call the [place]  National Suicide Prevention Lifeline: 7346892373 or TTY: (573)705-3969 TTY (623)531-1999) to talk to a trained counselor call 1-800-273-TALK (toll free, 24 hour hotline) call 911 if you are experiencing a Mental Health or Behavioral Health Crisis or need someone to talk to.  Richetta Cubillos, LCSW [place]  [facility], [facility] Licensed Clinical Social Worker  Direct Dial: [phone]

## 2024-05-03 NOTE — Patient Outreach (Addendum)
 Complex Care Management   Visit Note  05/03/2024  Name:  Courtney Carrillo MRN: [id number] DOB: 1965/11/12  Situation: Referral received for Complex Care Management related to Mental/Behavioral Health diagnosis grief I obtained verbal consent from Patient.  Visit completed with Patient  on the phone on 05/02/24  Background:   Past Medical History:  Diagnosis Date   Anemia    2015   Anxiety    COPD (chronic obstructive pulmonary disease) (HCC)    Depression    Headache    H/O   Hyperlipidemia    Hypertension    Sleep apnea    CPAP    Assessment: Patient Reported Symptoms:  Cognitive Cognitive Status: Alert and oriented to person, place, and time      Neurological Neurological Review of Symptoms: No symptoms reported    HEENT HEENT Symptoms Reported: No symptoms reported      Cardiovascular Cardiovascular Symptoms Reported: No symptoms reported    Respiratory Respiratory Symptoms Reported: No symptoms reported    Endocrine Is patient diabetic?: No    Gastrointestinal Gastrointestinal Symptoms Reported: No symptoms reported      Genitourinary Genitourinary Symptoms Reported: No symptoms reported    Integumentary Integumentary Symptoms Reported: No symptoms reported    Musculoskeletal Musculoskelatal Symptoms Reviewed: No symptoms reported        Psychosocial Psychosocial Symptoms Reported: Sadness - if selected complete PHQ 2-9 Additional Psychological Details: Patient states that she contacted [facility], however has not received a call back regading grief cousneling-Collaboraiton call made, left additional VM requesting a return call Behavioral Management Strategies: Medication therapy, Support group Behavioral Health Self-Management Outcome: 4 (good) Behaviors When Feeling Stressed/Fearful: spending time with family Techniques to [place] with Loss/Stress/Change: Diversional activities, Medication, Spiritual practice(s) Quality of Family Relationships: supportive,  helpful Do you feel physically threatened by others?: No    05/03/2024    PHQ2-9 Depression Screening   Little interest or pleasure in doing things    Feeling down, depressed, or hopeless    PHQ-2 - Total Score    Trouble falling or staying asleep, or sleeping too much    Feeling tired or having little energy    Poor appetite or overeating     Feeling bad about yourself - or that you are a failure or have let yourself or your family down    Trouble concentrating on things, such as reading the newspaper or watching television    Moving or speaking so slowly that other people could have noticed.  Or the opposite - being so fidgety or restless that you have been moving around a lot more than usual    Thoughts that you would be better off dead, or hurting yourself in some way    PHQ2-9 Total Score    If you checked off any problems, how difficult have these problems made it for you to do your work, take care of things at home, or get along with other people    Depression Interventions/Treatment      There were no vitals filed for this visit.  Medications Reviewed Today     Reviewed by Ermalinda Lenn CHRISTELLA, LCSW (Social Worker) on 05/03/24 at 1513  Med List Status: <None>   Medication Order Taking? Sig Documenting Provider Last Dose Status Informant  albuterol  (PROVENTIL ) (2.5 MG/3ML) 0.083% nebulizer solution [id number]  Take 3 mLs (2.5 mg total) by nebulization every 4 (four) hours as needed for wheezing or shortness of breath. Saunders Shona CROME, PA-C  Active Self, Child  Med Note (CARD, AMY L   Sun Jun 21, 2023  1:23 PM) Patient was given a script for a nebulizer.  amLODipine  (NORVASC ) 10 MG tablet [id number]  Take 1 tablet (10 mg total) by mouth daily. Lanetta Lingo, MD  Active Self, Child  aspirin  EC 81 MG tablet [id number]  Take 1 tablet (81 mg total) by mouth daily. Swallow whole. Antonette Angeline ORN, NP  Active   atorvastatin  (LIPITOR) 40 MG tablet [id number]  TAKE 1 TABLET(40 MG)  BY MOUTH EVERY MORNING Baity, Angeline ORN, NP  Active Self, Child  benztropine  (COGENTIN ) 1 MG tablet [id number]  Take 1 mg by mouth every morning. [provider]  Active Self, Child  busPIRone  (BUSPAR ) 10 MG tablet [id number]  Take 20 mg by mouth 2 (two) times daily. [provider]  Active Self, Child  celecoxib  (CELEBREX ) 50 MG capsule [id number]  Take 1 capsule (50 mg total) by mouth 2 (two) times daily. Antonette Angeline ORN, NP  Active   COMBIVENT RESPIMAT 20-100 MCG/ACT AERS respimat [id number]  Inhale 2 puffs into the lungs every 6 (six) hours as needed for wheezing or shortness of breath. [provider]  Active   doxepin  (SINEQUAN ) 50 MG capsule [id number]  Take 50 mg by mouth at bedtime. [provider]  Active Self, Child  Dupilumab 300 MG/2ML EMMANUEL [id number]  Inject into the skin. [provider]  Active   VRAYLAR  6 MG CAPS [id number]  Take 1 capsule by mouth daily. [provider]  Active Self, Child  YUPELRI 175 MCG/3ML nebulizer solution [id number]  Inhale 175 mcg into the lungs. [provider]  Active             Recommendation:   PCP Follow-up Continue Current Plan of Care  Follow Up Plan:   Telephone follow-up 05/18/24 10:30am  Karah Caruthers Ermalinda HUGHS [place]  [facility], [facility] Licensed Clinical Social Worker  Direct Dial: [phone]

## 2024-05-04 DIAGNOSIS — J449 Chronic obstructive pulmonary disease, unspecified: Secondary | ICD-10-CM | POA: Diagnosis not present

## 2024-05-05 DIAGNOSIS — G4733 Obstructive sleep apnea (adult) (pediatric): Secondary | ICD-10-CM | POA: Diagnosis not present

## 2024-05-05 DIAGNOSIS — Z79899 Other long term (current) drug therapy: Secondary | ICD-10-CM | POA: Diagnosis not present

## 2024-05-05 DIAGNOSIS — R7303 Prediabetes: Secondary | ICD-10-CM | POA: Diagnosis not present

## 2024-05-16 ENCOUNTER — Other Ambulatory Visit: Payer: Self-pay

## 2024-05-16 NOTE — Patient Outreach (Unsigned)
 Complex Care Management   Visit Note  05/16/2024  Name:  Courtney Carrillo MRN: [id number] DOB: 07-31-1966  Situation: Referral received for Complex Care Management related to {Criteria:32550} I obtained verbal consent from {CHL AMB Patient/Caregiver:28184}.  Visit completed with {CHL AMB Patient/Caregiver:28184}  {VISIT LOCATION:32553}  Background:   Past Medical History:  Diagnosis Date   Anemia    2015   Anxiety    COPD (chronic obstructive pulmonary disease) (HCC)    Depression    Headache    H/O   Hyperlipidemia    Hypertension    Sleep apnea    CPAP    Assessment: Patient Reported Symptoms:  Cognitive        Neurological      HEENT        Cardiovascular      Respiratory      Endocrine      Gastrointestinal        Genitourinary      Integumentary      Musculoskeletal          Psychosocial       Quality of Family Relationships: supportive, helpful Do you feel physically threatened by others?: No    05/16/2024    PHQ2-9 Depression Screening   Little interest or pleasure in doing things    Feeling down, depressed, or hopeless    PHQ-2 - Total Score    Trouble falling or staying asleep, or sleeping too much    Feeling tired or having little energy    Poor appetite or overeating     Feeling bad about yourself - or that you are a failure or have let yourself or your family down    Trouble concentrating on things, such as reading the newspaper or watching television    Moving or speaking so slowly that other people could have noticed.  Or the opposite - being so fidgety or restless that you have been moving around a lot more than usual    Thoughts that you would be better off dead, or hurting yourself in some way    PHQ2-9 Total Score    If you checked off any problems, how difficult have these problems made it for you to do your work, take care of things at home, or get along with other people    Depression Interventions/Treatment      There were  no vitals filed for this visit.  Medications Reviewed Today     Reviewed by Karoline Lima, RN (Registered Nurse) on 05/16/24 at 1137  Med List Status: <None>   Medication Order Taking? Sig Documenting Provider Last Dose Status Informant  albuterol  (PROVENTIL ) (2.5 MG/3ML) 0.083% nebulizer solution [id number]  Take 3 mLs (2.5 mg total) by nebulization every 4 (four) hours as needed for wheezing or shortness of breath. Saunders Shona CROME, PA-C  Active Self, Child           Med Note (CARD, AMY L   Sun Jun 21, 2023  1:23 PM) Patient was given a script for a nebulizer.  amLODipine  (NORVASC ) 10 MG tablet [id number]  Take 1 tablet (10 mg total) by mouth daily. Lanetta Lingo, MD  Active Self, Child  aspirin  EC 81 MG tablet [id number]  Take 1 tablet (81 mg total) by mouth daily. Swallow whole. Antonette Angeline ORN, NP  Active   atorvastatin  (LIPITOR) 40 MG tablet [id number]  TAKE 1 TABLET(40 MG) BY MOUTH EVERY MORNING Baity, Angeline ORN, NP  Active Self, Child  benztropine  (COGENTIN ) 1 MG  tablet [id number]  Take 1 mg by mouth every morning. [provider]  Active Self, Child  busPIRone  (BUSPAR ) 10 MG tablet [id number]  Take 20 mg by mouth 2 (two) times daily. [provider]  Active Self, Child  celecoxib  (CELEBREX ) 50 MG capsule [id number]  Take 1 capsule (50 mg total) by mouth 2 (two) times daily. Antonette Angeline ORN, NP  Active   COMBIVENT RESPIMAT 20-100 MCG/ACT AERS respimat [id number]  Inhale 2 puffs into the lungs every 6 (six) hours as needed for wheezing or shortness of breath. [provider]  Active   doxepin  (SINEQUAN ) 50 MG capsule [id number]  Take 50 mg by mouth at bedtime. [provider]  Active Self, Child  Dupilumab 300 MG/2ML EMMANUEL [id number]  Inject into the skin. [provider]  Active   VRAYLAR  6 MG CAPS [id number]  Take 1 capsule by mouth daily. [provider]  Active Self, Child  YUPELRI 175 MCG/3ML nebulizer solution [id number]  Inhale 175 mcg  into the lungs. [provider]  Active             Recommendation:   {RECOMMENDATONS:32554}  Follow Up Plan:   {FOLLOWUP:32559}  SIG ***

## 2024-05-17 NOTE — Patient Instructions (Signed)
 Thank you for allowing the Complex Care Management team to participate in your care. It was great speaking with you!  Reminders: -Please remember to attend your Pulmonology appointment on May 18, 2024.  We will follow up on June 21, 2024 at 1000. Please do not hesitate to contact me if you require assistance prior to our next outreach.   [place] Acron [facility] Population Health RN Care Manager Direct Dial: [phone]  Fax: [phone] Website: [email or web address]

## 2024-05-18 ENCOUNTER — Other Ambulatory Visit: Payer: Self-pay | Admitting: *Deleted

## 2024-05-18 DIAGNOSIS — J4540 Moderate persistent asthma, uncomplicated: Secondary | ICD-10-CM | POA: Diagnosis not present

## 2024-05-18 DIAGNOSIS — Z79899 Other long term (current) drug therapy: Secondary | ICD-10-CM | POA: Diagnosis not present

## 2024-05-18 NOTE — Patient Instructions (Signed)
 Visit Information  Thank you for taking time to visit with me today. Please don't hesitate to contact me if I can be of assistance to you before our next scheduled appointment.  Your next care management appointment is no further scheduled appointments.   Closing From: [facility] social work care management  Please call the care guide team at [phone] if you need to cancel, schedule, or reschedule an appointment.   Please call the Suicide and Crisis Lifeline: 988 call the [place]  National Suicide Prevention Lifeline: 586-858-4354 or TTY: (613) 426-8260 TTY [phone]) to talk to a trained counselor call 1-800-273-TALK (toll free, 24 hour hotline) call 911 if you are experiencing a Mental Health or Behavioral Health Crisis or need someone to talk to.  Sheanna Dail, LCSW   [facility], [facility] Licensed Clinical Social Worker  Direct Dial: [phone]

## 2024-05-18 NOTE — Patient Outreach (Addendum)
 Complex Care Management   Visit Note  05/18/2024  Name:  Courtney Carrillo MRN: [id number] DOB: 12/05/1965  Situation: Referral received for Complex Care Management related to Grief I obtained verbal consent from Patient. Patient confirms feeling much better. Patient states that she has lost weight, medications are beneficial.  Visit completed with Patient  on the phone  Background:   Past Medical History:  Diagnosis Date   Anemia    2015   Anxiety    COPD (chronic obstructive pulmonary disease) (HCC)    Depression    Headache    H/O   Hyperlipidemia    Hypertension    Sleep apnea    CPAP    Assessment: Patient Reported Symptoms:  Cognitive Cognitive Status: Alert and oriented to person, place, and time, Insightful and able to interpret abstract concepts, Normal speech and language skills Cognitive/Intellectual Conditions Management [RPT]: None reported or documented in medical history or problem list   Health Maintenance Behaviors: Annual physical exam, Stress management, Social activities Healing Pattern: Average Health Facilitated by: Rest  Neurological Neurological Review of Symptoms: No symptoms reported    HEENT HEENT Symptoms Reported: No symptoms reported      Cardiovascular Cardiovascular Symptoms Reported: No symptoms reported    Respiratory Other Respiratory Symptoms: patient reports having sleep apnea-prescribed zepbound-pulmonologist prescribed-next appt 09/04/24 Respiratory Management Strategies: CPAP, Medication therapy Respiratory Self-Management Outcome: 4 (good)  Endocrine Endocrine Symptoms Reported: No symptoms reported    Gastrointestinal Gastrointestinal Symptoms Reported: No symptoms reported      Genitourinary Genitourinary Symptoms Reported: No symptoms reported    Integumentary Integumentary Symptoms Reported: No symptoms reported    Musculoskeletal Musculoskelatal Symptoms Reviewed: No symptoms reported        Psychosocial Psychosocial  Symptoms Reported: Other Other Psychosocial Conditions: Patient states that depression has improved-looking foward to Grief Counseling 05/24/24 at 10am-patient seen by Psychiatrist today Behavioral Management Strategies: Support group, Medication therapy Behavioral Health Self-Management Outcome: 4 (good) Major Change/Loss/Stressor/Fears (CP): Death of a loved one Behaviors When Feeling Stressed/Fearful: spending time with family, spiritual practices Techniques to [employer] with Loss/Stress/Change: Diversional activities, Medication, Spiritual practice(s) Quality of Family Relationships: supportive, helpful Do you feel physically threatened by others?: No    05/18/2024    PHQ2-9 Depression Screening   Little interest or pleasure in doing things Not at all  Feeling down, depressed, or hopeless Not at all  PHQ-2 - Total Score 0  Trouble falling or staying asleep, or sleeping too much    Feeling tired or having little energy    Poor appetite or overeating     Feeling bad about yourself - or that you are a failure or have let yourself or your family down    Trouble concentrating on things, such as reading the newspaper or watching television    Moving or speaking so slowly that other people could have noticed.  Or the opposite - being so fidgety or restless that you have been moving around a lot more than usual    Thoughts that you would be better off dead, or hurting yourself in some way    PHQ2-9 Total Score    If you checked off any problems, how difficult have these problems made it for you to do your work, take care of things at home, or get along with other people    Depression Interventions/Treatment      There were no vitals filed for this visit.  Medications Reviewed Today     Reviewed by Ermalinda Penton  CHRISTELLA HUGHS (Child psychotherapist) on 05/18/24 at 1032  Med List Status: <None>   Medication Order Taking? Sig Documenting Provider Last Dose Status Informant  albuterol  (PROVENTIL ) (2.5  MG/3ML) 0.083% nebulizer solution [id number] Yes Take 3 mLs (2.5 mg total) by nebulization every 4 (four) hours as needed for wheezing or shortness of breath. Saunders Shona CROME, PA-C  Active Self, Child           Med Note (CARD, AMY L   Sun Jun 21, 2023  1:23 PM) Patient was given a script for a nebulizer.  amLODipine  (NORVASC ) 10 MG tablet [id number] Yes Take 1 tablet (10 mg total) by mouth daily. Lanetta Lingo, MD  Active Self, Child  aspirin  EC 81 MG tablet [id number] Yes Take 1 tablet (81 mg total) by mouth daily. Swallow whole. Antonette Angeline ORN, NP  Active   atorvastatin  (LIPITOR) 40 MG tablet [id number] Yes TAKE 1 TABLET(40 MG) BY MOUTH EVERY MORNING Baity, Angeline ORN, NP  Active Self, Child  benztropine  (COGENTIN ) 1 MG tablet [id number] Yes Take 1 mg by mouth every morning. [provider]  Active Self, Child  busPIRone  (BUSPAR ) 10 MG tablet [id number] Yes Take 20 mg by mouth 2 (two) times daily. [provider]  Active Self, Child  celecoxib  (CELEBREX ) 50 MG capsule [id number] Yes Take 1 capsule (50 mg total) by mouth 2 (two) times daily. Antonette Angeline ORN, NP  Active   COMBIVENT RESPIMAT 20-100 MCG/ACT AERS respimat [id number] Yes Inhale 2 puffs into the lungs every 6 (six) hours as needed for wheezing or shortness of breath. [provider]  Active   doxepin  (SINEQUAN ) 50 MG capsule [id number] Yes Take 50 mg by mouth at bedtime. [provider]  Active Self, Child  Dupilumab 300 MG/2ML EMMANUEL [id number] Yes Inject into the skin. [provider]  Active   VRAYLAR  6 MG CAPS [id number] Yes Take 1 capsule by mouth daily. [provider]  Active Self, Child  YUPELRI 175 MCG/3ML nebulizer solution [id number] Yes Inhale 175 mcg into the lungs. [provider]  Active             Recommendation:   PCP Follow-up Specialty provider follow-up as scheduled Pulmonologist 09/04/24 Grief Counseling through [facility] 05/24/24 RNCM 06/21/24 AWV  01/13/25  Follow Up Plan:   Closing From:  VBC[facility] social work case management  Ryliegh Mcduffey, LCSW [place]  [facility], [facility] Licensed Clinical Social Worker  Direct Dial: [phone]

## 2024-05-25 ENCOUNTER — Encounter: Payer: Self-pay | Admitting: Internal Medicine

## 2024-05-25 ENCOUNTER — Ambulatory Visit (INDEPENDENT_AMBULATORY_CARE_PROVIDER_SITE_OTHER): Admitting: Internal Medicine

## 2024-05-25 VITALS — BP 124/84 | Ht 65.0 in | Wt 244.0 lb

## 2024-05-25 DIAGNOSIS — Z981 Arthrodesis status: Secondary | ICD-10-CM

## 2024-05-25 DIAGNOSIS — M5441 Lumbago with sciatica, right side: Secondary | ICD-10-CM | POA: Diagnosis not present

## 2024-05-25 DIAGNOSIS — G8929 Other chronic pain: Secondary | ICD-10-CM | POA: Diagnosis not present

## 2024-05-25 DIAGNOSIS — M545 Low back pain, unspecified: Secondary | ICD-10-CM

## 2024-05-25 MED ORDER — PREDNISONE 10 MG PO TABS: ORAL_TABLET | ORAL | 0 refills | Status: DC

## 2024-05-25 MED ORDER — METHOCARBAMOL 500 MG PO TABS: 500.0000 mg | ORAL_TABLET | Freq: Three times a day (TID) | ORAL | 0 refills | Status: AC | PRN

## 2024-05-25 MED ORDER — KETOROLAC TROMETHAMINE 30 MG/ML IJ SOLN
30.0000 mg | Freq: Once | INTRAMUSCULAR | Status: AC
  Administered 2024-05-25: 30 mg via INTRAMUSCULAR

## 2024-05-25 NOTE — Patient Instructions (Signed)

## 2024-05-25 NOTE — Progress Notes (Signed)
 Subjective:    Patient ID: Courtney Carrillo, female    DOB: 01-09-1966, 58 y.o.   MRN: [id number]  HPI   Discussed the use of AI scribe software for clinical note transcription with the patient, who gave verbal consent to proceed.  Courtney Carrillo is a 58 year old female with a history of lumbar fusion who presents with worsening back pain.  She has been experiencing worsening back pain primarily on the right side for about a month, which began after she turned the wrong way at work. The pain is described as sore and achy, radiating down her right leg.  She reports associated numbness in the right lower extremity but denies tingling, burning, or loss of bowel or bladder control.  She has a history of lumbar fusion in 2002, which initially improved her pain. However, the recent incident at work has exacerbated her symptoms. She has been using tylenol  for pain relief, but it is no longer effective. She continues to take celebrex  50 mg twice a day.    She has been using a cold press and heating pad without relief. She has not taken muscle relaxers before. She missed two days of work due to the pain but attempted to return, only to miss another day when the pain persisted.  She reports difficulty sleeping at night due to the pain and is unable to turn over in bed.   There is no imaging on file for review.  Review of Systems  Past Medical History:  Diagnosis Date   Anemia    2015   Anxiety    COPD (chronic obstructive pulmonary disease) (HCC)    Depression    Headache    H/O   Hyperlipidemia    Hypertension    Sleep apnea    CPAP    Current Outpatient Medications  Medication Sig Dispense Refill   albuterol  (PROVENTIL ) (2.5 MG/3ML) 0.083% nebulizer solution Take 3 mLs (2.5 mg total) by nebulization every 4 (four) hours as needed for wheezing or shortness of breath. 75 mL 2   amLODipine  (NORVASC ) 10 MG tablet Take 1 tablet (10 mg total) by mouth daily. 30 tablet 11   aspirin  EC 81  MG tablet Take 1 tablet (81 mg total) by mouth daily. Swallow whole. 90 tablet 1   atorvastatin  (LIPITOR) 40 MG tablet TAKE 1 TABLET(40 MG) BY MOUTH EVERY MORNING 90 tablet 2   benztropine  (COGENTIN ) 1 MG tablet Take 1 mg by mouth every morning.     busPIRone  (BUSPAR ) 10 MG tablet Take 20 mg by mouth 2 (two) times daily.     celecoxib  (CELEBREX ) 50 MG capsule Take 1 capsule (50 mg total) by mouth 2 (two) times daily. 180 capsule 1   COMBIVENT RESPIMAT 20-100 MCG/ACT AERS respimat Inhale 2 puffs into the lungs every 6 (six) hours as needed for wheezing or shortness of breath.     doxepin  (SINEQUAN ) 50 MG capsule Take 50 mg by mouth at bedtime.     Dupilumab 300 MG/2ML SOAJ Inject into the skin.     VRAYLAR  6 MG CAPS Take 1 capsule by mouth daily.     YUPELRI 175 MCG/3ML nebulizer solution Inhale 175 mcg into the lungs.     No current facility-administered medications for this visit.    Allergies  Allergen Reactions   Lisinopril Cough   Losartan Cough   Other Other (See Comments)    Perfumes : Congestion    Family History  Problem Relation Age of Onset  Diabetes Mother    Breast cancer Sister     Social History   Socioeconomic History   Marital status: Single    Spouse name: Not on file   Number of children: Not on file   Years of education: Not on file   Highest education level: Not on file  Occupational History   Not on file  Tobacco Use   Smoking status: Former    Current packs/day: 0.50    Average packs/day: 0.5 packs/day for 36.1 years (18.0 ttl pk-yrs)    Types: Cigarettes    Start date: 09/12/2021   Smokeless tobacco: Never  Vaping Use   Vaping status: Never Used  Substance and Sexual Activity   Alcohol use: No   Drug use: Yes    Frequency: 7.0 times per week    Types: Marijuana   Sexual activity: Not on file  Other Topics Concern   Not on file  Social History Narrative   Not on file   Social Drivers of Health   Financial Resource Strain: Low Risk   (02/29/2024)   Overall Financial Resource Strain (CARDIA)    Difficulty of Paying Living Expenses: Not very hard  Food Insecurity: No Food Insecurity (02/29/2024)   Hunger Vital Sign    Worried About Running Out of Food in the Last Year: Never true    Ran Out of Food in the Last Year: Never true  Transportation Needs: No Transportation Needs (02/29/2024)   PRAPARE - Administrator, Civil Service (Medical): No    Lack of Transportation (Non-Medical): No  Physical Activity: Inactive (01/01/2024)   Exercise Vital Sign    Days of Exercise per Week: 0 days    Minutes of Exercise per Session: 0 min  Stress: Stress Concern Present (02/29/2024)   [employer] of Occupational Health - Occupational Stress Questionnaire    Feeling of Stress: To some extent  Social Connections: Unknown (02/29/2024)   Social Connection and Isolation Panel    Frequency of Communication with Friends and Family: More than three times a week    Frequency of Social Gatherings with Friends and Family: More than three times a week    Attends Religious Services: More than 4 times per year    Active Member of [employer] or Organizations: No    Attends Banker Meetings: Never    Marital Status: Not on file  Recent Concern: Social Connections - Moderately Isolated (01/01/2024)   Social Connection and Isolation Panel    Frequency of Communication with Friends and Family: More than three times a week    Frequency of Social Gatherings with Friends and Family: Once a week    Attends Religious Services: More than 4 times per year    Active Member of [employer] or Organizations: No    Attends Banker Meetings: Never    Marital Status: Never married  Intimate Partner Violence: Not At Risk (02/29/2024)   Humiliation, Afraid, Rape, and Kick questionnaire    Fear of Current or Ex-Partner: No    Emotionally Abused: No    Physically Abused: No    Sexually Abused: No     Constitutional: Denies fever,  malaise, fatigue, headache or abrupt weight changes.  Respiratory: Patient reports chronic shortness of breath.  Denies difficulty breathing, cough or sputum production.   Cardiovascular: Patient reports intermittent swelling in ankles.  Denies chest pain, chest tightness, palpitations or swelling in the hands.  Gastrointestinal: Denies loss of bowel control, abdominal pain, bloating, constipation,  diarrhea or blood in the stool.  GU: Denies loss of bladder control, urgency, frequency, pain with urination, burning sensation, blood in urine, odor or discharge. Musculoskeletal: Patient reports chronic back pain.  Denies decrease in range of motion, difficulty with gait, or joint swelling.  Neurological: Patient reports numbness of right leg.  Denies dizziness, difficulty with memory, difficulty with speech or problems with balance and coordination.   No other specific complaints in a complete review of systems (except as listed in HPI above).     Objective:   Physical Exam BP 124/84 (BP Location: Left Arm, Patient Position: Sitting, Cuff Size: Large)   Ht 5' 5 (1.651 m)   Wt 244 lb (110.7 kg)   BMI 40.60 kg/m     Wt Readings from Last 3 Encounters:  02/29/24 242 lb 9.6 oz (110 kg)  08/19/23 238 lb (108 kg)  07/22/23 229 lb (103.9 kg)    General: Appears her stated age, obese in NAD. Skin: Warm, dry and intact.  Cardiovascular: Normal rate and rhythm.  Pulmonary/Chest: Normal effort and positive vesicular breath sounds. No respiratory distress. No wheezes, rales or ronchi noted.  Musculoskeletal: Decreased flexion and lateral bending of the spine.  Normal extension and rotation of the spine.  Pain with palpation over the lumbar spine and the right paraspinal muscles.  Able to stand on tiptoes.  Straight 5/5 LLE, 4/5 RLE.  She has difficulty getting from a sitting to a standing position.  Hunched gait without device. Neurological: Alert and oriented.  Positive SLR on the right at 45  degrees.  Coordination normal.    BMET    Component Value Date/Time   NA 143 02/29/2024 0918   K 3.7 02/29/2024 0918   CL 104 02/29/2024 0918   CO2 31 02/29/2024 0918   GLUCOSE 138 (H) 02/29/2024 0918   BUN 7 02/29/2024 0918   CREATININE 0.82 02/29/2024 0918   CALCIUM  9.8 02/29/2024 0918   GFRNONAA >60 07/10/2023 1443   GFRNONAA 74 08/17/2020 0814   GFRAA 86 08/17/2020 0814    Lipid Panel     Component Value Date/Time   CHOL 151 02/29/2024 0918   TRIG 103 02/29/2024 0918   HDL 49 (L) 02/29/2024 0918   CHOLHDL 3.1 02/29/2024 0918   VLDL 29 06/22/2023 0846   LDLCALC 82 02/29/2024 0918    CBC    Component Value Date/Time   WBC 8.9 02/29/2024 0918   RBC 5.35 (H) 02/29/2024 0918   HGB 11.9 02/29/2024 0918   HCT 40.0 02/29/2024 0918   PLT 359 02/29/2024 0918   MCV 74.8 (L) 02/29/2024 0918   MCH 22.2 (L) 02/29/2024 0918   MCHC 29.8 (L) 02/29/2024 0918   RDW 16.0 (H) 02/29/2024 0918   LYMPHSABS 2.2 06/19/2023 1039   MONOABS 0.6 06/19/2023 1039   EOSABS 0.6 (H) 06/19/2023 1039   BASOSABS 0.1 06/19/2023 1039    Hgb A1C Lab Results  Component Value Date   HGBA1C 6.3 (H) 02/29/2024           Assessment & Plan:   Assessment and Plan    Low back pain with right-sided sciatica Chronic low back pain with acute exacerbation from work-related injury. Pain radiates down the right leg. Current medications ineffective. - Prescribed prednisone  10 mg  taper for 9 days. - Prescribed methocarbamol  500 mg every 8 hours as needed, advised caution due to drowsiness. - Administered 30 mg Toradol injection IM x 1. - Encouraged stretching and core strengthening - Consider PT versus  MRI if symptoms persist.  Status post lumbar fusion Lumbar fusion in 2002 with residual limitations in range of motion due to surgical rods.      RTC in 3 months for your annual exam Angeline Laura, NP

## 2024-06-01 DIAGNOSIS — G4733 Obstructive sleep apnea (adult) (pediatric): Secondary | ICD-10-CM | POA: Diagnosis not present

## 2024-06-01 DIAGNOSIS — J4540 Moderate persistent asthma, uncomplicated: Secondary | ICD-10-CM | POA: Diagnosis not present

## 2024-06-01 DIAGNOSIS — J4489 Other specified chronic obstructive pulmonary disease: Secondary | ICD-10-CM | POA: Diagnosis not present

## 2024-06-01 DIAGNOSIS — E876 Hypokalemia: Secondary | ICD-10-CM | POA: Diagnosis not present

## 2024-06-01 DIAGNOSIS — Z79899 Other long term (current) drug therapy: Secondary | ICD-10-CM | POA: Diagnosis not present

## 2024-06-06 DIAGNOSIS — E876 Hypokalemia: Secondary | ICD-10-CM | POA: Diagnosis not present

## 2024-06-10 DIAGNOSIS — J40 Bronchitis, not specified as acute or chronic: Secondary | ICD-10-CM | POA: Diagnosis not present

## 2024-06-10 DIAGNOSIS — G4733 Obstructive sleep apnea (adult) (pediatric): Secondary | ICD-10-CM | POA: Diagnosis not present

## 2024-06-11 DIAGNOSIS — G4733 Obstructive sleep apnea (adult) (pediatric): Secondary | ICD-10-CM | POA: Diagnosis not present

## 2024-06-11 DIAGNOSIS — J40 Bronchitis, not specified as acute or chronic: Secondary | ICD-10-CM | POA: Diagnosis not present

## 2024-06-21 ENCOUNTER — Other Ambulatory Visit: Payer: Self-pay

## 2024-06-21 ENCOUNTER — Telehealth: Payer: Self-pay

## 2024-06-21 NOTE — Telephone Encounter (Signed)
 Copied from CRM [id number]. Topic: Appointments - Scheduling Inquiry for Clinic >> Jun 21, 2024 11:34 AM Donna BRAVO wrote: Reason for CRM: Courtney Carrillo is with the patient who is asking for TDAP injection please call patient to schedule  Patient phone [phone]

## 2024-06-21 NOTE — Patient Outreach (Unsigned)
 Complex Care Management   Visit Note  06/21/2024  Name:  Courtney Carrillo MRN: [id number] DOB: 1966/04/16  Situation: Referral received for Complex Care Management related to COPD and Hypertension. I obtained verbal consent from Patient.  Visit completed with [name] via telephone.  Background:   Past Medical History:  Diagnosis Date   Anemia    2015   Anxiety    COPD (chronic obstructive pulmonary disease) (HCC)    Depression    Headache    H/O   Hyperlipidemia    Hypertension    Sleep apnea    CPAP    Assessment: Patient Reported Symptoms:  Cognitive        Neurological      HEENT        Cardiovascular      Respiratory      Endocrine      Gastrointestinal        Genitourinary      Integumentary      Musculoskeletal          Psychosocial            06/21/2024    PHQ2-9 Depression Screening   Little interest or pleasure in doing things    Feeling down, depressed, or hopeless    PHQ-2 - Total Score    Trouble falling or staying asleep, or sleeping too much    Feeling tired or having little energy    Poor appetite or overeating     Feeling bad about yourself - or that you are a failure or have let yourself or your family down    Trouble concentrating on things, such as reading the newspaper or watching television    Moving or speaking so slowly that other people could have noticed.  Or the opposite - being so fidgety or restless that you have been moving around a lot more than usual    Thoughts that you would be better off dead, or hurting yourself in some way    PHQ2-9 Total Score    If you checked off any problems, how difficult have these problems made it for you to do your work, take care of things at home, or get along with other people    Depression Interventions/Treatment      There were no vitals filed for this visit.    Medications Reviewed Today     Reviewed by Karoline Lima, RN (Registered Nurse) on 06/21/24 at 1123  Med List  Status: <None>   Medication Order Taking? Sig Documenting Provider Last Dose Status Informant  albuterol  (PROVENTIL ) (2.5 MG/3ML) 0.083% nebulizer solution [id number]  Take 3 mLs (2.5 mg total) by nebulization every 4 (four) hours as needed for wheezing or shortness of breath. Saunders Shona CROME, PA-C  Expired 06/18/24 2359 Self, Child           Med Note (CARD, AMY L   Sun Jun 21, 2023  1:23 PM) Patient was given a script for a nebulizer.  amLODipine  (NORVASC ) 10 MG tablet [id number]  Take 1 tablet (10 mg total) by mouth daily. Lanetta Lingo, MD  Expired 06/04/24 2359 Self, Child  aspirin  EC 81 MG tablet [id number]  Take 1 tablet (81 mg total) by mouth daily. Swallow whole. Antonette Angeline ORN, NP  Active   atorvastatin  (LIPITOR) 40 MG tablet [id number]  TAKE 1 TABLET(40 MG) BY MOUTH EVERY MORNING Baity, Angeline ORN, NP  Active Self, Child  benztropine  (COGENTIN ) 1 MG tablet [id number]  Take 1 mg by mouth  every morning. [provider]  Active Self, Child  busPIRone  (BUSPAR ) 30 MG tablet [id number]  Take 30 mg by mouth 2 (two) times daily. [provider]  Active   celecoxib  (CELEBREX ) 50 MG capsule [id number]  Take 1 capsule (50 mg total) by mouth 2 (two) times daily. Antonette Angeline ORN, NP  Active   COMBIVENT RESPIMAT 20-100 MCG/ACT AERS respimat [id number]  Inhale 2 puffs into the lungs every 6 (six) hours as needed for wheezing or shortness of breath. [provider]  Active   doxepin  (SINEQUAN ) 50 MG capsule [id number]  Take 50 mg by mouth at bedtime. [provider]  Active Self, Child  Dupilumab 300 MG/2ML EMMANUEL [id number]  Inject into the skin. [provider]  Active   fluticasone-salmeterol (ADVAIR) 250-50 MCG/ACT AEPB [id number]  SMARTSIG:1 Puff(s) Via Inhaler Every 12 Hours [provider]  Active   loratadine (CLARITIN) 10 MG tablet [id number]  Take 10 mg by mouth daily. [provider]  Active   methocarbamol  (ROBAXIN ) 500 MG tablet [id number]   Take 1 tablet (500 mg total) by mouth every 8 (eight) hours as needed. Antonette Angeline ORN, NP  Active   predniSONE  (DELTASONE ) 10 MG tablet [id number]  Take 3 tabs on days 1-3, 2 tabs on days 4-6, 1 tab on days 7-9  Patient not taking: Reported on 06/21/2024   Antonette Angeline ORN, NP  Active   VRAYLAR  6 MG CAPS [id number]  Take 1 capsule by mouth daily. [provider]  Active Self, Child  YUPELRI 175 MCG/3ML nebulizer solution [id number]  Inhale 175 mcg into the lungs. [provider]  Active   ZEPBOUND 2.5 MG/0.5ML Pen [id number]  Inject 2.5 mg into the skin once a week. [provider]  Active             Recommendation:   {RECOMMENDATONS:32554}  Follow Up Plan:   {FOLLOWUP:32559}  SIG ***

## 2024-06-21 NOTE — Telephone Encounter (Signed)
 Spoke to patient, appointment scheduled.

## 2024-06-22 NOTE — Patient Instructions (Signed)
 Thank you for allowing the Complex Care Management team to participate in your care. It was great speaking with you!  Congratulations on meeting your Care Management goals. I'm so glad to hear that you're feeling well. Keep up the great work with managing your care.  Please do not hesitate to contact your PCP if your health needs change and you require additional care management outreach. The Complex Care Management team will gladly assist.   [facility] RN Care Manager Direct Dial: [phone]  Fax: [phone] Website: [email or web address]

## 2024-06-28 ENCOUNTER — Ambulatory Visit (INDEPENDENT_AMBULATORY_CARE_PROVIDER_SITE_OTHER)

## 2024-06-28 DIAGNOSIS — Z23 Encounter for immunization: Secondary | ICD-10-CM

## 2024-07-06 ENCOUNTER — Other Ambulatory Visit: Payer: Self-pay | Admitting: Internal Medicine

## 2024-07-06 DIAGNOSIS — E785 Hyperlipidemia, unspecified: Secondary | ICD-10-CM

## 2024-07-06 NOTE — Telephone Encounter (Unsigned)
 Copied from CRM #[id number]. Topic: Clinical - Medication Refill >> Jul 06, 2024  8:49 AM Wess RAMAN wrote: Medication: atorvastatin  (LIPITOR) 40 MG tablet   Has the patient contacted their pharmacy? Yes (Agent: If no, request that the patient contact the pharmacy for the refill. If patient does not wish to contact the pharmacy document the reason why and proceed with request.) (Agent: If yes, when and what did the pharmacy advise?)  This is the patient's preferred pharmacy:  [id number] [place], [place] - [address] AT [facility] [address] [place] [address] [place] [address] Phone: [phone] Fax: [phone]  Is this the correct pharmacy for this prescription? Yes If no, delete pharmacy and type the correct one.   Has the prescription been filled recently? Yes  Is the patient out of the medication? No  Has the patient been seen for an appointment in the last year OR does the patient have an upcoming appointment? Yes  Can we respond through MyChart? Yes  Agent: Please be advised that Rx refills may take up to 3 business days. We ask that you follow-up with your pharmacy.

## 2024-07-06 NOTE — Telephone Encounter (Signed)
 Copied from CRM [id number]. Topic: Clinical - Medication Refill >> Jul 06, 2024  8:51 AM Wess RAMAN wrote: Medication: atorvastatin  (LIPITOR) 40 MG tablet   Has the patient contacted their pharmacy? Yes (Agent: If no, request that the patient contact the pharmacy for the refill. If patient does not wish to contact the pharmacy document the reason why and proceed with request.) (Agent: If yes, when and what did the pharmacy advise?)  This is the patient's preferred pharmacy:  [id number] [place], [place] - [address] AT [facility] OF [address] [place] [address] [place] [address] Phone: [phone] Fax: [phone]  Is this the correct pharmacy for this prescription? Yes If no, delete pharmacy and type the correct one.   Has the prescription been filled recently? Yes  Is the patient out of the medication? No  Has the patient been seen for an appointment in the last year OR does the patient have an upcoming appointment? Yes  Can we respond through MyChart? Yes  Agent: Please be advised that Rx refills may take up to 3 business days. We ask that you follow-up with your pharmacy.

## 2024-07-11 MED ORDER — ATORVASTATIN CALCIUM 40 MG PO TABS: 40.0000 mg | ORAL_TABLET | Freq: Every day | ORAL | 0 refills | Status: DC

## 2024-07-11 NOTE — Telephone Encounter (Signed)
 Requested Prescriptions  Pending Prescriptions Disp Refills   atorvastatin  (LIPITOR) 40 MG tablet 90 tablet 0    Sig: Take 1 tablet (40 mg total) by mouth daily.     Cardiovascular:  Antilipid - Statins Failed - 07/11/2024  9:58 AM      Failed - Lipid Panel in normal range within the last 12 months    Cholesterol  Date Value Ref Range Status  02/29/2024 151 <200 mg/dL Final   LDL Cholesterol (Calc)  Date Value Ref Range Status  02/29/2024 82 mg/dL (calc) Final    Comment:    Reference range: <100 . Desirable range <100 mg/dL for primary prevention;   <70 mg/dL for patients with CHD or diabetic patients  with > or = 2 CHD risk factors. SABRA LDL-C is now calculated using the Martin-Hopkins  calculation, which is a validated novel method providing  better accuracy than the Friedewald equation in the  estimation of LDL-C.  Gladis APPLETHWAITE et al. SANDREA. 7986;689(80): 2061-2068  (http://education.[facility].com/faq/FAQ164)    HDL  Date Value Ref Range Status  02/29/2024 49 (L) > OR = 50 mg/dL Final   Triglycerides  Date Value Ref Range Status  02/29/2024 103 <150 mg/dL Final         Passed - Patient is not pregnant      Passed - Valid encounter within last 12 months    Recent Outpatient Visits           1 month ago Acute right-sided low back pain with right-sided sciatica   [facility] [place], Angeline ORN, NP   4 months ago Dyslipidemia   [facility] [place], Angeline ORN, [place]

## 2024-07-28 ENCOUNTER — Other Ambulatory Visit: Payer: Self-pay | Admitting: Internal Medicine

## 2024-07-28 DIAGNOSIS — E785 Hyperlipidemia, unspecified: Secondary | ICD-10-CM

## 2024-07-30 NOTE — Telephone Encounter (Signed)
 Too soon for refill.  Requested Prescriptions  Pending Prescriptions Disp Refills   atorvastatin  (LIPITOR) 40 MG tablet [Pharmacy Med Name: ATORVASTATIN  40MG  TABLETS] 90 tablet 0    Sig: TAKE 1 TABLET(40 MG) BY MOUTH EVERY MORNING     Cardiovascular:  Antilipid - Statins Failed - 07/30/2024  9:32 AM      Failed - Lipid Panel in normal range within the last 12 months    Cholesterol  Date Value Ref Range Status  02/29/2024 151 <200 mg/dL Final   LDL Cholesterol (Calc)  Date Value Ref Range Status  02/29/2024 82 mg/dL (calc) Final    Comment:    Reference range: <100 . Desirable range <100 mg/dL for primary prevention;   <70 mg/dL for patients with CHD or diabetic patients  with > or = 2 CHD risk factors. SABRA LDL-C is now calculated using the Martin-Hopkins  calculation, which is a validated novel method providing  better accuracy than the Friedewald equation in the  estimation of LDL-C.  Gladis APPLETHWAITE et al. SANDREA. 7986;689(80): 2061-2068  (http://education.[facility].com/faq/FAQ164)    HDL  Date Value Ref Range Status  02/29/2024 49 (L) > OR = 50 mg/dL Final   Triglycerides  Date Value Ref Range Status  02/29/2024 103 <150 mg/dL Final         Passed - Patient is not pregnant      Passed - Valid encounter within last 12 months    Recent Outpatient Visits           2 months ago Acute right-sided low back pain with right-sided sciatica   [facility] [place], Angeline ORN, NP   5 months ago Dyslipidemia   [facility] [place], Angeline ORN, [place]

## 2024-08-03 ENCOUNTER — Telehealth: Payer: Self-pay | Admitting: Internal Medicine

## 2024-08-03 NOTE — Telephone Encounter (Unsigned)
 Copied from CRM [id number]. Topic: Clinical - Medication Refill >> Aug 03, 2024 11:23 AM Harlene ORN wrote: Medication: amLODipine  (NORVASC ) 10 MG tablet, atorvastatin  (LIPITOR) 40 MG tablet  Has the patient contacted their pharmacy? No (Agent: If no, request that the patient contact the pharmacy for the refill. If patient does not wish to contact the pharmacy document the reason why and proceed with request.) (Agent: If yes, when and what did the pharmacy advise?)  This is the patient's preferred pharmacy:  [id number] [place], [place] - [address] AT [facility] OF [address] [place] [address] [place] [address] Phone: [phone] Fax: [phone]  Is this the correct pharmacy for this prescription? Yes If no, delete pharmacy and type the correct one.   Has the prescription been filled recently? No  Is the patient out of the medication? No  Has the patient been seen for an appointment in the last year OR does the patient have an upcoming appointment? Yes  Can we respond through MyChart? Yes  Agent: Please be advised that Rx refills may take up to 3 business days. We ask that you follow-up with your pharmacy.

## 2024-08-05 MED ORDER — AMLODIPINE BESYLATE 10 MG PO TABS: 10.0000 mg | ORAL_TABLET | Freq: Every day | ORAL | 0 refills | Status: AC

## 2024-08-05 NOTE — Addendum Note (Signed)
 Addended by: ANTONETTE ANGELINE ORN on: 08/05/2024 09:00 AM   Modules accepted: Orders

## 2024-08-05 NOTE — Telephone Encounter (Signed)
Patient notified prescriptions sent to pharmacy.

## 2024-08-05 NOTE — Telephone Encounter (Signed)
 Atorvastatin  refilled 07/11/2024 Amlodipine  sent to pharmacy today

## 2024-08-23 ENCOUNTER — Encounter: Payer: Self-pay | Admitting: Internal Medicine

## 2024-08-23 ENCOUNTER — Ambulatory Visit: Admitting: Internal Medicine

## 2024-08-23 VITALS — BP 136/84 | Ht 65.0 in | Wt 228.2 lb

## 2024-08-23 DIAGNOSIS — F331 Major depressive disorder, recurrent, moderate: Secondary | ICD-10-CM | POA: Insufficient documentation

## 2024-08-23 DIAGNOSIS — R7303 Prediabetes: Secondary | ICD-10-CM | POA: Diagnosis not present

## 2024-08-23 DIAGNOSIS — Z1211 Encounter for screening for malignant neoplasm of colon: Secondary | ICD-10-CM | POA: Diagnosis not present

## 2024-08-23 DIAGNOSIS — E785 Hyperlipidemia, unspecified: Secondary | ICD-10-CM | POA: Diagnosis not present

## 2024-08-23 DIAGNOSIS — Z780 Asymptomatic menopausal state: Secondary | ICD-10-CM

## 2024-08-23 DIAGNOSIS — Z0001 Encounter for general adult medical examination with abnormal findings: Secondary | ICD-10-CM | POA: Diagnosis not present

## 2024-08-23 DIAGNOSIS — Z1231 Encounter for screening mammogram for malignant neoplasm of breast: Secondary | ICD-10-CM

## 2024-08-23 LAB — COMPREHENSIVE METABOLIC PANEL WITH GFR
AG Ratio: 1.5 (calc) (ref 1.0–2.5)
ALT: 13 U/L (ref 6–29)
AST: 14 U/L (ref 10–35)
Albumin: 4.2 g/dL (ref 3.6–5.1)
Alkaline phosphatase (APISO): 88 U/L (ref 37–153)
BUN: 8 mg/dL (ref 7–25)
CO2: 29 mmol/L (ref 20–32)
Calcium: 9.5 mg/dL (ref 8.6–10.4)
Chloride: 105 mmol/L (ref 98–110)
Creat: 0.73 mg/dL (ref 0.50–1.03)
Globulin: 2.8 g/dL (ref 1.9–3.7)
Glucose, Bld: 103 mg/dL (ref 65–139)
Potassium: 4 mmol/L (ref 3.5–5.3)
Sodium: 142 mmol/L (ref 135–146)
Total Bilirubin: 0.4 mg/dL (ref 0.2–1.2)
Total Protein: 7 g/dL (ref 6.1–8.1)
eGFR: 95 mL/min/1.73m2

## 2024-08-23 LAB — LIPID PANEL
Cholesterol: 116 mg/dL
HDL: 38 mg/dL — ABNORMAL LOW
LDL Cholesterol (Calc): 56 mg/dL
Non-HDL Cholesterol (Calc): 78 mg/dL
Total CHOL/HDL Ratio: 3.1 (calc)
Triglycerides: 134 mg/dL

## 2024-08-23 LAB — CBC
HCT: 37.7 % (ref 35.9–46.0)
Hemoglobin: 11.4 g/dL — ABNORMAL LOW (ref 11.7–15.5)
MCH: 21.9 pg — ABNORMAL LOW (ref 27.0–33.0)
MCHC: 30.2 g/dL — ABNORMAL LOW (ref 31.6–35.4)
MCV: 72.4 fL — ABNORMAL LOW (ref 81.4–101.7)
MPV: 11.1 fL (ref 7.5–12.5)
Platelets: 399 Thousand/uL (ref 140–400)
RBC: 5.21 Million/uL — ABNORMAL HIGH (ref 3.80–5.10)
RDW: 15.7 % — ABNORMAL HIGH (ref 11.0–15.0)
WBC: 9.1 Thousand/uL (ref 3.8–10.8)

## 2024-08-23 LAB — HEMOGLOBIN A1C
Hgb A1c MFr Bld: 5.8 % — ABNORMAL HIGH
Mean Plasma Glucose: 120 mg/dL
eAG (mmol/L): 6.6 mmol/L

## 2024-08-23 MED ORDER — ATORVASTATIN CALCIUM 40 MG PO TABS: 80.0000 mg | ORAL_TABLET | Freq: Every day | ORAL | 1 refills | Status: AC

## 2024-08-23 NOTE — Patient Instructions (Signed)
 Health Maintenance for Postmenopausal Women Menopause is a normal process in which your ability to get pregnant comes to an end. This process happens slowly over many months or years, usually between the ages of 76 and 38. Menopause is complete when you have missed your menstrual period for 12 months. It is important to talk with your health care provider about some of the most common conditions that affect women after menopause (postmenopausal women). These include heart disease, cancer, and bone loss (osteoporosis). Adopting a healthy lifestyle and getting preventive care can help to promote your health and wellness. The actions you take can also lower your chances of developing some of these common conditions. What are the signs and symptoms of menopause? During menopause, you may have the following symptoms: Hot flashes. These can be moderate or severe. Night sweats. Decrease in sex drive. Mood swings. Headaches. Tiredness (fatigue). Irritability. Memory problems. Problems falling asleep or staying asleep. Talk with your health care provider about treatment options for your symptoms. Do I need hormone replacement therapy? Hormone replacement therapy is effective in treating symptoms that are caused by menopause, such as hot flashes and night sweats. Hormone replacement carries certain risks, especially as you become older. If you are thinking about using estrogen or estrogen with progestin, discuss the benefits and risks with your health care provider. How can I reduce my risk for heart disease and stroke? The risk of heart disease, heart attack, and stroke increases as you age. One of the causes may be a change in the body's hormones during menopause. This can affect how your body uses dietary fats, triglycerides, and cholesterol. Heart attack and stroke are medical emergencies. There are many things that you can do to help prevent heart disease and stroke. Watch your blood pressure High  blood pressure causes heart disease and increases the risk of stroke. This is more likely to develop in people who have high blood pressure readings or are overweight. Have your blood pressure checked: Every 3-5 years if you are 32-23 years of age. Every year if you are 31 years old or older. Eat a healthy diet  Eat a diet that includes plenty of vegetables, fruits, low-fat dairy products, and lean protein. Do not eat a lot of foods that are high in solid fats, added sugars, or sodium. Get regular exercise Get regular exercise. This is one of the most important things you can do for your health. Most adults should: Try to exercise for at least 150 minutes each week. The exercise should increase your heart rate and make you sweat (moderate-intensity exercise). Try to do strengthening exercises at least twice each week. Do these in addition to the moderate-intensity exercise. Spend less time sitting. Even light physical activity can be beneficial. Other tips Work with your health care provider to achieve or maintain a healthy weight. Do not use any products that contain nicotine or tobacco. These products include cigarettes, chewing tobacco, and vaping devices, such as e-cigarettes. If you need help quitting, ask your health care provider. Know your numbers. Ask your health care provider to check your cholesterol and your blood sugar (glucose). Continue to have your blood tested as directed by your health care provider. Do I need screening for cancer? Depending on your health history and family history, you may need to have cancer screenings at different stages of your life. This may include screening for: Breast cancer. Cervical cancer. Lung cancer. Colorectal cancer. What is my risk for osteoporosis? After menopause, you may be  at increased risk for osteoporosis. Osteoporosis is a condition in which bone destruction happens more quickly than new bone creation. To help prevent osteoporosis or  the bone fractures that can happen because of osteoporosis, you may take the following actions: If you are 24-54 years old, get at least 1,000 mg of calcium and at least 600 international units (IU) of vitamin D  per day. If you are older than age 75 but younger than age 30, get at least 1,200 mg of calcium and at least 600 international units (IU) of vitamin D  per day. If you are older than age 8, get at least 1,200 mg of calcium and at least 800 international units (IU) of vitamin D  per day. Smoking and drinking excessive alcohol increase the risk of osteoporosis. Eat foods that are rich in calcium and vitamin D , and do weight-bearing exercises several times each week as directed by your health care provider. How does menopause affect my mental health? Depression may occur at any age, but it is more common as you become older. Common symptoms of depression include: Feeling depressed. Changes in sleep patterns. Changes in appetite or eating patterns. Feeling an overall lack of motivation or enjoyment of activities that you previously enjoyed. Frequent crying spells. Talk with your health care provider if you think that you are experiencing any of these symptoms. General instructions See your health care provider for regular wellness exams and vaccines. This may include: Scheduling regular health, dental, and eye exams. Getting and maintaining your vaccines. These include: Influenza vaccine. Get this vaccine each year before the flu season begins. Pneumonia vaccine. Shingles vaccine. Tetanus, diphtheria, and pertussis (Tdap) booster vaccine. Your health care provider may also recommend other immunizations. Tell your health care provider if you have ever been abused or do not feel safe at home. Summary Menopause is a normal process in which your ability to get pregnant comes to an end. This condition causes hot flashes, night sweats, decreased interest in sex, mood swings, headaches, or lack  of sleep. Treatment for this condition may include hormone replacement therapy. Take actions to keep yourself healthy, including exercising regularly, eating a healthy diet, watching your weight, and checking your blood pressure and blood sugar levels. Get screened for cancer and depression. Make sure that you are up to date with all your vaccines. This information is not intended to replace advice given to you by your health care provider. Make sure you discuss any questions you have with your health care provider. Document Revised: 12/17/2020 Document Reviewed: 12/17/2020 Elsevier Patient Education  2024 [employer].

## 2024-08-23 NOTE — Assessment & Plan Note (Signed)
 Encouraged diet and exercise for weight loss ?

## 2024-08-23 NOTE — Progress Notes (Signed)
 "  Subjective:    Patient ID: Courtney Carrillo, female    DOB: 07-Mar-1966, 59 y.o.   MRN: [id number]  HPI  Patient presents to clinic today for her annual exam.  Flu: 04/2024 Tetanus: 06/2024 COVID:  x3 Prevnar 20 : 06/2023 Shingrix: 04/2024, 06/2024 Pap smear: 12/2020 Mammogram: 02/2023 Bone density: never Colon screening: 01/2021 Vision screening: annually Dentist: as needed, dentures  Diet: She does eat meat. She consumes fruits and veggies. She does eat some fried foods. She drinks mostly soda, tea and water. Exercise: Walking  Review of Systems     Past Medical History:  Diagnosis Date   Anemia    2015   Anxiety    COPD (chronic obstructive pulmonary disease) (HCC)    Depression    Headache    H/O   Hyperlipidemia    Hypertension    Sleep apnea    CPAP    Current Outpatient Medications  Medication Sig Dispense Refill   albuterol  (PROVENTIL ) (2.5 MG/3ML) 0.083% nebulizer solution Take 3 mLs (2.5 mg total) by nebulization every 4 (four) hours as needed for wheezing or shortness of breath. 75 mL 2   amLODipine  (NORVASC ) 10 MG tablet Take 1 tablet (10 mg total) by mouth daily. 90 tablet 0   Ascorbic Acid (VITAMIN C) 1000 MG tablet Take 1,000 mg by mouth daily.     aspirin  EC 81 MG tablet Take 1 tablet (81 mg total) by mouth daily. Swallow whole. 90 tablet 1   atorvastatin  (LIPITOR) 40 MG tablet Take 1 tablet (40 mg total) by mouth daily. 90 tablet 0   benztropine  (COGENTIN ) 1 MG tablet Take 1 mg by mouth every morning.     busPIRone  (BUSPAR ) 30 MG tablet Take 30 mg by mouth 2 (two) times daily.     celecoxib  (CELEBREX ) 50 MG capsule Take 1 capsule (50 mg total) by mouth 2 (two) times daily. 180 capsule 1   Cholecalciferol (VITAMIN D-3) 25 MCG (1000 UT) CAPS Take by mouth.     COMBIVENT RESPIMAT 20-100 MCG/ACT AERS respimat Inhale 2 puffs into the lungs every 6 (six) hours as needed for wheezing or shortness of breath.     Cyanocobalamin (VITAMIN B 12 PO) Take by mouth.      doxepin  (SINEQUAN ) 50 MG capsule Take 50 mg by mouth at bedtime.     Dupilumab 300 MG/2ML SOAJ Inject into the skin.     fluticasone-salmeterol (ADVAIR) 250-50 MCG/ACT AEPB SMARTSIG:1 Puff(s) Via Inhaler Every 12 Hours     loratadine (CLARITIN) 10 MG tablet Take 10 mg by mouth daily.     methocarbamol  (ROBAXIN ) 500 MG tablet Take 1 tablet (500 mg total) by mouth every 8 (eight) hours as needed. 30 tablet 0   predniSONE  (DELTASONE ) 10 MG tablet Take 3 tabs on days 1-3, 2 tabs on days 4-6, 1 tab on days 7-9 (Patient not taking: Reported on 06/21/2024) 18 tablet 0   VRAYLAR  6 MG CAPS Take 1 capsule by mouth daily.     ZEPBOUND 2.5 MG/0.5ML Pen Inject 2.5 mg into the skin once a week.     No current facility-administered medications for this visit.    Allergies  Allergen Reactions   Lisinopril Cough   Losartan Cough   Other Other (See Comments)    Perfumes : Congestion    Family History  Problem Relation Age of Onset   Diabetes Mother    Breast cancer Sister     Social History   Socioeconomic History  Marital status: Single    Spouse name: Not on file   Number of children: Not on file   Years of education: Not on file   Highest education level: Not on file  Occupational History   Not on file  Tobacco Use   Smoking status: Former    Current packs/day: 0.50    Average packs/day: 0.5 packs/day for 36.3 years (18.2 ttl pk-yrs)    Types: Cigarettes    Start date: 09/12/2021   Smokeless tobacco: Never  Vaping Use   Vaping status: Never Used  Substance and Sexual Activity   Alcohol use: No   Drug use: Yes    Frequency: 7.0 times per week    Types: Marijuana   Sexual activity: Not on file  Other Topics Concern   Not on file  Social History Narrative   Not on file   Social Drivers of Health   Tobacco Use: Medium Risk (07/27/2024)   Received from [facility]   Patient History    Smoking Tobacco Use: Former    Smokeless Tobacco Use: Never    Passive  Exposure: Not on file  Financial Resource Strain: Low Risk  (07/19/2024)   Received from [facility]   Overall Financial Resource Strain (CARDIA)    Difficulty of Paying Living Expenses: Not hard at all  Food Insecurity: No Food Insecurity (07/19/2024)   Received from [facility]   Epic    Within the past 12 months, you worried that your food would run out before you got the money to buy more.: Never true    Within the past 12 months, the food you bought just didn't last and you didn't have money to get more.: Never true  Transportation Needs: No Transportation Needs (07/19/2024)   Received from [facility] - Transportation    In the past 12 months, has lack of transportation kept you from medical appointments or from getting medications?: No    Lack of Transportation (Non-Medical): No  Physical Activity: Inactive (01/01/2024)   Exercise Vital Sign    Days of Exercise per Week: 0 days    Minutes of Exercise per Session: 0 min  Stress: Stress Concern Present (02/29/2024)   [employer] of [employer]ational [employer] - Occupational Stress Questionnaire    Feeling of Stress: To some extent  Social Connections: Unknown (02/29/2024)   Social Connection and Isolation Panel    Frequency of Communication with Friends and Family: More than three times a week    Frequency of Social Gatherings with Friends and Family: More than three times a week    Attends Religious Services: More than 4 times per year    Active Member of [employer] or Organizations: No    Attends Banker Meetings: Never    Marital Status: Not on file  Recent Concern: Social Connections - Moderately Isolated (01/01/2024)   Social Connection and Isolation Panel    Frequency of Communication with Friends and Family: More than three times a week    Frequency of Social Gatherings with Friends and Family: Once a week    Attends Religious Services: More than 4 times per  year    Active Member of [employer] or Organizations: No    Attends Banker Meetings: Never    Marital Status: Never married  Intimate Partner Violence: Not At Risk (02/29/2024)   Epic    Fear of Current or Ex-Partner: No    Emotionally Abused:  No    Physically Abused: No    Sexually Abused: No  Depression (PHQ2-9): Low Risk (06/21/2024)   Depression (PHQ2-9)    PHQ-2 Score: 0  Recent Concern: Depression (PHQ2-9) - High Risk (03/29/2024)   Depression (PHQ2-9)    PHQ-2 Score: 17  Alcohol Screen: Low Risk (01/01/2024)   Alcohol Screen    Last Alcohol Screening Score (AUDIT): 0  Housing: Low Risk  (07/19/2024)   Received from [facility]   Epic    In the last 12 months, was there a time when you were not able to pay the mortgage or rent on time?: No    In the past 12 months, how many times have you moved where you were living?: 0    At any time in the past 12 months, were you homeless or living in a shelter (including now)?: No  Utilities: Not At Risk (07/19/2024)   Received from [facility]   Epic    In the past 12 months has the electric, gas, oil, or water company threatened to shut off services in your home?: No  Health Literacy: Adequate Health Literacy (02/29/2024)   B1300 Health Literacy    Frequency of need for help with medical instructions: Never     Constitutional: Denies fever, malaise, fatigue, headache or abrupt weight changes.  HEENT: Denies eye pain, eye redness, ear pain, ringing in the ears, wax buildup, runny nose, nasal congestion, bloody nose, or sore throat. Respiratory: Denies difficulty breathing, shortness of breath, cough or sputum production.   Cardiovascular: Denies chest pain, chest tightness, palpitations or swelling in the hands or feet.  Gastrointestinal: Denies abdominal pain, bloating, constipation, diarrhea or blood in the stool.  GU: Denies urgency, frequency, pain with urination, burning sensation, blood  in urine, odor or discharge. Musculoskeletal: Patient reports chronic back/right shoulder pain.  Denies decrease in range of motion, difficulty with gait, or joint swelling.  Skin: Denies redness, rashes, lesions or ulcercations.  Neurological: Denies dizziness, difficulty with memory, difficulty with speech or problems with balance and coordination.  Psych: Patient has a history of anxiety and depression.  Denies SI/HI.  No other specific complaints in a complete review of systems (except as listed in HPI above).  Objective:   Physical Exam BP 136/84 (BP Location: Left Arm, Patient Position: Sitting, Cuff Size: Large)   Ht 5' 5 (1.651 m)   Wt 228 lb 4 oz (103.5 kg)   BMI 37.98 kg/m     Wt Readings from Last 3 Encounters:  05/25/24 244 lb (110.7 kg)  02/29/24 242 lb 9.6 oz (110 kg)  08/19/23 238 lb (108 kg)    General: Appears her stated age, obese, in NAD. Skin: Warm, dry and intact.  HEENT: Head: normal shape and size; Eyes: sclera white, no icterus, conjunctiva pink, PERRLA and EOMs intact;  Neck:  Neck supple, trachea midline. No masses, lumps or thyromegaly present.  Cardiovascular: Normal rate and rhythm. S1,S2 noted.  No murmur, rubs or gallops noted. No JVD or BLE edema. No carotid bruits noted. Pulmonary/Chest: Normal effort and positive vesicular breath sounds. No respiratory distress. No wheezes, rales or ronchi noted.  Abdomen:  Normal bowel sounds.  Musculoskeletal: Decreased internal and external rotation of the right shoulder.  Pain with palpation over the anterior right shoulder.  Positive drop can test on the right.  Strength 5/5 LUE, 4/5 RUE.  Strength 5/5 BLE.  No difficulty with gait.  Neurological: Alert and oriented. Cranial nerves II-XII  grossly intact. Coordination normal.  Psychiatric: Mood and affect normal. Behavior is normal. Judgment and thought content normal.     BMET    Component Value Date/Time   NA 143 02/29/2024 0918   K 3.7 02/29/2024 0918    CL 104 02/29/2024 0918   CO2 31 02/29/2024 0918   GLUCOSE 138 (H) 02/29/2024 0918   BUN 7 02/29/2024 0918   CREATININE 0.82 02/29/2024 0918   CALCIUM  9.8 02/29/2024 0918   GFRNONAA >60 07/10/2023 1443   GFRNONAA 74 08/17/2020 0814   GFRAA 86 08/17/2020 0814    Lipid Panel     Component Value Date/Time   CHOL 151 02/29/2024 0918   TRIG 103 02/29/2024 0918   HDL 49 (L) 02/29/2024 0918   CHOLHDL 3.1 02/29/2024 0918   VLDL 29 06/22/2023 0846   LDLCALC 82 02/29/2024 0918    CBC    Component Value Date/Time   WBC 8.9 02/29/2024 0918   RBC 5.35 (H) 02/29/2024 0918   HGB 11.9 02/29/2024 0918   HCT 40.0 02/29/2024 0918   PLT 359 02/29/2024 0918   MCV 74.8 (L) 02/29/2024 0918   MCH 22.2 (L) 02/29/2024 0918   MCHC 29.8 (L) 02/29/2024 0918   RDW 16.0 (H) 02/29/2024 0918   LYMPHSABS 2.2 06/19/2023 1039   MONOABS 0.6 06/19/2023 1039   EOSABS 0.6 (H) 06/19/2023 1039   BASOSABS 0.1 06/19/2023 1039    Hgb A1C Lab Results  Component Value Date   HGBA1C 6.3 (H) 02/29/2024            Assessment & Plan:   Preventative Health Maintenance:  Flu shot UTD Tetanus UTD Encouraged her to get her COVID booster Prevnar 20  UTD Shingrix UTD Pap smear UTD Mammogram and bone density ordered-she will call to schedule Referral to GI for screening colonoscopy Encouraged her to consume a balanced diet and exercise regimen Advised her to see an eye doctor and dentist annually We will check CBC, c-Met, lipid and A1c today  RTC in 6 months, follow-up chronic conditions Angeline Laura, NP  "

## 2024-08-24 ENCOUNTER — Ambulatory Visit: Payer: Self-pay | Admitting: Internal Medicine

## 2024-08-25 NOTE — Assessment & Plan Note (Signed)
 Associated with morbid obesity C-Met and lipid profile today Encouraged low-fat diet

## 2024-09-20 ENCOUNTER — Other Ambulatory Visit

## 2024-09-20 ENCOUNTER — Encounter

## 2025-01-13 ENCOUNTER — Ambulatory Visit

## 2025-01-18 ENCOUNTER — Ambulatory Visit

## 2025-02-21 ENCOUNTER — Ambulatory Visit: Admitting: Internal Medicine
